# Patient Record
Sex: Male | Born: 1960 | Race: White | Hispanic: No | Marital: Married | Smoking: Never smoker
Health system: Southern US, Community
[De-identification: ages and names within clinical notes are randomized; demographics above are authoritative.]

## PROBLEM LIST (undated history)

## (undated) DIAGNOSIS — R079 Chest pain, unspecified: Secondary | ICD-10-CM

## (undated) DIAGNOSIS — I493 Ventricular premature depolarization: Secondary | ICD-10-CM

## (undated) DIAGNOSIS — E669 Obesity, unspecified: Secondary | ICD-10-CM

## (undated) DIAGNOSIS — R7303 Prediabetes: Secondary | ICD-10-CM

## (undated) DIAGNOSIS — K219 Gastro-esophageal reflux disease without esophagitis: Secondary | ICD-10-CM

## (undated) DIAGNOSIS — Z87442 Personal history of urinary calculi: Secondary | ICD-10-CM

## (undated) DIAGNOSIS — G4730 Sleep apnea, unspecified: Secondary | ICD-10-CM

## (undated) HISTORY — DX: Ventricular premature depolarization: I49.3

## (undated) HISTORY — DX: Sleep apnea, unspecified: G47.30

## (undated) HISTORY — DX: Obesity, unspecified: E66.9

## (undated) HISTORY — DX: Chest pain, unspecified: R07.9

## (undated) HISTORY — PX: OTHER SURGICAL HISTORY: SHX169

## (undated) HISTORY — PX: TONSILLECTOMY: SUR1361

## (undated) HISTORY — DX: Prediabetes: R73.03

## (undated) HISTORY — PX: WISDOM TOOTH EXTRACTION: SHX21

---

## 2008-04-30 DIAGNOSIS — C801 Malignant (primary) neoplasm, unspecified: Secondary | ICD-10-CM

## 2008-04-30 HISTORY — DX: Malignant (primary) neoplasm, unspecified: C80.1

## 2015-10-25 ENCOUNTER — Emergency Department (HOSPITAL_BASED_OUTPATIENT_CLINIC_OR_DEPARTMENT_OTHER)
Admission: EM | Admit: 2015-10-25 | Discharge: 2015-10-25 | Disposition: A | Discharge status: Home / Self Care | Payer: 59 | Attending: Emergency Medicine | Admitting: Emergency Medicine

## 2015-10-25 ENCOUNTER — Emergency Department (HOSPITAL_BASED_OUTPATIENT_CLINIC_OR_DEPARTMENT_OTHER): Payer: 59

## 2015-10-25 ENCOUNTER — Encounter (HOSPITAL_BASED_OUTPATIENT_CLINIC_OR_DEPARTMENT_OTHER): Payer: Self-pay | Admitting: *Deleted

## 2015-10-25 DIAGNOSIS — N200 Calculus of kidney: Secondary | ICD-10-CM | POA: Insufficient documentation

## 2015-10-25 DIAGNOSIS — Z79899 Other long term (current) drug therapy: Secondary | ICD-10-CM | POA: Diagnosis not present

## 2015-10-25 DIAGNOSIS — R109 Unspecified abdominal pain: Secondary | ICD-10-CM | POA: Diagnosis present

## 2015-10-25 HISTORY — DX: Gastro-esophageal reflux disease without esophagitis: K21.9

## 2015-10-25 LAB — URINALYSIS, ROUTINE W REFLEX MICROSCOPIC
BILIRUBIN URINE: NEGATIVE
GLUCOSE, UA: NEGATIVE mg/dL
KETONES UR: NEGATIVE mg/dL
LEUKOCYTES UA: NEGATIVE
NITRITE: NEGATIVE
PH: 6 (ref 5.0–8.0)
PROTEIN: NEGATIVE mg/dL
Specific Gravity, Urine: 1.023 (ref 1.005–1.030)

## 2015-10-25 LAB — URINE MICROSCOPIC-ADD ON

## 2015-10-25 MED ORDER — ONDANSETRON HCL 4 MG/2ML IJ SOLN
4.0000 mg | Freq: Once | INTRAMUSCULAR | Status: AC
Start: 2015-10-25 — End: 2015-10-25
  Administered 2015-10-25: 4 mg via INTRAVENOUS
  Filled 2015-10-25: qty 2

## 2015-10-25 MED ORDER — NAPROXEN 500 MG PO TABS: 500.0000 mg | ORAL_TABLET | Freq: Two times a day (BID) | ORAL | Status: DC

## 2015-10-25 MED ORDER — SODIUM CHLORIDE 0.9 % IV BOLUS (SEPSIS)
1000.0000 mL | Freq: Once | INTRAVENOUS | Status: AC
  Administered 2015-10-25: 1000 mL via INTRAVENOUS

## 2015-10-25 MED ORDER — KETOROLAC TROMETHAMINE 30 MG/ML IJ SOLN
30.0000 mg | Freq: Once | INTRAMUSCULAR | Status: AC
  Administered 2015-10-25: 30 mg via INTRAVENOUS
  Filled 2015-10-25: qty 1

## 2015-10-25 MED ORDER — ONDANSETRON 4 MG PO TBDP: 4.0000 mg | ORAL_TABLET | Freq: Three times a day (TID) | ORAL | Status: DC | PRN

## 2015-10-25 MED ORDER — HYDROCODONE-ACETAMINOPHEN 5-325 MG PO TABS: 1.0000 | ORAL_TABLET | Freq: Four times a day (QID) | ORAL | Status: DC | PRN

## 2015-10-25 NOTE — ED Provider Notes (Signed)
CSN: [id number]     Arrival date & time 10/25/15  1705 History   First MD Initiated Contact with Patient 10/25/15 1720     No chief complaint on file.   (Consider location/radiation/quality/duration/timing/severity/associated sxs/prior Treatment) The history is provided by the patient and medical records. No language interpreter was used.   Keith Franklin is a 55 y.o. male  with a PMH of GERD who presents to the Emergency Department complaining of acute onset of crampy waxing-waning left sided flank pain that began at 13:00 today. Admits to nausea, no vomiting. No fever, back pain, dysuria, abdominal pain. No hx of similar. No medications taken prior to arrival. Patient states no matter which position he tries, he cannot get comfortable.   Past Medical History  Diagnosis Date  . GERD (gastroesophageal reflux disease)    History reviewed. No pertinent past surgical history. No family history on file. Social History  Substance Use Topics  . Smoking status: Never Smoker   . Smokeless tobacco: None  . Alcohol Use: None    Review of Systems  Constitutional: Negative for fever and chills.  HENT: Negative for congestion.   Eyes: Negative for visual disturbance.  Respiratory: Negative for cough and shortness of breath.   Cardiovascular: Negative.   Gastrointestinal: Negative for nausea, vomiting and abdominal pain.  Genitourinary: Positive for flank pain. Negative for dysuria, hematuria, discharge, penile swelling, scrotal swelling, penile pain and testicular pain.  Musculoskeletal: Negative for back pain and neck pain.  Skin: Negative for rash.  Neurological: Negative for headaches.      Allergies  Review of patient's allergies indicates no known allergies.  Home Medications   Prior to Admission medications   Medication Sig Start Date End Date Taking? Authorizing Provider  famotidine (PEPCID) 10 MG tablet Take 10 mg by mouth 2 (two) times daily.   Yes Historical Provider, MD   HYDROcodone-acetaminophen (NORCO/VICODIN) 5-325 MG tablet Take 1 tablet by mouth every 6 (six) hours as needed for severe pain. 10/25/15   Ozella Almond Nikkie Liming, PA-C  naproxen (NAPROSYN) 500 MG tablet Take 1 tablet (500 mg total) by mouth 2 (two) times daily. 10/25/15   Ozella Almond Altamese Deguire, PA-C  ondansetron (ZOFRAN ODT) 4 MG disintegrating tablet Take 1 tablet (4 mg total) by mouth every 8 (eight) hours as needed for nausea or vomiting. 10/25/15   Ozella Almond Annalicia Renfrew, PA-C   BP 127/80 mmHg  Pulse 77  Temp(Src) 97.8 F (36.6 C) (Oral)  Resp 18  Ht 5\' 8"  (1.727 m)  Wt 106.595 kg  BMI 35.74 kg/m2  SpO2 97% Physical Exam  Constitutional: He is oriented to person, place, and time. He appears well-developed and well-nourished.  Alert, pacing around the room, NAD.   HENT:  Head: Normocephalic and atraumatic.  Cardiovascular: Normal rate, regular rhythm, normal heart sounds and intact distal pulses.  Exam reveals no gallop and no friction rub.   No murmur heard. Pulmonary/Chest: Effort normal and breath sounds normal. No respiratory distress. He has no wheezes. He has no rales. He exhibits no tenderness.  Abdominal: Soft. Bowel sounds are normal. He exhibits no distension and no mass. There is no tenderness. There is no rebound and no guarding.  No CVA tenderness. TTP of left flank.  Musculoskeletal: He exhibits no edema.  Neurological: He is alert and oriented to person, place, and time.  Skin: Skin is warm and dry.  Nursing note and vitals reviewed.   ED Course  Procedures (including critical care time) Labs Review Labs Reviewed  URINALYSIS, ROUTINE W REFLEX MICROSCOPIC (NOT AT [facility]) - Abnormal; Notable for the following:    Hgb urine dipstick LARGE (*)    All other components within normal limits  URINE MICROSCOPIC-ADD ON - Abnormal; Notable for the following:    Squamous Epithelial / LPF 0-5 (*)    Bacteria, UA FEW (*)    All other components within normal limits    Imaging  Review Ct Renal Stone Study  10/25/2015  CLINICAL DATA:  Left flank pain starting 13 p.m. today EXAM: CT ABDOMEN AND PELVIS WITHOUT CONTRAST TECHNIQUE: Multidetector CT imaging of the abdomen and pelvis was performed following the standard protocol without IV contrast. COMPARISON:  None. FINDINGS: Lower chest:  Lung bases are unremarkable. Hepatobiliary: Unenhanced liver shows no biliary ductal dilatation. Probable cyst right hepatic lobe measures 9 mm. Gallbladder is contracted without evidence of calcified gallstones. Pancreas: Unenhanced pancreas is unremarkable. Spleen: Unenhanced spleen is unremarkable. Adrenals/Urinary Tract: No adrenal gland mass. There is mild left hydronephrosis and left hydroureter. No nephrolithiasis. No proximal ureteral calcified calculi bilaterally. Axial image 61 there is 3 mm calcified obstructive calculus in left UVJ. The urinary bladder is under distended. Stomach/Bowel: No small bowel obstruction. Normal appendix is noted in axial image 55. No pericecal inflammation. Some colonic stool noted in right colon and cecum. No colitis or diverticulitis. Descending colon and sigmoid colon is empty collapsed. Vascular/Lymphatic: No retroperitoneal or mesenteric adenopathy. No aortic aneurysm. Reproductive: Prostate gland and seminal vesicles are unremarkable. Other: There is tiny umbilical hernia containing fat without evidence of acute complication. Musculoskeletal: No destructive bony lesions are noted. Sagittal images of the spine shows mild degenerative changes lower thoracic lumbar spine. IMPRESSION: 1. Mild left hydronephrosis and left hydroureter. 2. There is 3 mm calcified obstructive calculus  noted in left UVJ. 3. Normal appendix.  No pericecal inflammation. 4. No nephrolithiasis.  No right ureteral calculi. Electronically Signed   By: Lahoma Crocker M.D.   On: 10/25/2015 18:33   I have personally reviewed and evaluated these images and lab results as part of my medical  decision-making.   EKG Interpretation None      MDM   Final diagnoses:  Kidney stone   Keith Franklin presents to ED for acute onset of left flank pain. Benign abdominal exam. Afebrile and hemodynamically stable. Tenderness to palpation of left flank. Symptoms appear consistent with kidney stone. CT and urine ordered. IV fluids, Toradol, Zofran given. UA shows large blood, no concern for infection. CT study shows 3 mm stone in left UVJ.  Patient reevaluated and feels much improved after medication. Informed of CT results. Symptomatic home care instructions discussed. Rx for Naproxen, zofran, and short course pain meds given. PCP follow up recommended. Reasons to return to the ED were also discussed and all questions were answered.  [facility] Amrit Erck, PA-C 10/25/15 2004  Deno Etienne, DO 10/25/15 [phone]

## 2015-10-25 NOTE — Discharge Instructions (Signed)
You have been diagnosed with kidney stones.  Use your pain medication as prescribed and do not operate heavy machinery while on this medication. Note that your pain medication contains Acetaminophen (Tylenol), therefore it is not recommended to take additional Tylenol while on your pain medication. Continue to drink fluids to help you pass the stones. Use Zofran for nausea as directed. Increase hydration. Follow up with ypur primary care physician in regards to your hospital visit.  Return to the ED immediately if you develop fever that persists > 101, uncontrolled pain or vomiting, or other concerns.  Read the instructions below to learn more about kidney stones.    Kidney Stones Kidney stones (ureteral lithiasis) are solid masses that form inside your kidneys. The intense pain is caused by the stone moving through the kidney, ureter, bladder, and urethra (urinary tract). When the stone moves, the ureter starts to spasm around the stone. The stone is usually passed in the urine.   HOME CARE  Drink enough fluids to keep your urine clear or pale yellow. This helps to get the stone out.   Only take medicine as told by your doctor.   Follow up with your doctor as told.   GET HELP RIGHT AWAY IF:   Your pain does not get better with medicine.   You have a fever.   Your pain increases and gets worse over 18 hours.   You have new belly (abdominal) pain.   You feel faint or pass out.   MAKE SURE YOU:   Understand these instructions.   Will watch your condition.   Will get help right away if you are not doing well or get worse.

## 2015-10-25 NOTE — ED Notes (Signed)
C/o right flank pain that is intense since 1300 felt urgency to urinate. States he can urinate but is not urinating as much.

## 2019-07-30 DIAGNOSIS — U071 COVID-19: Secondary | ICD-10-CM

## 2019-07-30 HISTORY — DX: COVID-19: U07.1

## 2019-08-09 DIAGNOSIS — J1282 Pneumonia due to coronavirus disease 2019: Secondary | ICD-10-CM | POA: Insufficient documentation

## 2019-08-10 DIAGNOSIS — Z6838 Body mass index (BMI) 38.0-38.9, adult: Secondary | ICD-10-CM | POA: Diagnosis present

## 2020-09-30 ENCOUNTER — Emergency Department (HOSPITAL_COMMUNITY): Payer: 59

## 2020-09-30 ENCOUNTER — Encounter (HOSPITAL_COMMUNITY): Payer: Self-pay

## 2020-09-30 ENCOUNTER — Encounter (HOSPITAL_COMMUNITY)
Admission: EM | Disposition: A | Discharge status: Home / Self Care | Payer: Self-pay | Source: Home / Self Care | Attending: Thoracic Surgery (Cardiothoracic Vascular Surgery)

## 2020-09-30 ENCOUNTER — Other Ambulatory Visit: Payer: Self-pay

## 2020-09-30 ENCOUNTER — Inpatient Hospital Stay (HOSPITAL_COMMUNITY)
Admission: EM | Admit: 2020-09-30 | Discharge: 2020-10-07 | DRG: 231 | Disposition: A | Discharge status: Home / Self Care | Payer: 59 | Attending: Thoracic Surgery (Cardiothoracic Vascular Surgery) | Admitting: Thoracic Surgery (Cardiothoracic Vascular Surgery)

## 2020-09-30 DIAGNOSIS — G4440 Drug-induced headache, not elsewhere classified, not intractable: Secondary | ICD-10-CM | POA: Diagnosis not present

## 2020-09-30 DIAGNOSIS — Z79899 Other long term (current) drug therapy: Secondary | ICD-10-CM

## 2020-09-30 DIAGNOSIS — Z85828 Personal history of other malignant neoplasm of skin: Secondary | ICD-10-CM | POA: Diagnosis not present

## 2020-09-30 DIAGNOSIS — K219 Gastro-esophageal reflux disease without esophagitis: Secondary | ICD-10-CM | POA: Diagnosis present

## 2020-09-30 DIAGNOSIS — I462 Cardiac arrest due to underlying cardiac condition: Secondary | ICD-10-CM | POA: Diagnosis not present

## 2020-09-30 DIAGNOSIS — Z8249 Family history of ischemic heart disease and other diseases of the circulatory system: Secondary | ICD-10-CM

## 2020-09-30 DIAGNOSIS — I213 ST elevation (STEMI) myocardial infarction of unspecified site: Secondary | ICD-10-CM

## 2020-09-30 DIAGNOSIS — I2121 ST elevation (STEMI) myocardial infarction involving left circumflex coronary artery: Secondary | ICD-10-CM | POA: Diagnosis present

## 2020-09-30 DIAGNOSIS — I472 Ventricular tachycardia: Secondary | ICD-10-CM | POA: Diagnosis not present

## 2020-09-30 DIAGNOSIS — Z6841 Body Mass Index (BMI) 40.0 and over, adult: Secondary | ICD-10-CM

## 2020-09-30 DIAGNOSIS — R0602 Shortness of breath: Secondary | ICD-10-CM

## 2020-09-30 DIAGNOSIS — Z7982 Long term (current) use of aspirin: Secondary | ICD-10-CM | POA: Diagnosis not present

## 2020-09-30 DIAGNOSIS — Z8616 Personal history of COVID-19: Secondary | ICD-10-CM

## 2020-09-30 DIAGNOSIS — E785 Hyperlipidemia, unspecified: Secondary | ICD-10-CM | POA: Diagnosis present

## 2020-09-30 DIAGNOSIS — R739 Hyperglycemia, unspecified: Secondary | ICD-10-CM | POA: Diagnosis present

## 2020-09-30 DIAGNOSIS — I150 Renovascular hypertension: Secondary | ICD-10-CM

## 2020-09-30 DIAGNOSIS — I2510 Atherosclerotic heart disease of native coronary artery without angina pectoris: Secondary | ICD-10-CM

## 2020-09-30 DIAGNOSIS — Z809 Family history of malignant neoplasm, unspecified: Secondary | ICD-10-CM

## 2020-09-30 DIAGNOSIS — I252 Old myocardial infarction: Secondary | ICD-10-CM

## 2020-09-30 DIAGNOSIS — Z6838 Body mass index (BMI) 38.0-38.9, adult: Secondary | ICD-10-CM | POA: Diagnosis present

## 2020-09-30 DIAGNOSIS — D62 Acute posthemorrhagic anemia: Secondary | ICD-10-CM | POA: Diagnosis not present

## 2020-09-30 DIAGNOSIS — Z9861 Coronary angioplasty status: Secondary | ICD-10-CM

## 2020-09-30 DIAGNOSIS — E8770 Fluid overload, unspecified: Secondary | ICD-10-CM | POA: Diagnosis not present

## 2020-09-30 DIAGNOSIS — E669 Obesity, unspecified: Secondary | ICD-10-CM | POA: Diagnosis present

## 2020-09-30 DIAGNOSIS — T463X5A Adverse effect of coronary vasodilators, initial encounter: Secondary | ICD-10-CM | POA: Diagnosis not present

## 2020-09-30 DIAGNOSIS — Z09 Encounter for follow-up examination after completed treatment for conditions other than malignant neoplasm: Secondary | ICD-10-CM

## 2020-09-30 DIAGNOSIS — I219 Acute myocardial infarction, unspecified: Secondary | ICD-10-CM

## 2020-09-30 DIAGNOSIS — Z951 Presence of aortocoronary bypass graft: Secondary | ICD-10-CM

## 2020-09-30 DIAGNOSIS — I214 Non-ST elevation (NSTEMI) myocardial infarction: Secondary | ICD-10-CM | POA: Diagnosis not present

## 2020-09-30 DIAGNOSIS — D72828 Other elevated white blood cell count: Secondary | ICD-10-CM | POA: Diagnosis not present

## 2020-09-30 HISTORY — DX: Acute myocardial infarction, unspecified: I21.9

## 2020-09-30 HISTORY — PX: LEFT HEART CATH AND CORONARY ANGIOGRAPHY: CATH118249

## 2020-09-30 HISTORY — PX: CORONARY/GRAFT ACUTE MI REVASCULARIZATION: CATH118305

## 2020-09-30 LAB — LIPID PANEL
Cholesterol: 181 mg/dL (ref 0–200)
HDL: 42 mg/dL (ref >40)
LDL Cholesterol: 131 mg/dL — ABNORMAL HIGH (ref 0–99)
Total CHOL/HDL Ratio: 4.3 RATIO
Triglycerides: 41 mg/dL (ref <150)
VLDL: 8 mg/dL (ref 0–40)

## 2020-09-30 LAB — HEPATIC FUNCTION PANEL
ALT: 24 U/L (ref 0–44)
AST: 21 U/L (ref 15–41)
Albumin: 4.1 g/dL (ref 3.5–5.0)
Alkaline Phosphatase: 54 U/L (ref 38–126)
Bilirubin, Direct: 0.1 mg/dL (ref 0.0–0.2)
Indirect Bilirubin: 0.4 mg/dL (ref 0.3–0.9)
Total Bilirubin: 0.5 mg/dL (ref 0.3–1.2)
Total Protein: 7 g/dL (ref 6.5–8.1)

## 2020-09-30 LAB — PROTIME-INR
INR: 1 (ref 0.8–1.2)
Prothrombin Time: 13.5 seconds (ref 11.4–15.2)

## 2020-09-30 LAB — RESP PANEL BY RT-PCR (FLU A&B, COVID) ARPGX2
Influenza A by PCR: NEGATIVE
Influenza B by PCR: NEGATIVE
SARS Coronavirus 2 by RT PCR: NEGATIVE

## 2020-09-30 LAB — CBC
HCT: 46.2 % (ref 39.0–52.0)
Hemoglobin: 15.8 g/dL (ref 13.0–17.0)
MCH: 31.3 pg (ref 26.0–34.0)
MCHC: 34.2 g/dL (ref 30.0–36.0)
MCV: 91.7 fL (ref 80.0–100.0)
Platelets: 271 10*3/uL (ref 150–400)
RBC: 5.04 MIL/uL (ref 4.22–5.81)
RDW: 12.9 % (ref 11.5–15.5)
WBC: 10.1 10*3/uL (ref 4.0–10.5)
nRBC: 0 % (ref 0.0–0.2)

## 2020-09-30 LAB — BASIC METABOLIC PANEL
Anion gap: 10 (ref 5–15)
BUN: 18 mg/dL (ref 6–20)
CO2: 22 mmol/L (ref 22–32)
Calcium: 9.1 mg/dL (ref 8.9–10.3)
Chloride: 105 mmol/L (ref 98–111)
Creatinine, Ser: 1.1 mg/dL (ref 0.61–1.24)
GFR, Estimated: >60 mL/min (ref >60)
Glucose, Bld: 173 mg/dL — ABNORMAL HIGH (ref 70–99)
Potassium: 3.6 mmol/L (ref 3.5–5.1)
Sodium: 137 mmol/L (ref 135–145)

## 2020-09-30 LAB — TROPONIN I (HIGH SENSITIVITY)
Troponin I (High Sensitivity): 36 ng/L — ABNORMAL HIGH (ref <18)
Troponin I (High Sensitivity): 65 ng/L — ABNORMAL HIGH (ref <18)
Troponin I (High Sensitivity): 9368 ng/L (ref <18)

## 2020-09-30 LAB — HIV ANTIBODY (ROUTINE TESTING W REFLEX): HIV Screen 4th Generation wRfx: NONREACTIVE

## 2020-09-30 LAB — GLUCOSE, CAPILLARY
Glucose-Capillary: 125 mg/dL — ABNORMAL HIGH (ref 70–99)
Glucose-Capillary: 134 mg/dL — ABNORMAL HIGH (ref 70–99)

## 2020-09-30 LAB — MRSA PCR SCREENING: MRSA by PCR: NEGATIVE

## 2020-09-30 LAB — APTT: aPTT: 81 seconds — ABNORMAL HIGH (ref 24–36)

## 2020-09-30 LAB — POCT ACTIVATED CLOTTING TIME: Activated Clotting Time: 327 seconds

## 2020-09-30 SURGERY — LEFT HEART CATH AND CORONARY ANGIOGRAPHY
Anesthesia: LOCAL

## 2020-09-30 MED ORDER — HEPARIN (PORCINE) IN NACL 1000-0.9 UT/500ML-% IV SOLN
INTRAVENOUS | Status: DC | PRN
  Administered 2020-09-30 (×2): 500 mL

## 2020-09-30 MED ORDER — MAGNESIUM SULFATE 2 GM/50ML IV SOLN
2.0000 g | Freq: Once | INTRAVENOUS | Status: AC
  Administered 2020-09-30: 2 g via INTRAVENOUS
  Filled 2020-09-30: qty 50

## 2020-09-30 MED ORDER — MORPHINE SULFATE (PF) 4 MG/ML IV SOLN
4.0000 mg | Freq: Once | INTRAVENOUS | Status: AC
  Administered 2020-09-30: 4 mg via INTRAVENOUS
  Filled 2020-09-30: qty 1

## 2020-09-30 MED ORDER — NITROGLYCERIN IN D5W 200-5 MCG/ML-% IV SOLN
0.0000 ug/min | INTRAVENOUS | Status: DC
  Administered 2020-09-30: 5 ug/min via INTRAVENOUS
  Administered 2020-10-01: 45 ug/min via INTRAVENOUS
  Filled 2020-09-30 (×2): qty 250

## 2020-09-30 MED ORDER — TIROFIBAN (AGGRASTAT) BOLUS VIA INFUSION
INTRAVENOUS | Status: DC | PRN
  Administered 2020-09-30: 2835 ug via INTRAVENOUS

## 2020-09-30 MED ORDER — INSULIN ASPART 100 UNIT/ML IJ SOLN: 0.0000 [IU] | Freq: Three times a day (TID) | INTRAMUSCULAR | Status: DC

## 2020-09-30 MED ORDER — HEPARIN SODIUM (PORCINE) 5000 UNIT/ML IJ SOLN
4000.0000 [IU] | Freq: Once | INTRAMUSCULAR | Status: AC
  Administered 2020-09-30: 4000 [IU] via INTRAVENOUS
  Filled 2020-09-30: qty 1

## 2020-09-30 MED ORDER — ASPIRIN EC 81 MG PO TBEC
81.0000 mg | DELAYED_RELEASE_TABLET | Freq: Every day | ORAL | Status: DC
  Administered 2020-10-01 – 2020-10-02 (×2): 81 mg via ORAL
  Filled 2020-09-30 (×2): qty 1

## 2020-09-30 MED ORDER — POTASSIUM CHLORIDE CRYS ER 20 MEQ PO TBCR
40.0000 meq | EXTENDED_RELEASE_TABLET | Freq: Once | ORAL | Status: AC
  Administered 2020-09-30: 40 meq via ORAL
  Filled 2020-09-30: qty 2

## 2020-09-30 MED ORDER — LIDOCAINE HCL (PF) 1 % IJ SOLN
INTRAMUSCULAR | Status: AC
  Filled 2020-09-30: qty 30

## 2020-09-30 MED ORDER — IOHEXOL 350 MG/ML SOLN
INTRAVENOUS | Status: DC | PRN
  Administered 2020-09-30: 100 mL

## 2020-09-30 MED ORDER — NITROGLYCERIN 0.4 MG SL SUBL: 0.4000 mg | SUBLINGUAL_TABLET | SUBLINGUAL | Status: DC | PRN

## 2020-09-30 MED ORDER — SODIUM CHLORIDE 0.9% FLUSH: 3.0000 mL | INTRAVENOUS | Status: DC | PRN

## 2020-09-30 MED ORDER — SODIUM CHLORIDE 0.9 % IV SOLN: 250.0000 mL | INTRAVENOUS | Status: DC | PRN

## 2020-09-30 MED ORDER — TIROFIBAN HCL IN NACL 5-0.9 MG/100ML-% IV SOLN
0.1500 ug/kg/min | INTRAVENOUS | Status: AC
  Administered 2020-09-30 – 2020-10-02 (×9): 0.15 ug/kg/min via INTRAVENOUS
  Filled 2020-09-30 (×10): qty 100

## 2020-09-30 MED ORDER — ONDANSETRON HCL 4 MG/2ML IJ SOLN: 4.0000 mg | Freq: Four times a day (QID) | INTRAMUSCULAR | Status: DC | PRN

## 2020-09-30 MED ORDER — PANTOPRAZOLE SODIUM 40 MG PO TBEC
40.0000 mg | DELAYED_RELEASE_TABLET | Freq: Every day | ORAL | Status: DC
  Administered 2020-10-01 – 2020-10-02 (×2): 40 mg via ORAL
  Filled 2020-09-30 (×2): qty 1

## 2020-09-30 MED ORDER — TIROFIBAN HCL IN NACL 5-0.9 MG/100ML-% IV SOLN
INTRAVENOUS | Status: AC
  Filled 2020-09-30: qty 100

## 2020-09-30 MED ORDER — ASPIRIN 81 MG PO CHEW: 81.0000 mg | CHEWABLE_TABLET | Freq: Every day | ORAL | Status: DC

## 2020-09-30 MED ORDER — NITROGLYCERIN 0.4 MG SL SUBL
0.4000 mg | SUBLINGUAL_TABLET | SUBLINGUAL | Status: DC | PRN
  Administered 2020-09-30: 0.4 mg via SUBLINGUAL
  Filled 2020-09-30: qty 1

## 2020-09-30 MED ORDER — VERAPAMIL HCL 2.5 MG/ML IV SOLN
INTRAVENOUS | Status: AC
  Filled 2020-09-30: qty 2

## 2020-09-30 MED ORDER — HEPARIN SODIUM (PORCINE) 1000 UNIT/ML IJ SOLN
INTRAMUSCULAR | Status: AC
  Filled 2020-09-30: qty 1

## 2020-09-30 MED ORDER — ATORVASTATIN CALCIUM 80 MG PO TABS
80.0000 mg | ORAL_TABLET | Freq: Every day | ORAL | Status: DC
  Administered 2020-10-01 – 2020-10-02 (×2): 80 mg via ORAL
  Filled 2020-09-30 (×2): qty 1

## 2020-09-30 MED ORDER — SODIUM CHLORIDE 0.9 % WEIGHT BASED INFUSION: 1.0000 mL/kg/h | INTRAVENOUS | Status: AC

## 2020-09-30 MED ORDER — NITROGLYCERIN 1 MG/10 ML FOR IR/CATH LAB
INTRA_ARTERIAL | Status: DC | PRN
  Administered 2020-09-30: 200 ug via INTRACORONARY

## 2020-09-30 MED ORDER — METOPROLOL TARTRATE 12.5 MG HALF TABLET
25.0000 mg | ORAL_TABLET | Freq: Two times a day (BID) | ORAL | Status: DC
  Administered 2020-09-30 – 2020-10-02 (×4): 25 mg via ORAL
  Filled 2020-09-30 (×4): qty 2

## 2020-09-30 MED ORDER — LIDOCAINE HCL (PF) 1 % IJ SOLN
INTRAMUSCULAR | Status: DC | PRN
  Administered 2020-09-30: 2 mL

## 2020-09-30 MED ORDER — TIROFIBAN HCL IN NACL 5-0.9 MG/100ML-% IV SOLN
INTRAVENOUS | Status: AC | PRN
  Administered 2020-09-30: 0.15 ug/kg/min via INTRAVENOUS

## 2020-09-30 MED ORDER — VERAPAMIL HCL 2.5 MG/ML IV SOLN
INTRAVENOUS | Status: DC | PRN
  Administered 2020-09-30: 10 mL via INTRA_ARTERIAL

## 2020-09-30 MED ORDER — SODIUM CHLORIDE 0.9 % IV SOLN: INTRAVENOUS | Status: DC

## 2020-09-30 MED ORDER — HEPARIN (PORCINE) 25000 UT/250ML-% IV SOLN
1500.0000 [IU]/h | INTRAVENOUS | Status: DC
  Administered 2020-10-01: 1200 [IU]/h via INTRAVENOUS
  Administered 2020-10-02 – 2020-10-03 (×2): 1500 [IU]/h via INTRAVENOUS
  Filled 2020-09-30 (×5): qty 250

## 2020-09-30 MED ORDER — HEPARIN (PORCINE) IN NACL 1000-0.9 UT/500ML-% IV SOLN
INTRAVENOUS | Status: AC
  Filled 2020-09-30: qty 1000

## 2020-09-30 MED ORDER — HEPARIN SODIUM (PORCINE) 1000 UNIT/ML IJ SOLN
INTRAMUSCULAR | Status: DC | PRN
  Administered 2020-09-30: 10000 [IU] via INTRAVENOUS

## 2020-09-30 MED ORDER — ASPIRIN 81 MG PO CHEW: 324.0000 mg | CHEWABLE_TABLET | Freq: Once | ORAL | Status: DC

## 2020-09-30 MED ORDER — MORPHINE SULFATE (PF) 2 MG/ML IV SOLN
2.0000 mg | INTRAVENOUS | Status: DC | PRN
  Administered 2020-09-30 – 2020-10-01 (×4): 2 mg via INTRAVENOUS
  Filled 2020-09-30 (×5): qty 1

## 2020-09-30 MED ORDER — SODIUM CHLORIDE 0.9% FLUSH
3.0000 mL | Freq: Two times a day (BID) | INTRAVENOUS | Status: DC
  Administered 2020-10-01 – 2020-10-02 (×5): 3 mL via INTRAVENOUS

## 2020-09-30 MED ORDER — ACETAMINOPHEN 325 MG PO TABS
650.0000 mg | ORAL_TABLET | ORAL | Status: DC | PRN
  Administered 2020-10-01 – 2020-10-02 (×4): 650 mg via ORAL
  Filled 2020-09-30 (×5): qty 2

## 2020-09-30 SURGICAL SUPPLY — 14 items
BALLN SAPPHIRE 2.5X12 (BALLOONS) ×2
BALLOON SAPPHIRE 2.5X12 (BALLOONS) ×1 IMPLANT
CATH 5FR JL3.5 JR4 ANG PIG MP (CATHETERS) ×2 IMPLANT
CATH LAUNCHER 6FR EBU3.5 (CATHETERS) ×2 IMPLANT
DEVICE RAD COMP TR BAND LRG (VASCULAR PRODUCTS) ×2 IMPLANT
GLIDESHEATH SLEND SS 6F .021 (SHEATH) ×2 IMPLANT
GUIDEWIRE INQWIRE 1.5J.035X260 (WIRE) ×1 IMPLANT
INQWIRE 1.5J .035X260CM (WIRE) ×2
KIT ENCORE 26 ADVANTAGE (KITS) ×2 IMPLANT
KIT HEART LEFT (KITS) ×2 IMPLANT
PACK CARDIAC CATHETERIZATION (CUSTOM PROCEDURE TRAY) ×2 IMPLANT
TRANSDUCER W/STOPCOCK (MISCELLANEOUS) ×2 IMPLANT
TUBING CIL FLEX 10 FLL-RA (TUBING) ×2 IMPLANT
WIRE ASAHI PROWATER 180CM (WIRE) ×2 IMPLANT

## 2020-09-30 NOTE — ED Notes (Signed)
Pt states his chest pain is better, still there, but has eased off.

## 2020-09-30 NOTE — Progress Notes (Signed)
ANTICOAGULATION CONSULT NOTE - Initial Consult  Pharmacy Consult for Heparin & Tirofiban Indication: chest pain/ACS  No Known Allergies  Patient Measurements: Height: 5\' 8"  (172.7 cm) Weight: 113.4 kg (250 lb) IBW/kg (Calculated) : 68.4 Heparin Dosing Weight: 93.9 kg  Vital Signs: Temp: 98.1 F (36.7 C) (06/03 1347) Temp Source: Oral (06/03 1347) BP: 142/86 (06/03 1704) Pulse Rate: 82 (06/03 1704)  Labs: Recent Labs    09/30/20 1403  HGB 15.8  HCT 46.2  PLT 271  CREATININE 1.10  TROPONINIHS 36*    Estimated Creatinine Clearance: 87.3 mL/min (by C-G formula based on SCr of 1.1 mg/dL).   Medical History: Past Medical History:  Diagnosis Date  . Cancer ([place])   . GERD (gastroesophageal reflux disease)     Medications:  Medications Prior to Admission  Medication Sig Dispense Refill Last Dose  . aspirin EC 81 MG tablet Take 324 mg by mouth daily as needed for mild pain. Swallow whole.   09/30/2020 at Unknown time  . famotidine (PEPCID) 10 MG tablet Take 10 mg by mouth daily.   09/29/2020 at Unknown time  . naproxen (NAPROSYN) 500 MG tablet Take 1 tablet (500 mg total) by mouth 2 (two) times daily. (Patient taking differently: Take 500 mg by mouth daily.) 30 tablet 0 09/29/2020 at Unknown time  . Simethicone (GAS-X PO) Take 2 tablets by mouth daily as needed (bloating).   09/30/2020 at Unknown time  . HYDROcodone-acetaminophen (NORCO/VICODIN) 5-325 MG tablet Take 1 tablet by mouth every 6 (six) hours as needed for severe pain. (Patient not taking: No sig reported) 12 tablet 0 Not Taking at Unknown time  . ondansetron (ZOFRAN ODT) 4 MG disintegrating tablet Take 1 tablet (4 mg total) by mouth every 8 (eight) hours as needed for nausea or vomiting. (Patient not taking: No sig reported) 20 tablet 0 Not Taking at Unknown time   Infusions:  . sodium chloride    . [START ON 10/01/2020] sodium chloride    . sodium chloride    . [START ON 10/01/2020] heparin    . nitroGLYCERIN 30 mcg/min  (09/30/20 1759)  . tirofiban 0.15 mcg/kg/min (09/30/20 1812)    Assessment: 59 yom that presented to [facility] ED with acute onset chest pain. Found to have ST elevations and Code Stemi activated and transported to [facility] cath lab. 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  Tirofiban initiated in cath lab. Consult has been placed to continue for 48h for CABG evaluation.  Heparin to start 8 hours post-sheath removal.  Goal of Therapy:  Heparin level 0.3-0.5 units/ml while on tirofiban Monitor platelets by anticoagulation protocol: Yes   Plan:  Start heparin infusion at 1200 units/hr on 10/01/20 @ 0100 (8 hours post-sheath removal) Check 6 heparin level Continue tirofiban until CT surgery evaluates for CABG  Lorelei Pont, PharmD, BCPS Emergency Medicine Clinical Pharmacist 09/30/2020 6:25 PM\

## 2020-09-30 NOTE — ED Triage Notes (Addendum)
Patient c/o mid chest pressure, SOB, radiation of pain into the left arm and nausea x 20 minutes.  Patient states he took ASA 81 mg x 4 and 2 gas-X tabs prior to arrival to the ED

## 2020-09-30 NOTE — ED Provider Notes (Signed)
Emergency Medicine Provider Triage Evaluation Note  Keith Franklin , a 60 y.o. male  was evaluated in triage.  Pt complains of cp.  Review of Systems  Positive: Cp, sob, nausea, arm pain Negative: Fever, cough, abd pain  Physical Exam  There were no vitals taken for this visit. Gen:   Awake, no distress   Resp:  Normal effort  MSK:   Moves extremities without difficulty  Other:    Medical Decision Making  Medically screening exam initiated at 1:49 PM.  Appropriate orders placed.  Keith Franklin was informed that the remainder of the evaluation will be completed by another provider, this initial triage assessment does not replace that evaluation, and the importance of remaining in the ED until their evaluation is complete.  Pt developed chest pain, sob, nausea and arm pain approximately 1 hr ago while doing light housework.  Took ASA and came here.  Report feeling a bit better.  Denies any signifcant cardiac hx.    Domenic Moras, PA-C 09/30/20 1351    Milton Ferguson, MD 09/30/20 2253

## 2020-09-30 NOTE — H&P (Signed)
Cardiology Admission History and Physical:   Patient ID: Keith Franklin MRN: [id number]; DOB: [birth year]   Admission date: 09/30/2020  PCP:  Patient, No Pcp Per (Inactive)   [facility] Providers Cardiologist:  Peter Martinique, MD    Chief Complaint:  STEMI  Patient Profile:   Keith Franklin is a 60 y.o. male with GERD who is being seen 09/30/2020 for the evaluation of inferior STEMI.  History of Present Illness:   Keith Franklin has not prior cardiac history presented with [facility] with onset of chest pain about 1 hr PTA. CP was associated with SOB, nausea, and arm pain. He was doing light housework. He took ASA and came to [facility]. On arrival, EKG was nonischemic. He was significantly hypertensive in the 200s. Nitro did not relieve his pain, but did reduce his BP.  CP was still 10/10. HS troponin 36. EKG was repeated and showed mild ST elevation in inferior leads concerning for evolving ischemia. Case was discussed with our service and CODE STEMI was activated. He was emergently transferred from [facility] directly to [facility] cath lab.    He is a never smoker, not DM, but obese. Of note, hyperglycemic in the ER.  He is not currently on [place], no recent surgeries.   On arrival, he describes chest pain in his left chest that radiates down his left arm. CP associated with diaphoresis. He is active and walks. 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 He has a wife and 2 daughters. Heart disease in his father.    Past Medical History:  Diagnosis Date  . Cancer ([place])   . GERD (gastroesophageal reflux disease)     Past Surgical History:  Procedure Laterality Date  . skin cancer removal       Medications Prior to Admission: Prior to Admission medications   Medication Sig Start Date End Date Taking? Authorizing Provider  aspirin EC 81 MG tablet Take 324 mg by mouth daily as needed for mild pain. Swallow whole.   Yes [provider]  famotidine (PEPCID) 10 MG tablet Take 10 mg by mouth daily.   Yes  [provider]  naproxen (NAPROSYN) 500 MG tablet Take 1 tablet (500 mg total) by mouth 2 (two) times daily. Patient taking differently: Take 500 mg by mouth daily. 10/25/15  Yes Ward, Ozella Almond, PA-C  Simethicone (GAS-X PO) Take 2 tablets by mouth daily as needed (bloating).   Yes [provider]  HYDROcodone-acetaminophen (NORCO/VICODIN) 5-325 MG tablet Take 1 tablet by mouth every 6 (six) hours as needed for severe pain. Patient not taking: No sig reported 10/25/15   Ward, Ozella Almond, PA-C  ondansetron (ZOFRAN ODT) 4 MG disintegrating tablet Take 1 tablet (4 mg total) by mouth every 8 (eight) hours as needed for nausea or vomiting. Patient not taking: No sig reported 10/25/15   Ward, Ozella Almond, PA-C     Allergies:   No Known Allergies  Social History:   Social History   Socioeconomic History  . Marital status: Married    Spouse name: Not on file  . Number of children: Not on file  . Years of education: Not on file  . Highest education level: Not on file  Occupational History  . Not on file  Tobacco Use  . Smoking status: Never Smoker  . Smokeless tobacco: Never Used  Vaping Use  . Vaping Use: Never used  Substance and Sexual Activity  . Alcohol use: Yes  . Drug use: Never  . Sexual activity: Not on file  Other Topics Concern  . Not on file  Social History Narrative  . Not on file   Social Determinants of Health   Financial Resource Strain: Not on file  Food Insecurity: Not on file  Transportation Needs: Not on file  Physical Activity: Not on file  Stress: Not on file  Social Connections: Not on file  Intimate Partner Violence: Not on file    Family History:   The patient's family history includes Cancer in his mother; Heart attack in his father.    ROS:  Please see the history of present illness.  All other ROS reviewed and negative.     Physical Exam/Data:   Vitals:   09/30/20 1530 09/30/20 1541 09/30/20 1545 09/30/20 1620  BP:  (!) 171/92 (!) 159/97 (!) 150/74   Pulse: 66 69 71   Resp: 14 (!) 24 20   Temp:      TempSrc:      SpO2: 100% 100% 100% 98%  Weight:      Height:       No intake or output data in the 24 hours ending 09/30/20 1635 Last 3 Weights 09/30/2020 10/25/2015  Weight (lbs) 250 lb 235 lb  Weight (kg) 113.399 kg 106.595 kg     Body mass index is 38.01 kg/m.  General:  Well nourished, well developed, in no acute distress Neck: no JVD Cardiac:  normal S1, S2; RRR; no murmur  Lungs:  clear to auscultation bilaterally, no wheezing, rhonchi or rales  Abd: soft, nontender, no hepatomegaly  Ext: no edema Musculoskeletal:  No deformities, BUE and BLE strength normal and equal Skin: warm and dry  Neuro:  CNs 2-12 intact, no focal abnormalities noted Psych:  Normal affect    EKG:  The ECG that was done was personally reviewed and demonstrates sinus rhythm with HR 68, ST elevation inferior leads and TWI AVL  Relevant CV Studies:  Left heart cath pending final results  Laboratory Data:  High Sensitivity Troponin:   Recent Labs  Lab 09/30/20 1403  TROPONINIHS 36*      Chemistry Recent Labs  Lab 09/30/20 1403  NA 137  K 3.6  CL 105  CO2 22  GLUCOSE 173*  BUN 18  CREATININE 1.10  CALCIUM 9.1  GFRNONAA >60  ANIONGAP 10    No results for input(s): PROT, ALBUMIN, AST, ALT, ALKPHOS, BILITOT in the last 168 hours. Hematology Recent Labs  Lab 09/30/20 1403  WBC 10.1  RBC 5.04  HGB 15.8  HCT 46.2  MCV 91.7  MCH 31.3  MCHC 34.2  RDW 12.9  PLT 271   BNPNo results for input(s): BNP, PROBNP in the last 168 hours.  DDimer No results for input(s): DDIMER in the last 168 hours.   Radiology/Studies:  DG Chest Port 1 View  Result Date: 09/30/2020 CLINICAL DATA:  Chest pain and shortness of breath EXAM: PORTABLE CHEST 1 VIEW COMPARISON:  None. FINDINGS: Lungs are clear. Heart is slightly enlarged with pulmonary vascularity normal. No adenopathy. No pneumothorax. No bone lesions.  IMPRESSION: Cardiac prominence.  No edema or airspace opacity. Electronically Signed   By: Lowella Grip III M.D.   On: 09/30/2020 14:24    Assessment and Plan:   Inferior STEMI Pt presented with symptoms concerning for angina. Initial hs troponin mildly elevated and first EKG without ischemia. Pt continued to have chest pain and repeat EKG showed what appeared to be evolving inferior ischemia. Pt was taken emergently to [facility] cath lab for definitive angiography and  revascularization. On arrival, he continued to describe chest pain.    Risk factor management Obtain fasting lipids and A1c tomorrow morning.  Echo pending.    Hypertension No history of HTN, but significantly hypertensive on arrival. Will continue to follow.    Hyperglycemia - will check A1c - consider SSI   Admit to cardiology.   Risk Assessment/Risk Scores:   TIMI Risk Score for ST  Elevation MI:   The patient's TIMI risk score is 1, which indicates a 1.6% risk of all cause mortality at 30 days. {  Severity of Illness: The appropriate patient status for this patient is INPATIENT. Inpatient status is judged to be reasonable and necessary in order to provide the required intensity of service to ensure the patient's safety. The patient's presenting symptoms, physical exam findings, and initial radiographic and laboratory data in the context of their chronic comorbidities is felt to place them at high risk for further clinical deterioration. Furthermore, it is not anticipated that the patient will be medically stable for discharge from the hospital within 2 midnights of admission. The following factors support the patient status of inpatient.   " The patient's presenting symptoms include [place]. " The worrisome physical exam findings include [place]. " The initial radiographic and laboratory data are worrisome because of STEMI. " The chronic co-morbidities include obesity.   * I certify that at the point of admission it  is my clinical judgment that the patient will require inpatient hospital care spanning beyond 2 midnights from the point of admission due to high intensity of service, high risk for further deterioration and high frequency of surveillance required.*    For questions or updates, please contact [place] Please consult [email or web address] for contact info under     Signed, Ledora Bottcher, [place]  09/30/2020 4:35 PM

## 2020-09-30 NOTE — ED Provider Notes (Signed)
[facility] DEPT Provider Note   CSN: [id number] Arrival date & time: 09/30/20  1343     History Chief Complaint  Patient presents with  . Chest Pain  . Shortness of Breath    Keith Franklin is a 60 y.o. male.  Patient states that he started at around 115 today with chest pressure shortness of breath sweating and pain radiating down his left arm.  Patient states he has no history of heart problems and this is not happened before.  Patient states he took aspirin with self  The history is provided by the patient and medical records. No language interpreter was used.  Chest Pain Pain location:  L chest Pain quality: aching   Radiates to: Left arm. Pain severity:  Moderate Onset quality:  Sudden Timing:  Constant Progression:  Worsening Chronicity:  New Context: not breathing   Associated symptoms: shortness of breath   Associated symptoms: no abdominal pain, no back pain, no cough, no fatigue and no headache   Shortness of Breath Associated symptoms: chest pain   Associated symptoms: no abdominal pain, no cough, no headaches and no rash        Past Medical History:  Diagnosis Date  . Cancer ([place])   . GERD (gastroesophageal reflux disease)     There are no problems to display for this patient.   Past Surgical History:  Procedure Laterality Date  . skin cancer removal         Family History  Problem Relation Age of Onset  . Cancer Mother   . Heart attack Father     Social History   Tobacco Use  . Smoking status: Never Smoker  . Smokeless tobacco: Never Used  Vaping Use  . Vaping Use: Never used  Substance Use Topics  . Alcohol use: Yes  . Drug use: Never    Home Medications Prior to Admission medications   Medication Sig Start Date End Date Taking? Authorizing Provider  aspirin EC 81 MG tablet Take 324 mg by mouth daily as needed for mild pain. Swallow whole.   Yes [provider]  famotidine (PEPCID) 10 MG  tablet Take 10 mg by mouth daily.   Yes [provider]  naproxen (NAPROSYN) 500 MG tablet Take 1 tablet (500 mg total) by mouth 2 (two) times daily. Patient taking differently: Take 500 mg by mouth daily. 10/25/15  Yes Ward, Ozella Almond, PA-C  Simethicone (GAS-X PO) Take 2 tablets by mouth daily as needed (bloating).   Yes [provider]  HYDROcodone-acetaminophen (NORCO/VICODIN) 5-325 MG tablet Take 1 tablet by mouth every 6 (six) hours as needed for severe pain. Patient not taking: No sig reported 10/25/15   Ward, Ozella Almond, PA-C  ondansetron (ZOFRAN ODT) 4 MG disintegrating tablet Take 1 tablet (4 mg total) by mouth every 8 (eight) hours as needed for nausea or vomiting. Patient not taking: No sig reported 10/25/15   Ward, Ozella Almond, PA-C    Allergies    Patient has no known allergies.  Review of Systems   Review of Systems  Constitutional: Negative for appetite change and fatigue.  HENT: Negative for congestion, ear discharge and sinus pressure.   Eyes: Negative for discharge.  Respiratory: Positive for shortness of breath. Negative for cough.   Cardiovascular: Positive for chest pain.  Gastrointestinal: Negative for abdominal pain and diarrhea.  Genitourinary: Negative for frequency and hematuria.  Musculoskeletal: Negative for back pain.  Skin: Negative for rash.  Neurological: Negative for seizures  and headaches.  Psychiatric/Behavioral: Negative for hallucinations.    Physical Exam Updated Vital Signs BP (!) 169/92   Pulse 71   Temp 98.1 F (36.7 C) (Oral)   Resp 16   Ht 5\' 8"  (1.727 m)   Wt 113.4 kg   SpO2 100%   BMI 38.01 kg/m   Physical Exam Vitals and nursing note reviewed.  Constitutional:      Appearance: He is well-developed.  HENT:     Head: Normocephalic.     Nose: Nose normal.  Eyes:     General: No scleral icterus.    Conjunctiva/sclera: Conjunctivae normal.  Neck:     Thyroid: No thyromegaly.  Cardiovascular:      Rate and Rhythm: Normal rate and regular rhythm.     Heart sounds: No murmur heard. No friction rub. No gallop.   Pulmonary:     Breath sounds: No stridor. No wheezing or rales.  Chest:     Chest wall: No tenderness.  Abdominal:     General: There is no distension.     Tenderness: There is no abdominal tenderness. There is no rebound.  Musculoskeletal:        General: Normal range of motion.     Cervical back: Neck supple.  Lymphadenopathy:     Cervical: No cervical adenopathy.  Skin:    Findings: No erythema or rash.  Neurological:     Mental Status: He is alert and oriented to person, place, and time.     Motor: No abnormal muscle tone.     Coordination: Coordination normal.  Psychiatric:        Behavior: Behavior normal.     ED Results / Procedures / Treatments   Labs (all labs ordered are listed, but only abnormal results are displayed) Labs Reviewed  BASIC METABOLIC PANEL - Abnormal; Notable for the following components:      Result Value   Glucose, Bld 173 (*)    All other components within normal limits  TROPONIN I (HIGH SENSITIVITY) - Abnormal; Notable for the following components:   Troponin I (High Sensitivity) 36 (*)    All other components within normal limits  RESP PANEL BY RT-PCR (FLU A&B, COVID) ARPGX2  CBC  HEPATIC FUNCTION PANEL  HEMOGLOBIN A1C  PROTIME-INR  APTT  LIPID PANEL  TROPONIN I (HIGH SENSITIVITY)    EKG EKG Interpretation  Date/Time:  Friday September 30 2020 13:47:51 EDT Ventricular Rate:  97 PR Interval:  165 QRS Duration: 89 QT Interval:  357 QTC Calculation: 454 R Axis:   21 Text Interpretation: Sinus rhythm Low voltage, precordial leads Consider anterior infarct Nonspecific repol abnormality, lateral leads 12 Lead; [place]ar Confirmed by Milton Ferguson [phone]) on 09/30/2020 3:10:07 PM   Radiology DG Chest Port 1 View  Result Date: 09/30/2020 CLINICAL DATA:  Chest pain and shortness of breath EXAM: PORTABLE CHEST 1 VIEW  COMPARISON:  None. FINDINGS: Lungs are clear. Heart is slightly enlarged with pulmonary vascularity normal. No adenopathy. No pneumothorax. No bone lesions. IMPRESSION: Cardiac prominence.  No edema or airspace opacity. Electronically Signed   By: Lowella Grip III M.D.   On: 09/30/2020 14:24    Procedures Procedures   Medications Ordered in ED Medications  nitroGLYCERIN (NITROSTAT) SL tablet 0.4 mg (0.4 mg Sublingual Given 09/30/20 1352)  nitroGLYCERIN 50 mg in dextrose 5 % 250 mL (0.2 mg/mL) infusion (10 mcg/min Intravenous Rate/Dose Change 09/30/20 1535)  0.9 %  sodium chloride infusion (has no administration in time range)  aspirin chewable  tablet 324 mg (has no administration in time range)  heparin injection 4,000 Units (has no administration in time range)  morphine 4 MG/ML injection 4 mg (4 mg Intravenous Given 09/30/20 1529)    ED Course  I have reviewed the triage vital signs and the nursing notes.  Pertinent labs & imaging results that were available during my care of the patient were reviewed by me and considered in my medical decision making (see chart for details). CRITICAL CARE Performed by: Milton Ferguson Total critical care time:35 minutes Critical care time was exclusive of separately billable procedures and treating other patients. Critical care was necessary to treat or prevent imminent or life-threatening deterioration. Critical care was time spent personally by me on the following activities: development of treatment plan with patient and/or surrogate as well as nursing, discussions with consultants, evaluation of patient's response to treatment, examination of patient, obtaining history from patient or surrogate, ordering and performing treatments and interventions, ordering and review of laboratory studies, ordering and review of radiographic studies, pulse oximetry and re-evaluation of patient's condition.   Patient's first EKG was not diagnostic for STEMI.  But his  symptoms continued and when I saw him he stated his pain was still a 10.  He got nitro without relief of the pain.  A repeat EKG suggested inferior injury.  I spoke with Dr. Burt Knack cardiology and it was decided to make the patient a STEMI he also had an elevated troponin MDM Rules/Calculators/A&P                          Patient with chest pain and EKG changes suggesting STEMI.  He will be transferred immediately to the Cath Lab Final Clinical Impression(s) / ED Diagnoses Final diagnoses:  ST elevation myocardial infarction (STEMI), unspecified artery [facility])    Rx / DC Orders ED Discharge Orders    None       Milton Ferguson, MD 09/30/20 1542

## 2020-09-30 NOTE — ED Notes (Signed)
EMS bedside to transport pt to [facility]. This RN was told by Network engineer that [facility] was unavailable. Manuela Schwartz, Charge RN to ride with pt.

## 2020-10-01 ENCOUNTER — Inpatient Hospital Stay (HOSPITAL_COMMUNITY): Payer: 59

## 2020-10-01 DIAGNOSIS — I214 Non-ST elevation (NSTEMI) myocardial infarction: Secondary | ICD-10-CM

## 2020-10-01 DIAGNOSIS — I2121 ST elevation (STEMI) myocardial infarction involving left circumflex coronary artery: Secondary | ICD-10-CM | POA: Diagnosis not present

## 2020-10-01 DIAGNOSIS — I2510 Atherosclerotic heart disease of native coronary artery without angina pectoris: Secondary | ICD-10-CM

## 2020-10-01 LAB — LIPID PANEL
Cholesterol: 171 mg/dL (ref 0–200)
Cholesterol: 151 mg/dL (ref 0–200)
HDL: 39 mg/dL — ABNORMAL LOW (ref >40)
HDL: 37 mg/dL — ABNORMAL LOW (ref >40)
LDL Cholesterol: 116 mg/dL — ABNORMAL HIGH (ref 0–99)
LDL Cholesterol: 102 mg/dL — ABNORMAL HIGH (ref 0–99)
Total CHOL/HDL Ratio: 4.4 RATIO
Total CHOL/HDL Ratio: 4.1 RATIO
Triglycerides: 80 mg/dL (ref <150)
Triglycerides: 60 mg/dL (ref <150)
VLDL: 16 mg/dL (ref 0–40)
VLDL: 12 mg/dL (ref 0–40)

## 2020-10-01 LAB — GLUCOSE, CAPILLARY
Glucose-Capillary: 118 mg/dL — ABNORMAL HIGH (ref 70–99)
Glucose-Capillary: 119 mg/dL — ABNORMAL HIGH (ref 70–99)
Glucose-Capillary: 124 mg/dL — ABNORMAL HIGH (ref 70–99)
Glucose-Capillary: 124 mg/dL — ABNORMAL HIGH (ref 70–99)
Glucose-Capillary: 125 mg/dL — ABNORMAL HIGH (ref 70–99)
Glucose-Capillary: 169 mg/dL — ABNORMAL HIGH (ref 70–99)

## 2020-10-01 LAB — BASIC METABOLIC PANEL
Anion gap: 9 (ref 5–15)
BUN: 18 mg/dL (ref 6–20)
CO2: 21 mmol/L — ABNORMAL LOW (ref 22–32)
Calcium: 8.3 mg/dL — ABNORMAL LOW (ref 8.9–10.3)
Chloride: 104 mmol/L (ref 98–111)
Creatinine, Ser: 1.01 mg/dL (ref 0.61–1.24)
GFR, Estimated: >60 mL/min (ref >60)
Glucose, Bld: 124 mg/dL — ABNORMAL HIGH (ref 70–99)
Potassium: 4.2 mmol/L (ref 3.5–5.1)
Sodium: 134 mmol/L — ABNORMAL LOW (ref 135–145)

## 2020-10-01 LAB — CBC
HCT: 41 % (ref 39.0–52.0)
Hemoglobin: 13.7 g/dL (ref 13.0–17.0)
MCH: 31.1 pg (ref 26.0–34.0)
MCHC: 33.4 g/dL (ref 30.0–36.0)
MCV: 93 fL (ref 80.0–100.0)
Platelets: 258 10*3/uL (ref 150–400)
RBC: 4.41 MIL/uL (ref 4.22–5.81)
RDW: 13.1 % (ref 11.5–15.5)
WBC: 15.9 10*3/uL — ABNORMAL HIGH (ref 4.0–10.5)
nRBC: 0 % (ref 0.0–0.2)

## 2020-10-01 LAB — ECHOCARDIOGRAM COMPLETE
Area-P 1/2: 2.51 cm2
Height: 68 in
S' Lateral: 3.5 cm
Weight: 4000 oz

## 2020-10-01 LAB — TROPONIN I (HIGH SENSITIVITY): Troponin I (High Sensitivity): >24000 ng/L (ref <18)

## 2020-10-01 LAB — HEPARIN LEVEL (UNFRACTIONATED)
Heparin Unfractionated: 0.2 IU/mL — ABNORMAL LOW (ref 0.30–0.70)
Heparin Unfractionated: 0.13 IU/mL — ABNORMAL LOW (ref 0.30–0.70)

## 2020-10-01 MED ORDER — MELATONIN 3 MG PO TABS
3.0000 mg | ORAL_TABLET | Freq: Every day | ORAL | Status: DC
  Administered 2020-10-01 – 2020-10-02 (×2): 3 mg via ORAL
  Filled 2020-10-01 (×2): qty 1

## 2020-10-01 MED ORDER — ORAL CARE MOUTH RINSE
15.0000 mL | Freq: Two times a day (BID) | OROMUCOSAL | Status: DC
  Administered 2020-10-01 – 2020-10-02 (×2): 15 mL via OROMUCOSAL

## 2020-10-01 MED ORDER — CHLORHEXIDINE GLUCONATE CLOTH 2 % EX PADS
6.0000 | MEDICATED_PAD | Freq: Every day | CUTANEOUS | Status: DC
  Administered 2020-10-01 – 2020-10-03 (×2): 6 via TOPICAL

## 2020-10-01 NOTE — Progress Notes (Signed)
Progress Note  Patient Name: Keith Franklin Date of Encounter: 10/01/2020  [facility] Cardiologist: Peter Martinique, MD   Subjective   He does not have angina or dyspnea or any complaints at the right radial cath access site  Inpatient Medications    Scheduled Meds: . aspirin EC  81 mg Oral Daily  . atorvastatin  80 mg Oral Daily  . Chlorhexidine Gluconate Cloth  6 each Topical Daily  . insulin aspart  0-20 Units Subcutaneous TID WC  . melatonin  3 mg Oral QHS  . metoprolol tartrate  25 mg Oral BID  . pantoprazole  40 mg Oral Daily  . sodium chloride flush  3 mL Intravenous Q12H   Continuous Infusions: . sodium chloride    . sodium chloride    . heparin 1,300 Units/hr (10/01/20 0944)  . nitroGLYCERIN 45 mcg/min (10/01/20 0946)  . tirofiban 0.15 mcg/kg/min (10/01/20 0823)   PRN Meds: sodium chloride, acetaminophen, morphine injection, nitroGLYCERIN, ondansetron (ZOFRAN) IV, sodium chloride flush   Vital Signs    Vitals:   10/01/20 0630 10/01/20 0700 10/01/20 0800 10/01/20 1000  BP: 105/70 124/74 115/72 100/64  Pulse: 67 82 80 77  Resp: (!) 22 20 (!) 21 19  Temp:  99.7 F (37.6 C)    TempSrc:      SpO2: 98% 97% 95% 96%  Weight:      Height:        Intake/Output Summary (Last 24 hours) at 10/01/2020 1053 Last data filed at 10/01/2020 0800 Gross per 24 hour  Intake 621.11 ml  Output 950 ml  Net -328.89 ml   Last 3 Weights 09/30/2020 10/25/2015  Weight (lbs) 250 lb 235 lb  Weight (kg) 113.399 kg 106.595 kg      Telemetry    Sinus rhythm with frequent PVCs and occasional bursts of nonsustained VT.  Most of these are 3/7 beats, but has had occasional 18 beat runs- Personally Reviewed  ECG    Normal sinus rhythm with left axis deviation and no ischemic repolarization abnormalities- Personally Reviewed  Physical Exam  Obese GEN: No acute distress.   Neck: No JVD Cardiac: RRR, no murmurs, rubs, or gallops.  Respiratory: Clear to auscultation  bilaterally. GI: Soft, nontender, non-distended  MS: No edema; No deformity. Neuro:  Nonfocal  Psych: Normal affect   Labs    High Sensitivity Troponin:   Recent Labs  Lab 09/30/20 1403 09/30/20 1525 09/30/20 1906 09/30/20 2247  TROPONINIHS 36* 65* 9,368* >24,000*      Chemistry Recent Labs  Lab 09/30/20 1403 09/30/20 1525 10/01/20 0656  NA 137  --  134*  K 3.6  --  4.2  CL 105  --  104  CO2 22  --  21*  GLUCOSE 173*  --  124*  BUN 18  --  18  CREATININE 1.10  --  1.01  CALCIUM 9.1  --  8.3*  PROT  --  7.0  --   ALBUMIN  --  4.1  --   AST  --  21  --   ALT  --  24  --   ALKPHOS  --  54  --   BILITOT  --  0.5  --   GFRNONAA >60  --  >60  ANIONGAP 10  --  9     Hematology Recent Labs  Lab 09/30/20 1403 10/01/20 0656  WBC 10.1 15.9*  RBC 5.04 4.41  HGB 15.8 13.7  HCT 46.2 41.0  MCV 91.7 93.0  MCH 31.3 31.1  MCHC 34.2 33.4  RDW 12.9 13.1  PLT 271 258    BNPNo results for input(s): BNP, PROBNP in the last 168 hours.   DDimer No results for input(s): DDIMER in the last 168 hours.   Radiology    CARDIAC CATHETERIZATION  Result Date: 09/30/2020  Prox LAD to Mid LAD lesion is 90% stenosed.  Mid LAD lesion is 90% stenosed.  1st Diag lesion is 40% stenosed.  Mid Cx to Dist Cx lesion is 100% stenosed.  RV Branch lesion is 90% stenosed.  2nd RPL lesion is 70% stenosed.  The left ventricular systolic function is normal.  LV end diastolic pressure is normal.  The left ventricular ejection fraction is 50-55% by visual estimate.  Post intervention, there is a 40% residual stenosis.  Balloon angioplasty was performed using a BALLOON SAPPHIRE 2.5X12.  1. Severe 2 vessel obstructive CAD.    There is severe diffuse segmental disease in the proximal to mid LAD 90%    100% occlusion of the LCx distally with thrombus 2. EF 50-55% with mid inferior HK 3. Normal LV EDP 4. Successful PCI of the distal LCx with POBA. Plan: will resume IV heparin in 8 hours. Continue IV  Aggrastat. Initiate beta blocker and high dose statin. Continue IV Ntg. Will consult CT surgery for CABG this admission given severe LAD disease. The LAD and OM 2 and 3 appear to be acceptable targets.   DG Chest Port 1 View  Result Date: 09/30/2020 CLINICAL DATA:  Chest pain and shortness of breath EXAM: PORTABLE CHEST 1 VIEW COMPARISON:  None. FINDINGS: Lungs are clear. Heart is slightly enlarged with pulmonary vascularity normal. No adenopathy. No pneumothorax. No bone lesions. IMPRESSION: Cardiac prominence.  No edema or airspace opacity. Electronically Signed   By: Lowella Grip III M.D.   On: 09/30/2020 14:24    Cardiac Studies   Cardiac catheterization 09/30/2020    Prox LAD to Mid LAD lesion is 90% stenosed.  Mid LAD lesion is 90% stenosed.  1st Diag lesion is 40% stenosed.  Mid Cx to Dist Cx lesion is 100% stenosed.  RV Branch lesion is 90% stenosed.  2nd RPL lesion is 70% stenosed.  The left ventricular systolic function is normal.  LV end diastolic pressure is normal.  The left ventricular ejection fraction is 50-55% by visual estimate.  Post intervention, there is a 40% residual stenosis.  Balloon angioplasty was performed using a BALLOON SAPPHIRE 2.5X12.   1. Severe 2 vessel obstructive CAD.    There is severe diffuse segmental disease in the proximal to mid LAD 90%    100% occlusion of the LCx distally with thrombus 2. EF 50-55% with mid inferior HK 3. Normal LV EDP 4. Successful PCI of the distal LCx with POBA.  Plan: will resume IV heparin in 8 hours. Continue IV Aggrastat. Initiate beta blocker and high dose statin. Continue IV Ntg. Will consult CT surgery for CABG this admission given severe LAD disease. The LAD and OM 2 and 3 appear to be acceptable targets.    ECHO 10/01/2020  1. Left ventricular ejection fraction, by estimation, is 45 to 50%. The  left ventricle has mildly decreased function. The left ventricle has no  regional wall motion  abnormalities. There is mild concentric left  ventricular hypertrophy. Left ventricular  diastolic parameters are consistent with Grade I diastolic dysfunction  (impaired relaxation). There is moderate hypokinesis of the left  ventricular, mid-apical anterior wall. There is moderate hypokinesis of  the left ventricular, entire inferolateral  wall.  2. Right ventricular systolic function is normal. The right ventricular  size is normal.  3. Left atrial size was mildly dilated.  4. The mitral valve is normal in structure. No evidence of mitral valve  regurgitation. No evidence of mitral stenosis.  5. The aortic valve is tricuspid. Aortic valve regurgitation is not  visualized. No aortic stenosis is present.  6. There is mild dilatation of the ascending aorta, measuring 42 mm.  7. The inferior vena cava is normal in size with greater than 50%  respiratory variability, suggesting right atrial pressure of 3 mmHg.   Patient Profile     60 y.o. male previously healthy other than obesity and GERD, presents with ECG suggestive of acute inferior STEMI and found to have severe obstructive two-vessel disease, with plain balloon angioplasty of the culprit distal left circumflex lesion, plan for subsequent bypass surgery.  Assessment & Plan    Currently asymptomatic on Aggrastat, heparin, intravenous nitroglycerin. The burden of nonsustained VT appears to be gradually decreasing through the morning.  On beta-blocker. High-dose statin. Echocardiogram shows only mildly depressed left ventricular systolic function.  He does not have any clinical heart failure and the echo does not suggest elevated filling pressures. Plan for bypass surgery early next week (versus emergent bypass if he deteriorates over the weekend).  For questions or updates, please contact [place] Please consult [email or web address] for contact info under        Signed, Sanda Klein, MD  10/01/2020, 10:53 AM

## 2020-10-01 NOTE — H&P (View-Only) (Signed)
[address]       [place] [address]             [phone]        Keith Franklin Medical Record #[id number] Date of Birth: [birth year]  Referring: No ref. provider found Primary Care: Patient, No Pcp Per (Inactive) Primary Cardiologist:Peter Martinique, MD  Chief Complaint:    Chief Complaint  Patient presents with  . Chest Pain  . Shortness of Breath    History of Present Illness:     60 yo male presents to the ED at [facility] with chest pain.  Code STEMI was called, and he was taken to the cath lab were he was found to have 2V CAD, and a CTO circ.  This was angioplastied, and he was then transferred to the ICU.  CTS was consults to assiste with management.   Past Medical and Surgical History: Previous Chest Surgery: no Previous Chest Radiation: no Diabetes Mellitus: no.  HbA1C pending Creatinine: 1.01  Past Medical History:  Diagnosis Date  . Cancer ([place]) 2010   skin cancer, Basal Cell Carcinoma   . COVID-19 07/2019  . GERD (gastroesophageal reflux disease)     Past Surgical History:  Procedure Laterality Date  . skin cancer removal      Social History: Support: lives with wife.  Daughter lives in [place] Use  Smoking Status Never Smoker  Smokeless Tobacco Never Used    Social History   Substance and Sexual Activity  Alcohol Use Yes     No Known Allergies    Current Facility-Administered Medications  Medication Dose Route Frequency Provider Last Rate Last Admin  . 0.9 %  sodium chloride infusion   Intravenous Continuous Martinique, Peter M, MD      . 0.9 %  sodium chloride infusion  250 mL Intravenous PRN Martinique, Peter M, MD      . acetaminophen (TYLENOL) tablet 650 mg  650 mg Oral Q4H PRN Martinique, Peter M, MD      . aspirin EC tablet 81 mg  81 mg Oral Daily Martinique, Peter M, MD   81 mg at 10/01/20 0941  . atorvastatin (LIPITOR) tablet 80 mg  80 mg Oral Daily Martinique, Peter M, MD   80 mg at  10/01/20 0941  . Chlorhexidine Gluconate Cloth 2 % PADS 6 each  6 each Topical Daily Martinique, Peter M, MD   6 each at 10/01/20 0944  . heparin ADULT infusion 100 units/mL (25000 units/240mL)  1,300 Units/hr Intravenous Continuous Richardine Service, RPH 13 mL/hr at 10/01/20 0944 1,300 Units/hr at 10/01/20 0944  . insulin aspart (novoLOG) injection 0-20 Units  0-20 Units Subcutaneous TID WC Martinique, Peter M, MD      . melatonin tablet 3 mg  3 mg Oral QHS Arps, Donald Siva, MD      . metoprolol tartrate (LOPRESSOR) tablet 25 mg  25 mg Oral BID Martinique, Peter M, MD   25 mg at 10/01/20 0941  . morphine 2 MG/ML injection 2 mg  2 mg Intravenous Q2H PRN Martinique, Peter M, MD   2 mg at 10/01/20 0546  . nitroGLYCERIN (NITROSTAT) SL tablet 0.4 mg  0.4 mg Sublingual Q5 Min x 3 PRN Martinique, Peter M, MD      . nitroGLYCERIN 50 mg in dextrose 5 % 250 mL (0.2 mg/mL) infusion  0-200 mcg/min Intravenous Continuous Martinique, Peter M, MD 13.5 mL/hr at 10/01/20 0946 45  mcg/min at 10/01/20 0946  . ondansetron (ZOFRAN) injection 4 mg  4 mg Intravenous Q6H PRN Martinique, Peter M, MD      . pantoprazole (PROTONIX) EC tablet 40 mg  40 mg Oral Daily Martinique, Peter M, MD   40 mg at 10/01/20 0941  . sodium chloride flush (NS) 0.9 % injection 3 mL  3 mL Intravenous Q12H Martinique, Peter M, MD   3 mL at 10/01/20 0944  . sodium chloride flush (NS) 0.9 % injection 3 mL  3 mL Intravenous PRN Martinique, Peter M, MD      . tirofiban (AGGRASTAT) infusion 50 mcg/mL 100 mL  0.15 mcg/kg/min Intravenous Continuous Heloise Purpura, RPH 20.4 mL/hr at 10/01/20 0823 0.15 mcg/kg/min at 10/01/20 5366    Medications Prior to Admission  Medication Sig Dispense Refill Last Dose  . aspirin EC 81 MG tablet Take 324 mg by mouth daily as needed for mild pain. Swallow whole.   09/30/2020 at Unknown time  . famotidine (PEPCID) 10 MG tablet Take 10 mg by mouth daily.   09/29/2020 at Unknown time  . naproxen (NAPROSYN) 500 MG tablet Take 1 tablet (500 mg total) by mouth 2 (two)  times daily. (Patient taking differently: Take 500 mg by mouth daily.) 30 tablet 0 09/29/2020 at Unknown time  . Simethicone (GAS-X PO) Take 2 tablets by mouth daily as needed (bloating).   09/30/2020 at Unknown time  . HYDROcodone-acetaminophen (NORCO/VICODIN) 5-325 MG tablet Take 1 tablet by mouth every 6 (six) hours as needed for severe pain. (Patient not taking: No sig reported) 12 tablet 0 Not Taking at Unknown time  . ondansetron (ZOFRAN ODT) 4 MG disintegrating tablet Take 1 tablet (4 mg total) by mouth every 8 (eight) hours as needed for nausea or vomiting. (Patient not taking: No sig reported) 20 tablet 0 Not Taking at Unknown time    Family History  Problem Relation Age of Onset  . Cancer Mother   . Heart attack Father      Review of Systems:   Review of Systems  Constitutional: Negative.   Respiratory: Positive for shortness of breath.   Cardiovascular: Positive for chest pain and palpitations.      Physical Exam: BP 100/64   Pulse 77   Temp 99.7 F (37.6 C)   Resp 19   Ht 5\' 8"  (1.727 m)   Wt 113.4 kg   SpO2 96%   BMI 38.01 kg/m  Physical Exam Constitutional:      General: He is not in acute distress.    Appearance: He is well-developed. He is obese. He is not ill-appearing.  Cardiovascular:     Rate and Rhythm: Normal rate and regular rhythm.  Pulmonary:     Effort: Pulmonary effort is normal.  Musculoskeletal:        General: Normal range of motion.     Cervical back: Normal range of motion.  Neurological:     General: No focal deficit present.     Mental Status: He is alert and oriented to person, place, and time.       Diagnostic Studies & Laboratory data:    Left Heart Catherization: Good distal targets in the LAD and Circ districution.  PL branch likely too small for bypass Echo: Preserved RV and LV function.  No significant valvular disease   I have independently reviewed the above radiologic studies and discussed with the patient   Recent  Lab Findings: Lab Results  Component Value Date   WBC 15.9 (  H) 10/01/2020   HGB 13.7 10/01/2020   HCT 41.0 10/01/2020   PLT 258 10/01/2020   GLUCOSE 124 (H) 10/01/2020   CHOL 151 10/01/2020   TRIG 60 10/01/2020   HDL 37 (L) 10/01/2020   LDLCALC 102 (H) 10/01/2020   ALT 24 09/30/2020   AST 21 09/30/2020   NA 134 (L) 10/01/2020   K 4.2 10/01/2020   CL 104 10/01/2020   CREATININE 1.01 10/01/2020   BUN 18 10/01/2020   CO2 21 (L) 10/01/2020   INR 1.0 09/30/2020      Assessment / Plan:   60 yo male presents with NSTEMI, s/p balloon angioplasty to the circ system.  Currently pain free.  He did have some VT overnight, likely reperfusion.  Will place R2 Pads Will plan for surgical revascularization on Monday, sooner if her develops symptoms     I  spent 40 minutes counseling the patient face to face.   Lajuana Matte 10/01/2020 10:32 AM

## 2020-10-01 NOTE — Progress Notes (Signed)
  Echocardiogram 2D Echocardiogram has been performed.  Oneal Deputy Ingvald Theisen 10/01/2020, 9:43 AM

## 2020-10-01 NOTE — Consult Note (Signed)
[address]       [place] [address]             [phone]        Audi Goodness [place] Medical Record #[id number] Date of Birth: 1960-10-02  Referring: No ref. provider found Primary Care: Patient, No Pcp Per (Inactive) Primary Cardiologist:Peter Martinique, MD  Chief Complaint:    Chief Complaint  Patient presents with  . Chest Pain  . Shortness of Breath    History of Present Illness:     60 yo male presents to the ED at [facility] with chest pain.  Code STEMI was called, and he was taken to the cath lab were he was found to have 2V CAD, and a CTO circ.  This was angioplastied, and he was then transferred to the ICU.  CTS was consults to assiste with management.   Past Medical and Surgical History: Previous Chest Surgery: no Previous Chest Radiation: no Diabetes Mellitus: no.  HbA1C pending Creatinine: 1.01  Past Medical History:  Diagnosis Date  . Cancer ([place]) 2010   skin cancer, Basal Cell Carcinoma   . COVID-19 07/2019  . GERD (gastroesophageal reflux disease)     Past Surgical History:  Procedure Laterality Date  . skin cancer removal      Social History: Support: lives with wife.  Daughter lives in [place] Use  Smoking Status Never Smoker  Smokeless Tobacco Never Used    Social History   Substance and Sexual Activity  Alcohol Use Yes     No Known Allergies    Current Facility-Administered Medications  Medication Dose Route Frequency Provider Last Rate Last Admin  . 0.9 %  sodium chloride infusion   Intravenous Continuous Martinique, Peter M, MD      . 0.9 %  sodium chloride infusion  250 mL Intravenous PRN Martinique, Peter M, MD      . acetaminophen (TYLENOL) tablet 650 mg  650 mg Oral Q4H PRN Martinique, Peter M, MD      . aspirin EC tablet 81 mg  81 mg Oral Daily Martinique, Peter M, MD   81 mg at 10/01/20 0941  . atorvastatin (LIPITOR) tablet 80 mg  80 mg Oral Daily Martinique, Peter M, MD   80 mg at  10/01/20 0941  . Chlorhexidine Gluconate Cloth 2 % PADS 6 each  6 each Topical Daily Martinique, Peter M, MD   6 each at 10/01/20 0944  . heparin ADULT infusion 100 units/mL (25000 units/281mL)  1,300 Units/hr Intravenous Continuous Richardine Service, RPH 13 mL/hr at 10/01/20 0944 1,300 Units/hr at 10/01/20 0944  . insulin aspart (novoLOG) injection 0-20 Units  0-20 Units Subcutaneous TID WC Martinique, Peter M, MD      . melatonin tablet 3 mg  3 mg Oral QHS Arps, Donald Siva, MD      . metoprolol tartrate (LOPRESSOR) tablet 25 mg  25 mg Oral BID Martinique, Peter M, MD   25 mg at 10/01/20 0941  . morphine 2 MG/ML injection 2 mg  2 mg Intravenous Q2H PRN Martinique, Peter M, MD   2 mg at 10/01/20 0546  . nitroGLYCERIN (NITROSTAT) SL tablet 0.4 mg  0.4 mg Sublingual Q5 Min x 3 PRN Martinique, Peter M, MD      . nitroGLYCERIN 50 mg in dextrose 5 % 250 mL (0.2 mg/mL) infusion  0-200 mcg/min Intravenous Continuous Martinique, Peter M, MD 13.5 mL/hr at 10/01/20 0946 45  mcg/min at 10/01/20 0946  . ondansetron (ZOFRAN) injection 4 mg  4 mg Intravenous Q6H PRN Martinique, Peter M, MD      . pantoprazole (PROTONIX) EC tablet 40 mg  40 mg Oral Daily Martinique, Peter M, MD   40 mg at 10/01/20 0941  . sodium chloride flush (NS) 0.9 % injection 3 mL  3 mL Intravenous Q12H Martinique, Peter M, MD   3 mL at 10/01/20 0944  . sodium chloride flush (NS) 0.9 % injection 3 mL  3 mL Intravenous PRN Martinique, Peter M, MD      . tirofiban (AGGRASTAT) infusion 50 mcg/mL 100 mL  0.15 mcg/kg/min Intravenous Continuous Heloise Purpura, RPH 20.4 mL/hr at 10/01/20 0823 0.15 mcg/kg/min at 10/01/20 8299    Medications Prior to Admission  Medication Sig Dispense Refill Last Dose  . aspirin EC 81 MG tablet Take 324 mg by mouth daily as needed for mild pain. Swallow whole.   09/30/2020 at Unknown time  . famotidine (PEPCID) 10 MG tablet Take 10 mg by mouth daily.   09/29/2020 at Unknown time  . naproxen (NAPROSYN) 500 MG tablet Take 1 tablet (500 mg total) by mouth 2 (two)  times daily. (Patient taking differently: Take 500 mg by mouth daily.) 30 tablet 0 09/29/2020 at Unknown time  . Simethicone (GAS-X PO) Take 2 tablets by mouth daily as needed (bloating).   09/30/2020 at Unknown time  . HYDROcodone-acetaminophen (NORCO/VICODIN) 5-325 MG tablet Take 1 tablet by mouth every 6 (six) hours as needed for severe pain. (Patient not taking: No sig reported) 12 tablet 0 Not Taking at Unknown time  . ondansetron (ZOFRAN ODT) 4 MG disintegrating tablet Take 1 tablet (4 mg total) by mouth every 8 (eight) hours as needed for nausea or vomiting. (Patient not taking: No sig reported) 20 tablet 0 Not Taking at Unknown time    Family History  Problem Relation Age of Onset  . Cancer Mother   . Heart attack Father      Review of Systems:   Review of Systems  Constitutional: Negative.   Respiratory: Positive for shortness of breath.   Cardiovascular: Positive for chest pain and palpitations.      Physical Exam: BP 100/64   Pulse 77   Temp 99.7 F (37.6 C)   Resp 19   Ht 5\' 8"  (1.727 m)   Wt 113.4 kg   SpO2 96%   BMI 38.01 kg/m  Physical Exam Constitutional:      General: He is not in acute distress.    Appearance: He is well-developed. He is obese. He is not ill-appearing.  Cardiovascular:     Rate and Rhythm: Normal rate and regular rhythm.  Pulmonary:     Effort: Pulmonary effort is normal.  Musculoskeletal:        General: Normal range of motion.     Cervical back: Normal range of motion.  Neurological:     General: No focal deficit present.     Mental Status: He is alert and oriented to person, place, and time.       Diagnostic Studies & Laboratory data:    Left Heart Catherization: Good distal targets in the LAD and Circ districution.  PL branch likely too small for bypass Echo: Preserved RV and LV function.  No significant valvular disease   I have independently reviewed the above radiologic studies and discussed with the patient   Recent  Lab Findings: Lab Results  Component Value Date   WBC 15.9 (  H) 10/01/2020   HGB 13.7 10/01/2020   HCT 41.0 10/01/2020   PLT 258 10/01/2020   GLUCOSE 124 (H) 10/01/2020   CHOL 151 10/01/2020   TRIG 60 10/01/2020   HDL 37 (L) 10/01/2020   LDLCALC 102 (H) 10/01/2020   ALT 24 09/30/2020   AST 21 09/30/2020   NA 134 (L) 10/01/2020   K 4.2 10/01/2020   CL 104 10/01/2020   CREATININE 1.01 10/01/2020   BUN 18 10/01/2020   CO2 21 (L) 10/01/2020   INR 1.0 09/30/2020      Assessment / Plan:   60 yo male presents with NSTEMI, s/p balloon angioplasty to the circ system.  Currently pain free.  He did have some VT overnight, likely reperfusion.  Will place R2 Pads Will plan for surgical revascularization on Monday, sooner if her develops symptoms     I  spent 40 minutes counseling the patient face to face.   Lajuana Matte 10/01/2020 10:32 AM

## 2020-10-01 NOTE — Progress Notes (Signed)
[place] for Heparin & Tirofiban Indication: chest pain/ACS  No Known Allergies  Patient Measurements: Height: 5\' 8"  (172.7 cm) Weight: 113.4 kg (250 lb) IBW/kg (Calculated) : 68.4 Heparin Dosing Weight: 93.9 kg  Vital Signs: Temp: 99.7 F (37.6 C) (06/04 0700) Temp Source: Oral (06/04 0341) BP: 115/72 (06/04 0800) Pulse Rate: 80 (06/04 0800)  Labs: Recent Labs    09/30/20 1403 09/30/20 1525 09/30/20 1906 09/30/20 2247 10/01/20 0656  HGB 15.8  --   --   --  13.7  HCT 46.2  --   --   --  41.0  PLT 271  --   --   --  258  APTT  --   --  81*  --   --   LABPROT  --   --  13.5  --   --   INR  --   --  1.0  --   --   HEPARINUNFRC  --   --   --   --  0.20*  CREATININE 1.10  --   --   --  1.01  TROPONINIHS 36* 65* 9,368* >24,000*  --     Estimated Creatinine Clearance: 95 mL/min (by C-G formula based on SCr of 1.01 mg/dL).   Medical History: Past Medical History:  Diagnosis Date  . Cancer ([place]) 2010   skin cancer, Basal Cell Carcinoma   . COVID-19 07/2019  . GERD (gastroesophageal reflux disease)     Medications:  Medications Prior to Admission  Medication Sig Dispense Refill Last Dose  . aspirin EC 81 MG tablet Take 324 mg by mouth daily as needed for mild pain. Swallow whole.   09/30/2020 at Unknown time  . famotidine (PEPCID) 10 MG tablet Take 10 mg by mouth daily.   09/29/2020 at Unknown time  . naproxen (NAPROSYN) 500 MG tablet Take 1 tablet (500 mg total) by mouth 2 (two) times daily. (Patient taking differently: Take 500 mg by mouth daily.) 30 tablet 0 09/29/2020 at Unknown time  . Simethicone (GAS-X PO) Take 2 tablets by mouth daily as needed (bloating).   09/30/2020 at Unknown time  . HYDROcodone-acetaminophen (NORCO/VICODIN) 5-325 MG tablet Take 1 tablet by mouth every 6 (six) hours as needed for severe pain. (Patient not taking: No sig reported) 12 tablet 0 Not Taking at Unknown time  . ondansetron (ZOFRAN ODT) 4 MG  disintegrating tablet Take 1 tablet (4 mg total) by mouth every 8 (eight) hours as needed for nausea or vomiting. (Patient not taking: No sig reported) 20 tablet 0 Not Taking at Unknown time   Infusions:  . sodium chloride    . sodium chloride    . heparin 1,200 Units/hr (10/01/20 0800)  . nitroGLYCERIN 45 mcg/min (10/01/20 0800)  . tirofiban 0.15 mcg/kg/min (10/01/20 3875)    Assessment: 59 yom that presented to [facility] ED with acute onset chest pain. Found to have ST elevations and Code Stemi activated and transported to [facility] cath lab. 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 Awaiting TCTS eval for potential CABG. Pharmacy asked to start IV heparin and tirofiban for 48 hrs.  Initial heparin level (0.2) subtherapeutic on drip rate 1200 units/hr. Remains on tirofiban. CBC wnl, pltc 258. No overt bleeding or infusion issues per discussion with nursing and patient.  Goal of Therapy:  Heparin level 0.3-0.5 units/ml while on tirofiban Monitor platelets by anticoagulation protocol: Yes  Plan:  Increase heparin infusion to 1300 units/hr Check 6hr HL Continue tirofiban until CT surgery evaluates for CABG Monitor daily HL, CBC, s/sx bleeding  Richardine Service, PharmD, BCPS PGY2 Cardiology Pharmacy Resident Phone: [phone] 10/01/2020  9:39 AM  Please check [email or web address] for unit-specific pharmacy phone numbers.

## 2020-10-01 NOTE — Progress Notes (Signed)
[place] for Heparin & Tirofiban Indication: chest pain/ACS  No Known Allergies  Patient Measurements: Height: 5\' 8"  (172.7 cm) Weight: 113.4 kg (250 lb) IBW/kg (Calculated) : 68.4 Heparin Dosing Weight: 93.9 kg  Vital Signs: Temp: 99.5 F (37.5 C) (06/04 1500) BP: 108/70 (06/04 1600) Pulse Rate: 70 (06/04 1600)  Labs: Recent Labs    09/30/20 1403 09/30/20 1525 09/30/20 1906 09/30/20 2247 10/01/20 0656 10/01/20 1523  HGB 15.8  --   --   --  13.7  --   HCT 46.2  --   --   --  41.0  --   PLT 271  --   --   --  258  --   APTT  --   --  81*  --   --   --   LABPROT  --   --  13.5  --   --   --   INR  --   --  1.0  --   --   --   HEPARINUNFRC  --   --   --   --  0.20* 0.13*  CREATININE 1.10  --   --   --  1.01  --   TROPONINIHS 36* 65* 9,368* >24,000*  --   --     Estimated Creatinine Clearance: 95 mL/min (by C-G formula based on SCr of 1.01 mg/dL).   Medical History: Past Medical History:  Diagnosis Date  . Cancer ([place]) 2010   skin cancer, Basal Cell Carcinoma   . COVID-19 07/2019  . GERD (gastroesophageal reflux disease)     Medications:  Medications Prior to Admission  Medication Sig Dispense Refill Last Dose  . aspirin EC 81 MG tablet Take 324 mg by mouth daily as needed for mild pain. Swallow whole.   09/30/2020 at Unknown time  . famotidine (PEPCID) 10 MG tablet Take 10 mg by mouth daily.   09/29/2020 at Unknown time  . naproxen (NAPROSYN) 500 MG tablet Take 1 tablet (500 mg total) by mouth 2 (two) times daily. (Patient taking differently: Take 500 mg by mouth daily.) 30 tablet 0 09/29/2020 at Unknown time  . Simethicone (GAS-X PO) Take 2 tablets by mouth daily as needed (bloating).   09/30/2020 at Unknown time  . HYDROcodone-acetaminophen (NORCO/VICODIN) 5-325 MG tablet Take 1 tablet by mouth every 6 (six) hours as needed for severe pain. (Patient not taking: No sig reported) 12 tablet 0 Not Taking at Unknown time  .  ondansetron (ZOFRAN ODT) 4 MG disintegrating tablet Take 1 tablet (4 mg total) by mouth every 8 (eight) hours as needed for nausea or vomiting. (Patient not taking: No sig reported) 20 tablet 0 Not Taking at Unknown time   Infusions:  . sodium chloride    . sodium chloride    . heparin 1,300 Units/hr (10/01/20 1600)  . nitroGLYCERIN 45 mcg/min (10/01/20 1600)  . tirofiban 0.15 mcg/kg/min (10/01/20 1600)    Assessment: 59 yom that presented to [facility] ED with acute onset chest pain. Found to have ST elevations and Code Stemi activated and transported to [facility] cath lab. 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 Awaiting TCTS eval for potential CABG. Pharmacy asked to start IV heparin and tirofiban -heparin level 0.13 on heparin 1300 units/hr    Goal of Therapy:  Heparin level 0.3-0.5 units/ml while on tirofiban Monitor platelets by anticoagulation protocol: Yes   Plan:  Increase heparin infusion to 1500 units/hr Check 6hr HL  Continue tirofiban until CT surgery evaluates for CABG Monitor daily HL, CBC, s/sx bleeding  Hildred Laser, PharmD Clinical Pharmacist **Pharmacist phone directory can now be found on [email or web address] ([email or web address]).  Listed under [place].

## 2020-10-02 ENCOUNTER — Inpatient Hospital Stay (HOSPITAL_COMMUNITY): Payer: 59

## 2020-10-02 LAB — POCT I-STAT 7, (LYTES, BLD GAS, ICA,H+H)
Acid-Base Excess: 1 mmol/L (ref 0.0–2.0)
Bicarbonate: 24.7 mmol/L (ref 20.0–28.0)
Calcium, Ion: 1.25 mmol/L (ref 1.15–1.40)
HCT: 39 % (ref 39.0–52.0)
Hemoglobin: 13.3 g/dL (ref 13.0–17.0)
O2 Saturation: 95 %
Patient temperature: 99.9
Potassium: 3.9 mmol/L (ref 3.5–5.1)
Sodium: 138 mmol/L (ref 135–145)
TCO2: 26 mmol/L (ref 22–32)
pCO2 arterial: 38 mmHg (ref 32.0–48.0)
pH, Arterial: 7.424 (ref 7.350–7.450)
pO2, Arterial: 79 mmHg — ABNORMAL LOW (ref 83.0–108.0)

## 2020-10-02 LAB — GLUCOSE, CAPILLARY
Glucose-Capillary: 101 mg/dL — ABNORMAL HIGH (ref 70–99)
Glucose-Capillary: 103 mg/dL — ABNORMAL HIGH (ref 70–99)
Glucose-Capillary: 100 mg/dL — ABNORMAL HIGH (ref 70–99)
Glucose-Capillary: 103 mg/dL — ABNORMAL HIGH (ref 70–99)

## 2020-10-02 LAB — CBC
HCT: 41.6 % (ref 39.0–52.0)
Hemoglobin: 14.1 g/dL (ref 13.0–17.0)
MCH: 31.7 pg (ref 26.0–34.0)
MCHC: 33.9 g/dL (ref 30.0–36.0)
MCV: 93.5 fL (ref 80.0–100.0)
Platelets: 262 10*3/uL (ref 150–400)
RBC: 4.45 MIL/uL (ref 4.22–5.81)
RDW: 13.1 % (ref 11.5–15.5)
WBC: 13.7 10*3/uL — ABNORMAL HIGH (ref 4.0–10.5)
nRBC: 0 % (ref 0.0–0.2)

## 2020-10-02 LAB — URINALYSIS, ROUTINE W REFLEX MICROSCOPIC
Bacteria, UA: NONE SEEN
Bilirubin Urine: NEGATIVE
Glucose, UA: NEGATIVE mg/dL
Ketones, ur: NEGATIVE mg/dL
Leukocytes,Ua: NEGATIVE
Nitrite: NEGATIVE
Protein, ur: NEGATIVE mg/dL
Specific Gravity, Urine: 1.01 (ref 1.005–1.030)
pH: 6 (ref 5.0–8.0)

## 2020-10-02 LAB — HEPARIN LEVEL (UNFRACTIONATED)
Heparin Unfractionated: 0.33 IU/mL (ref 0.30–0.70)
Heparin Unfractionated: 0.31 IU/mL (ref 0.30–0.70)

## 2020-10-02 LAB — ABO/RH: ABO/RH(D): A POS

## 2020-10-02 LAB — SURGICAL PCR SCREEN
MRSA, PCR: NEGATIVE
Staphylococcus aureus: NEGATIVE

## 2020-10-02 LAB — PREPARE RBC (CROSSMATCH)

## 2020-10-02 MED ORDER — MILRINONE LACTATE IN DEXTROSE 20-5 MG/100ML-% IV SOLN
0.3000 ug/kg/min | INTRAVENOUS | Status: AC
  Administered 2020-10-03: .3 ug/kg/min via INTRAVENOUS
  Filled 2020-10-02: qty 100

## 2020-10-02 MED ORDER — CEFAZOLIN SODIUM-DEXTROSE 2-4 GM/100ML-% IV SOLN
2.0000 g | INTRAVENOUS | Status: DC
  Administered 2020-10-03: 2 g via INTRAVENOUS
  Filled 2020-10-02: qty 100

## 2020-10-02 MED ORDER — AMLODIPINE BESYLATE 5 MG PO TABS
5.0000 mg | ORAL_TABLET | Freq: Every day | ORAL | Status: DC
  Administered 2020-10-02: 5 mg via ORAL
  Filled 2020-10-02: qty 1

## 2020-10-02 MED ORDER — SODIUM CHLORIDE 0.9% FLUSH
3.0000 mL | Freq: Two times a day (BID) | INTRAVENOUS | Status: DC
  Administered 2020-10-02 (×2): 3 mL via INTRAVENOUS

## 2020-10-02 MED ORDER — TEMAZEPAM 15 MG PO CAPS
15.0000 mg | ORAL_CAPSULE | Freq: Once | ORAL | Status: AC | PRN
  Administered 2020-10-02: 15 mg via ORAL
  Filled 2020-10-02: qty 1

## 2020-10-02 MED ORDER — DEXMEDETOMIDINE HCL IN NACL 400 MCG/100ML IV SOLN
0.1000 ug/kg/h | INTRAVENOUS | Status: AC
  Administered 2020-10-03: .5 ug/kg/h via INTRAVENOUS
  Filled 2020-10-02: qty 100

## 2020-10-02 MED ORDER — CHLORHEXIDINE GLUCONATE 0.12 % MT SOLN
15.0000 mL | Freq: Once | OROMUCOSAL | Status: AC
Start: 2020-10-03 — End: 2020-10-03
  Administered 2020-10-03: 15 mL via OROMUCOSAL

## 2020-10-02 MED ORDER — EPINEPHRINE HCL 5 MG/250ML IV SOLN IN NS
0.0000 ug/min | INTRAVENOUS | Status: AC
  Administered 2020-10-03: 3 ug/min via INTRAVENOUS
  Filled 2020-10-02: qty 250

## 2020-10-02 MED ORDER — POTASSIUM CHLORIDE 2 MEQ/ML IV SOLN
80.0000 meq | INTRAVENOUS | Status: DC
  Filled 2020-10-02: qty 40

## 2020-10-02 MED ORDER — METOPROLOL TARTRATE 25 MG PO TABS
25.0000 mg | ORAL_TABLET | Freq: Two times a day (BID) | ORAL | Status: DC
  Administered 2020-10-02: 25 mg via ORAL
  Filled 2020-10-02: qty 1

## 2020-10-02 MED ORDER — CHLORHEXIDINE GLUCONATE CLOTH 2 % EX PADS
6.0000 | MEDICATED_PAD | Freq: Once | CUTANEOUS | Status: AC
  Administered 2020-10-02: 6 via TOPICAL

## 2020-10-02 MED ORDER — MAGNESIUM SULFATE 50 % IJ SOLN
40.0000 meq | INTRAMUSCULAR | Status: DC
  Filled 2020-10-02: qty 9.85

## 2020-10-02 MED ORDER — VANCOMYCIN HCL 1500 MG/300ML IV SOLN
1500.0000 mg | INTRAVENOUS | Status: AC
  Administered 2020-10-03: 1500 mg via INTRAVENOUS
  Filled 2020-10-02: qty 300

## 2020-10-02 MED ORDER — TRANEXAMIC ACID (OHS) BOLUS VIA INFUSION
15.0000 mg/kg | INTRAVENOUS | Status: AC
  Administered 2020-10-03: 1317 mg via INTRAVENOUS
  Filled 2020-10-02: qty 1317

## 2020-10-02 MED ORDER — BISACODYL 5 MG PO TBEC
5.0000 mg | DELAYED_RELEASE_TABLET | Freq: Once | ORAL | Status: AC
  Administered 2020-10-02: 5 mg via ORAL
  Filled 2020-10-02: qty 1

## 2020-10-02 MED ORDER — METOPROLOL TARTRATE 12.5 MG HALF TABLET
12.5000 mg | ORAL_TABLET | Freq: Once | ORAL | Status: AC
Start: 2020-10-03 — End: 2020-10-03
  Administered 2020-10-03: 12.5 mg via ORAL
  Filled 2020-10-02: qty 1

## 2020-10-02 MED ORDER — METOPROLOL SUCCINATE ER 25 MG PO TB24: 25.0000 mg | ORAL_TABLET | Freq: Every day | ORAL | Status: DC

## 2020-10-02 MED ORDER — TRANEXAMIC ACID 1000 MG/10ML IV SOLN
1.5000 mg/kg/h | INTRAVENOUS | Status: AC
  Administered 2020-10-03: 1.5 mg/kg/h via INTRAVENOUS
  Filled 2020-10-02: qty 25

## 2020-10-02 MED ORDER — CEFAZOLIN SODIUM-DEXTROSE 2-4 GM/100ML-% IV SOLN
2.0000 g | INTRAVENOUS | Status: AC
  Administered 2020-10-03: 2 g via INTRAVENOUS
  Filled 2020-10-02: qty 100

## 2020-10-02 MED ORDER — NITROGLYCERIN IN D5W 200-5 MCG/ML-% IV SOLN
2.0000 ug/min | INTRAVENOUS | Status: DC
  Filled 2020-10-02: qty 250

## 2020-10-02 MED ORDER — NICOTINE 14 MG/24HR TD PT24: 14.0000 mg | MEDICATED_PATCH | Freq: Every day | TRANSDERMAL | Status: DC

## 2020-10-02 MED ORDER — MUPIROCIN 2 % EX OINT
1.0000 "application " | TOPICAL_OINTMENT | Freq: Two times a day (BID) | CUTANEOUS | Status: DC
  Administered 2020-10-02 (×3): 1 via NASAL
  Filled 2020-10-02: qty 22

## 2020-10-02 MED ORDER — AMLODIPINE BESYLATE 5 MG PO TABS: 5.0000 mg | ORAL_TABLET | Freq: Every day | ORAL | Status: DC

## 2020-10-02 MED ORDER — CARVEDILOL 6.25 MG PO TABS: 6.2500 mg | ORAL_TABLET | Freq: Two times a day (BID) | ORAL | Status: DC

## 2020-10-02 MED ORDER — TRANEXAMIC ACID (OHS) PUMP PRIME SOLUTION
2.0000 mg/kg | INTRAVENOUS | Status: DC
  Filled 2020-10-02 (×2): qty 2.34

## 2020-10-02 MED ORDER — NOREPINEPHRINE 4 MG/250ML-% IV SOLN
0.0000 ug/min | INTRAVENOUS | Status: DC
  Filled 2020-10-02: qty 250

## 2020-10-02 MED ORDER — INSULIN REGULAR(HUMAN) IN NACL 100-0.9 UT/100ML-% IV SOLN
INTRAVENOUS | Status: AC
  Administered 2020-10-03: 5.5 [IU]/h via INTRAVENOUS
  Filled 2020-10-02: qty 100

## 2020-10-02 MED ORDER — PHENYLEPHRINE HCL-NACL 20-0.9 MG/250ML-% IV SOLN
30.0000 ug/min | INTRAVENOUS | Status: AC
  Administered 2020-10-03: 25 ug/min via INTRAVENOUS
  Filled 2020-10-02: qty 250

## 2020-10-02 MED ORDER — SODIUM CHLORIDE 0.9 % IV SOLN
INTRAVENOUS | Status: DC
  Filled 2020-10-02: qty 30

## 2020-10-02 MED ORDER — PLASMA-LYTE A IV SOLN
INTRAVENOUS | Status: DC
  Filled 2020-10-02: qty 5

## 2020-10-02 NOTE — Progress Notes (Signed)
Progress Note  Patient Name: Keith Franklin Date of Encounter: 10/02/2020  [facility] Cardiologist: Keith Martinique, MD   Subjective   Denies angina dyspnea.  Headache from nitroglycerin.  Inpatient Medications    Scheduled Meds: . aspirin EC  81 mg Oral Daily  . atorvastatin  80 mg Oral Daily  . Chlorhexidine Gluconate Cloth  6 each Topical Daily  . insulin aspart  0-20 Units Subcutaneous TID WC  . mouth rinse  15 mL Mouth Rinse BID  . melatonin  3 mg Oral QHS  . metoprolol tartrate  25 mg Oral BID  . mupirocin ointment  1 application Nasal BID  . nicotine  14 mg Transdermal Daily  . pantoprazole  40 mg Oral Daily  . sodium chloride flush  3 mL Intravenous Q12H  . sodium chloride flush  3 mL Intravenous Q12H   Continuous Infusions: . sodium chloride    . sodium chloride    . heparin 1,500 Units/hr (10/02/20 0700)  . nitroGLYCERIN 25 mcg/min (10/02/20 0700)  . tirofiban 0.15 mcg/kg/min (10/02/20 0859)   PRN Meds: sodium chloride, acetaminophen, morphine injection, nitroGLYCERIN, ondansetron (ZOFRAN) IV, sodium chloride flush   Vital Signs    Vitals:   10/02/20 0600 10/02/20 0630 10/02/20 0700 10/02/20 0730  BP:  137/86 126/62   Pulse:  75 79 87  Resp:  (!) 25 20 (!) 27  Temp:   97.6 F (36.4 C)   TempSrc:      SpO2:  97% 97% 96%  Weight: 117 kg     Height:        Intake/Output Summary (Last 24 hours) at 10/02/2020 0952 Last data filed at 10/02/2020 0700 Gross per 24 hour  Intake 1254.18 ml  Output 3000 ml  Net -1745.82 ml   Last 3 Weights 10/02/2020 09/30/2020 10/25/2015  Weight (lbs) 257 lb 15 oz 250 lb 235 lb  Weight (kg) 117 kg 113.399 kg 106.595 kg      Telemetry    Sinus rhythm- Personally Reviewed  ECG    No new tracing- Personally Reviewed  Physical Exam  Obese.  Appears comfortable. GEN: No acute distress.   Neck: No JVD Cardiac: RRR, no murmurs, rubs, or gallops.  Respiratory: Clear to auscultation bilaterally. GI: Soft, nontender,  non-distended  MS: No edema; No deformity. Neuro:  Nonfocal  Psych: Normal affect   Labs    High Sensitivity Troponin:   Recent Labs  Lab 09/30/20 1403 09/30/20 1525 09/30/20 1906 09/30/20 2247  TROPONINIHS 36* 65* 9,368* >24,000*      Chemistry Recent Labs  Lab 09/30/20 1403 09/30/20 1525 10/01/20 0656  NA 137  --  134*  K 3.6  --  4.2  CL 105  --  104  CO2 22  --  21*  GLUCOSE 173*  --  124*  BUN 18  --  18  CREATININE 1.10  --  1.01  CALCIUM 9.1  --  8.3*  PROT  --  7.0  --   ALBUMIN  --  4.1  --   AST  --  21  --   ALT  --  24  --   ALKPHOS  --  54  --   BILITOT  --  0.5  --   GFRNONAA >60  --  >60  ANIONGAP 10  --  9     Hematology Recent Labs  Lab 09/30/20 1403 10/01/20 0656  WBC 10.1 15.9*  RBC 5.04 4.41  HGB 15.8 13.7  HCT 46.2  41.0  MCV 91.7 93.0  MCH 31.3 31.1  MCHC 34.2 33.4  RDW 12.9 13.1  PLT 271 258    BNPNo results for input(s): BNP, PROBNP in the last 168 hours.   DDimer No results for input(s): DDIMER in the last 168 hours.   Radiology    CARDIAC CATHETERIZATION  Result Date: 09/30/2020  Prox LAD to Mid LAD lesion is 90% stenosed.  Mid LAD lesion is 90% stenosed.  1st Diag lesion is 40% stenosed.  Mid Cx to Dist Cx lesion is 100% stenosed.  RV Branch lesion is 90% stenosed.  2nd RPL lesion is 70% stenosed.  The left ventricular systolic function is normal.  LV end diastolic pressure is normal.  The left ventricular ejection fraction is 50-55% by visual estimate.  Post intervention, there is a 40% residual stenosis.  Balloon angioplasty was performed using a BALLOON SAPPHIRE 2.5X12.  1. Severe 2 vessel obstructive CAD.    There is severe diffuse segmental disease in the proximal to mid LAD 90%    100% occlusion of the LCx distally with thrombus 2. EF 50-55% with mid inferior HK 3. Normal LV EDP 4. Successful PCI of the distal LCx with POBA. Plan: will resume IV heparin in 8 hours. Continue IV Aggrastat. Initiate beta blocker  and high dose statin. Continue IV Ntg. Will consult CT surgery for CABG this admission given severe LAD disease. The LAD and OM 2 and 3 appear to be acceptable targets.   DG Chest Port 1 View  Result Date: 09/30/2020 CLINICAL DATA:  Chest pain and shortness of breath EXAM: PORTABLE CHEST 1 VIEW COMPARISON:  None. FINDINGS: Lungs are clear. Heart is slightly enlarged with pulmonary vascularity normal. No adenopathy. No pneumothorax. No bone lesions. IMPRESSION: Cardiac prominence.  No edema or airspace opacity. Electronically Signed   By: Lowella Grip III M.D.   On: 09/30/2020 14:24   ECHOCARDIOGRAM COMPLETE  Result Date: 10/01/2020    ECHOCARDIOGRAM REPORT   Patient Name:   Keith Franklin [facility] Date of Exam: 10/01/2020 Medical Rec #:  [id number]       Height:       68.0 in Accession #:    [id number]      Weight:       250.0 lb Date of Birth:  [birth year]      BSA:          2.247 m Patient Age:    60 years        BP:           107/65 mmHg Patient Gender: M               HR:           79 bpm. Exam Location:  Inpatient Procedure: 2D Echo, Color Doppler and Cardiac Doppler Indications:    Acute MI i21.9  History:        Patient has no prior history of Echocardiogram examinations.                 CAD; Risk Factors:COVID+ 07/2019.  Sonographer:    Raquel Sarna Senior RDCS Referring Phys: [id number] Keith Franklin IMPRESSIONS  1. Left ventricular ejection fraction, by estimation, is 45 to 50%. The left ventricle has mildly decreased function. The left ventricle has no regional wall motion abnormalities. There is mild concentric left ventricular hypertrophy. Left ventricular diastolic parameters are consistent with Grade I diastolic dysfunction (impaired relaxation). There is moderate hypokinesis of the left ventricular, mid-apical anterior wall. There is  moderate hypokinesis of the left ventricular, entire inferolateral wall.  2. Right ventricular systolic function is normal. The right ventricular size is normal.  3. Left atrial  size was mildly dilated.  4. The mitral valve is normal in structure. No evidence of mitral valve regurgitation. No evidence of mitral stenosis.  5. The aortic valve is tricuspid. Aortic valve regurgitation is not visualized. No aortic stenosis is present.  6. There is mild dilatation of the ascending aorta, measuring 42 mm.  7. The inferior vena cava is normal in size with greater than 50% respiratory variability, suggesting right atrial pressure of 3 mmHg. FINDINGS  Left Ventricle: Left ventricular ejection fraction, by estimation, is 45 to 50%. The left ventricle has mildly decreased function. The left ventricle has no regional wall motion abnormalities. Moderate hypokinesis of the left ventricular, mid-apical anterior wall. Moderate hypokinesis of the left ventricular, entire inferolateral wall. The left ventricular internal cavity size was normal in size. There is mild concentric left ventricular hypertrophy. Left ventricular diastolic parameters are consistent with Grade I diastolic dysfunction (impaired relaxation). Normal left ventricular filling pressure. Right Ventricle: The right ventricular size is normal. No increase in right ventricular wall thickness. Right ventricular systolic function is normal. Left Atrium: Left atrial size was mildly dilated. Right Atrium: Right atrial size was normal in size. Pericardium: There is no evidence of pericardial effusion. Mitral Valve: The mitral valve is normal in structure. No evidence of mitral valve regurgitation. No evidence of mitral valve stenosis. Tricuspid Valve: The tricuspid valve is normal in structure. Tricuspid valve regurgitation is not demonstrated. No evidence of tricuspid stenosis. Aortic Valve: The aortic valve is tricuspid. Aortic valve regurgitation is not visualized. No aortic stenosis is present. Pulmonic Valve: The pulmonic valve was normal in structure. Pulmonic valve regurgitation is not visualized. No evidence of pulmonic stenosis. Aorta:  The aortic root is normal in size and structure. There is mild dilatation of the ascending aorta, measuring 42 mm. Venous: The inferior vena cava is normal in size with greater than 50% respiratory variability, suggesting right atrial pressure of 3 mmHg. IAS/Shunts: No atrial level shunt detected by color flow Doppler.  LEFT VENTRICLE PLAX 2D LVIDd:         4.90 cm  Diastology LVIDs:         3.50 cm  LV e' medial:    7.40 cm/s LV PW:         1.30 cm  LV E/e' medial:  9.1 LV IVS:        1.50 cm  LV e' lateral:   8.38 cm/s LVOT diam:     2.00 cm  LV E/e' lateral: 8.0 LV SV:         64 LV SV Index:   28 LVOT Area:     3.14 cm  RIGHT VENTRICLE RV S prime:     8.05 cm/s TAPSE (M-mode): 1.9 cm LEFT ATRIUM             Index       RIGHT ATRIUM           Index LA diam:        3.90 cm 1.74 cm/m  RA Area:     18.00 cm LA Vol (A2C):   47.4 ml 21.09 ml/m RA Volume:   47.90 ml  21.32 ml/m LA Vol (A4C):   60.7 ml 27.01 ml/m LA Biplane Vol: 56.1 ml 24.97 ml/m  AORTIC VALVE LVOT Vmax:   101.00 cm/s LVOT Vmean:  79.200  cm/s LVOT VTI:    0.203 m  AORTA Ao Root diam: 3.90 cm Ao Asc diam:  4.00 cm MITRAL VALVE MV Area (PHT): 2.51 cm    SHUNTS MV Decel Time: 302 msec    Systemic VTI:  0.20 m MV E velocity: 67.30 cm/s  Systemic Diam: 2.00 cm MV A velocity: 76.70 cm/s MV E/A ratio:  0.88 Mackinsey Pelland MD Electronically signed by Sanda Klein MD Signature Date/Time: 10/01/2020/10:57:51 AM    Final     Cardiac Studies    Cardiac catheterization 09/30/2020    Prox LAD to Mid LAD lesion is 90% stenosed.  Mid LAD lesion is 90% stenosed.  1st Diag lesion is 40% stenosed.  Mid Cx to Dist Cx lesion is 100% stenosed.  RV Branch lesion is 90% stenosed.  2nd RPL lesion is 70% stenosed.  The left ventricular systolic function is normal.  LV end diastolic pressure is normal.  The left ventricular ejection fraction is 50-55% by visual estimate.  Post intervention, there is a 40% residual stenosis.  Balloon  angioplasty was performed using a BALLOON SAPPHIRE 2.5X12.  1. Severe 2 vessel obstructive CAD. There is severe diffuse segmental disease in the proximal to mid LAD 90% 100% occlusion of the LCx distally with thrombus 2. EF 50-55% with mid inferior HK 3. Normal LV EDP 4. Successful PCI of the distal LCx with POBA.  Plan: will resume IV heparin in 8 hours. Continue IV Aggrastat. Initiate beta blocker and high dose statin. Continue IV Ntg. Will consult CT surgery for CABG this admission given severe LAD disease. The LAD and OM 2 and 3 appear to be acceptable targets.    ECHO 10/01/2020  1. Left ventricular ejection fraction, by estimation, is 45 to 50%. The  left ventricle has mildly decreased function. The left ventricle has no  regional wall motion abnormalities. There is mild concentric left  ventricular hypertrophy. Left ventricular  diastolic parameters are consistent with Grade I diastolic dysfunction  (impaired relaxation). There is moderate hypokinesis of the left  ventricular, mid-apical anterior wall. There is moderate hypokinesis of  the left ventricular, entire inferolateral  wall.  2. Right ventricular systolic function is normal. The right ventricular  size is normal.  3. Left atrial size was mildly dilated.  4. The mitral valve is normal in structure. No evidence of mitral valve  regurgitation. No evidence of mitral stenosis.  5. The aortic valve is tricuspid. Aortic valve regurgitation is not  visualized. No aortic stenosis is present.  6. There is mild dilatation of the ascending aorta, measuring 42 mm.  7. The inferior vena cava is normal in size with greater than 50%  respiratory variability, suggesting right atrial pressure of 3 mmHg.    Patient Profile     60 y.o. male previously healthy other than obesity and GERD, presents with ECG suggestive of acute inferior STEMI and found to have severe obstructive two-vessel disease, with plain balloon  angioplasty of the culprit distal left circumflex lesion, plan for subsequent bypass surgery.  Assessment & Plan    Remains asymptomatic on intravenous Aggrastat and heparin.  We will DC the nitroglycerin.  No evidence of CHF. Marked reduction of the burden of ventricular arrhythmia.  Continue beta-blocker and high-dose statin. Plan for coronary artery bypass surgery early this week.  For questions or updates, please contact [place] Please consult [email or web address] for contact info under        Signed, Sanda Klein, MD  10/02/2020, 9:52 AM

## 2020-10-02 NOTE — Progress Notes (Signed)
[place] for Heparin Indication: chest pain/ACS  No Known Allergies  Patient Measurements: Height: 5\' 8"  (172.7 cm) Weight: 117 kg (257 lb 15 oz) IBW/kg (Calculated) : 68.4 Heparin Dosing Weight: 93.9 kg  Vital Signs: Temp: 98.3 F (36.8 C) (06/05 1100) Temp Source: Oral (06/05 0400) BP: 138/75 (06/05 1300) Pulse Rate: 68 (06/05 1300)  Labs: Recent Labs    09/30/20 1403 09/30/20 1403 09/30/20 1525 09/30/20 1906 09/30/20 2247 10/01/20 0656 10/01/20 1523 10/02/20 0128 10/02/20 1228  HGB 15.8  --   --   --   --  13.7  --   --  14.1  HCT 46.2  --   --   --   --  41.0  --   --  41.6  PLT 271  --   --   --   --  258  --   --  262  APTT  --   --   --  81*  --   --   --   --   --   LABPROT  --   --   --  13.5  --   --   --   --   --   INR  --   --   --  1.0  --   --   --   --   --   HEPARINUNFRC  --    < >  --   --   --  0.20* 0.13* 0.33 0.31  CREATININE 1.10  --   --   --   --  1.01  --   --   --   TROPONINIHS 36*  --  65* 9,368* >24,000*  --   --   --   --    < > = values in this interval not displayed.    Estimated Creatinine Clearance: 96.6 mL/min (by C-G formula based on SCr of 1.01 mg/dL).   Medical History: Past Medical History:  Diagnosis Date  . Cancer ([place]) 2010   skin cancer, Basal Cell Carcinoma   . COVID-19 07/2019  . GERD (gastroesophageal reflux disease)     Medications:  Medications Prior to Admission  Medication Sig Dispense Refill Last Dose  . aspirin EC 81 MG tablet Take 324 mg by mouth daily as needed for mild pain. Swallow whole.   09/30/2020 at Unknown time  . famotidine (PEPCID) 10 MG tablet Take 10 mg by mouth daily.   09/29/2020 at Unknown time  . naproxen (NAPROSYN) 500 MG tablet Take 1 tablet (500 mg total) by mouth 2 (two) times daily. (Patient taking differently: Take 500 mg by mouth daily.) 30 tablet 0 09/29/2020 at Unknown time  . Simethicone (GAS-X PO) Take 2 tablets by mouth daily as needed  (bloating).   09/30/2020 at Unknown time  . HYDROcodone-acetaminophen (NORCO/VICODIN) 5-325 MG tablet Take 1 tablet by mouth every 6 (six) hours as needed for severe pain. (Patient not taking: No sig reported) 12 tablet 0 Not Taking at Unknown time  . ondansetron (ZOFRAN ODT) 4 MG disintegrating tablet Take 1 tablet (4 mg total) by mouth every 8 (eight) hours as needed for nausea or vomiting. (Patient not taking: No sig reported) 20 tablet 0 Not Taking at Unknown time   Infusions:  . sodium chloride    . sodium chloride    . [START ON 10/03/2020]  ceFAZolin (ANCEF) IV    . [START ON 10/03/2020]  ceFAZolin (ANCEF) IV    . [  START ON 10/03/2020] dexmedetomidine    . [START ON 10/03/2020] heparin 30,000 units/NS 1000 mL solution for CELLSAVER    . heparin 1,500 Units/hr (10/02/20 1300)  . [START ON 10/03/2020] milrinone    . nitroGLYCERIN Stopped (10/02/20 0948)  . [START ON 10/03/2020] nitroGLYCERIN    . [START ON 10/03/2020] norepinephrine    . tirofiban 0.15 mcg/kg/min (10/02/20 1402)  . [START ON 10/03/2020] tranexamic acid (CYKLOKAPRON) infusion ([facility])    . [START ON 10/03/2020] vancomycin      Assessment: 59 yom that presented to [facility] ED with acute onset chest pain. Found to have ST elevations and Code Stemi activated and transported to [facility] cath lab. 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 Pharmacy asked to start IV heparin and tirofiban.  For CABG tomorrow morning. Aggrastat to stop tonight at 1800. Confirmatory heparin level therapeutic 0.31 but on lower end of therapeutic, on drip rate 1500 units/hr. Will continue rate since patient going for CABG tomorrow. CBC stable, no overt bleeding or infusion issues noted.  Goal of Therapy:  Heparin level 0.3-0.5 units/ml while on tirofiban Monitor platelets by anticoagulation protocol: Yes   Plan:  Cont heparin 1500 units/hr Aggrastat stop time entered for 10/02/20 at 1800 F/u  post-op tomorrow  Richardine Service, PharmD, BCPS PGY2 Cardiology Pharmacy Resident Phone: [phone] 10/02/2020  2:06 PM  Please check [email or web address] for unit-specific pharmacy phone numbers.

## 2020-10-02 NOTE — Progress Notes (Signed)
ANTICOAGULATION CONSULT NOTE  Pharmacy Consult for Heparin Indication: chest pain/ACS  No Known Allergies  Patient Measurements: Height: 5\' 8"  (172.7 cm) Weight: 113.4 kg (250 lb) IBW/kg (Calculated) : 68.4 Heparin Dosing Weight: 93.9 kg  Vital Signs: Temp: 98.9 F (37.2 C) (06/04 2357) Temp Source: Oral (06/04 2357) BP: 107/78 (06/05 0300) Pulse Rate: 65 (06/05 0300)  Labs: Recent Labs    09/30/20 1403 09/30/20 1525 09/30/20 1906 09/30/20 2247 10/01/20 0656 10/01/20 1523 10/02/20 0128  HGB 15.8  --   --   --  13.7  --   --   HCT 46.2  --   --   --  41.0  --   --   PLT 271  --   --   --  258  --   --   APTT  --   --  81*  --   --   --   --   LABPROT  --   --  13.5  --   --   --   --   INR  --   --  1.0  --   --   --   --   HEPARINUNFRC  --   --   --   --  0.20* 0.13* 0.33  CREATININE 1.10  --   --   --  1.01  --   --   TROPONINIHS 36* 65* 9,368* >24,000*  --   --   --     Estimated Creatinine Clearance: 95 mL/min (by C-G formula based on SCr of 1.01 mg/dL).   Medical History: Past Medical History:  Diagnosis Date  . Cancer ([place]) 2010   skin cancer, Basal Cell Carcinoma   . COVID-19 07/2019  . GERD (gastroesophageal reflux disease)     Medications:  Medications Prior to Admission  Medication Sig Dispense Refill Last Dose  . aspirin EC 81 MG tablet Take 324 mg by mouth daily as needed for mild pain. Swallow whole.   09/30/2020 at Unknown time  . famotidine (PEPCID) 10 MG tablet Take 10 mg by mouth daily.   09/29/2020 at Unknown time  . naproxen (NAPROSYN) 500 MG tablet Take 1 tablet (500 mg total) by mouth 2 (two) times daily. (Patient taking differently: Take 500 mg by mouth daily.) 30 tablet 0 09/29/2020 at Unknown time  . Simethicone (GAS-X PO) Take 2 tablets by mouth daily as needed (bloating).   09/30/2020 at Unknown time  . HYDROcodone-acetaminophen (NORCO/VICODIN) 5-325 MG tablet Take 1 tablet by mouth every 6 (six) hours as needed for severe pain. (Patient  not taking: No sig reported) 12 tablet 0 Not Taking at Unknown time  . ondansetron (ZOFRAN ODT) 4 MG disintegrating tablet Take 1 tablet (4 mg total) by mouth every 8 (eight) hours as needed for nausea or vomiting. (Patient not taking: No sig reported) 20 tablet 0 Not Taking at Unknown time   Infusions:  . sodium chloride    . sodium chloride    . heparin 1,500 Units/hr (10/02/20 0300)  . nitroGLYCERIN 15 mcg/min (10/02/20 0300)  . tirofiban 0.15 mcg/kg/min (10/02/20 0313)    Assessment: 59 yom that presented to [facility] ED with acute onset chest pain. Found to have ST elevations and Code Stemi activated and transported to [facility] cath lab. 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 Awaiting TCTS eval for potential CABG. Pharmacy asked to start IV heparin and tirofiban -  heparin level 0.13 on heparin 1300 units/hr   6/5 AM update:  Heparin level therapeutic after rate increase  Goal of Therapy:  Heparin level 0.3-0.5 units/ml while on tirofiban Monitor platelets by anticoagulation protocol: Yes   Plan:  Cont heparin 1500 units/hr Confirmatory heparin level at [place], PharmD, [place] Pharmacist Phone: [phone]

## 2020-10-02 NOTE — Progress Notes (Signed)
     [address]       ,[place] [id number]             [id number]       No events Pain well controlled  Vitals:   10/02/20 0700 10/02/20 0730  BP: 126/62   Pulse: 79 87  Resp: 20 (!) 27  Temp: 97.6 F (36.4 C)   SpO2: 97% 96%   Alert NAD Sinus EWOB  OR tomorrow for CABG, left radial artery harvest Will hold aggrestat at 6pm

## 2020-10-03 ENCOUNTER — Inpatient Hospital Stay (HOSPITAL_COMMUNITY)
Admission: EM | Disposition: A | Discharge status: Home / Self Care | Payer: Self-pay | Source: Home / Self Care | Attending: Thoracic Surgery (Cardiothoracic Vascular Surgery)

## 2020-10-03 ENCOUNTER — Inpatient Hospital Stay (HOSPITAL_COMMUNITY): Payer: 59

## 2020-10-03 ENCOUNTER — Inpatient Hospital Stay (HOSPITAL_COMMUNITY): Payer: 59 | Admitting: Anesthesiology

## 2020-10-03 ENCOUNTER — Encounter (HOSPITAL_COMMUNITY): Payer: Self-pay | Admitting: Cardiology

## 2020-10-03 DIAGNOSIS — Z951 Presence of aortocoronary bypass graft: Secondary | ICD-10-CM

## 2020-10-03 DIAGNOSIS — I213 ST elevation (STEMI) myocardial infarction of unspecified site: Secondary | ICD-10-CM

## 2020-10-03 DIAGNOSIS — I2510 Atherosclerotic heart disease of native coronary artery without angina pectoris: Secondary | ICD-10-CM

## 2020-10-03 HISTORY — PX: CORONARY ARTERY BYPASS GRAFT: SHX141

## 2020-10-03 LAB — POCT I-STAT EG7
Acid-base deficit: 2 mmol/L (ref 0.0–2.0)
Bicarbonate: 24.9 mmol/L (ref 20.0–28.0)
Calcium, Ion: 1.08 mmol/L — ABNORMAL LOW (ref 1.15–1.40)
HCT: 31 % — ABNORMAL LOW (ref 39.0–52.0)
Hemoglobin: 10.5 g/dL — ABNORMAL LOW (ref 13.0–17.0)
O2 Saturation: 83 %
Potassium: 5.3 mmol/L — ABNORMAL HIGH (ref 3.5–5.1)
Sodium: 138 mmol/L (ref 135–145)
TCO2: 26 mmol/L (ref 22–32)
pCO2, Ven: 51.7 mmHg (ref 44.0–60.0)
pH, Ven: 7.291 (ref 7.250–7.430)
pO2, Ven: 54 mmHg — ABNORMAL HIGH (ref 32.0–45.0)

## 2020-10-03 LAB — POCT I-STAT, CHEM 8
BUN: 18 mg/dL (ref 6–20)
BUN: 18 mg/dL (ref 6–20)
BUN: 18 mg/dL (ref 6–20)
BUN: 18 mg/dL (ref 6–20)
Calcium, Ion: 1.2 mmol/L (ref 1.15–1.40)
Calcium, Ion: 1.15 mmol/L (ref 1.15–1.40)
Calcium, Ion: 1.06 mmol/L — ABNORMAL LOW (ref 1.15–1.40)
Calcium, Ion: 1.03 mmol/L — ABNORMAL LOW (ref 1.15–1.40)
Chloride: 103 mmol/L (ref 98–111)
Chloride: 103 mmol/L (ref 98–111)
Chloride: 104 mmol/L (ref 98–111)
Chloride: 105 mmol/L (ref 98–111)
Creatinine, Ser: 1.1 mg/dL (ref 0.61–1.24)
Creatinine, Ser: 1.1 mg/dL (ref 0.61–1.24)
Creatinine, Ser: 1.1 mg/dL (ref 0.61–1.24)
Creatinine, Ser: 1 mg/dL (ref 0.61–1.24)
Glucose, Bld: 245 mg/dL — ABNORMAL HIGH (ref 70–99)
Glucose, Bld: 198 mg/dL — ABNORMAL HIGH (ref 70–99)
Glucose, Bld: 172 mg/dL — ABNORMAL HIGH (ref 70–99)
Glucose, Bld: 166 mg/dL — ABNORMAL HIGH (ref 70–99)
HCT: 41 % (ref 39.0–52.0)
HCT: 38 % — ABNORMAL LOW (ref 39.0–52.0)
HCT: 31 % — ABNORMAL LOW (ref 39.0–52.0)
HCT: 33 % — ABNORMAL LOW (ref 39.0–52.0)
Hemoglobin: 13.9 g/dL (ref 13.0–17.0)
Hemoglobin: 12.9 g/dL — ABNORMAL LOW (ref 13.0–17.0)
Hemoglobin: 10.5 g/dL — ABNORMAL LOW (ref 13.0–17.0)
Hemoglobin: 11.2 g/dL — ABNORMAL LOW (ref 13.0–17.0)
Potassium: 3.8 mmol/L (ref 3.5–5.1)
Potassium: 4.1 mmol/L (ref 3.5–5.1)
Potassium: 5.1 mmol/L (ref 3.5–5.1)
Potassium: 5.1 mmol/L (ref 3.5–5.1)
Sodium: 138 mmol/L (ref 135–145)
Sodium: 137 mmol/L (ref 135–145)
Sodium: 137 mmol/L (ref 135–145)
Sodium: 136 mmol/L (ref 135–145)
TCO2: 22 mmol/L (ref 22–32)
TCO2: 23 mmol/L (ref 22–32)
TCO2: 25 mmol/L (ref 22–32)
TCO2: 23 mmol/L (ref 22–32)

## 2020-10-03 LAB — POCT I-STAT 7, (LYTES, BLD GAS, ICA,H+H)
Acid-base deficit: 8 mmol/L — ABNORMAL HIGH (ref 0.0–2.0)
Acid-base deficit: 1 mmol/L (ref 0.0–2.0)
Acid-base deficit: 2 mmol/L (ref 0.0–2.0)
Acid-base deficit: 6 mmol/L — ABNORMAL HIGH (ref 0.0–2.0)
Acid-base deficit: 6 mmol/L — ABNORMAL HIGH (ref 0.0–2.0)
Acid-base deficit: 9 mmol/L — ABNORMAL HIGH (ref 0.0–2.0)
Acid-base deficit: 6 mmol/L — ABNORMAL HIGH (ref 0.0–2.0)
Acid-base deficit: 9 mmol/L — ABNORMAL HIGH (ref 0.0–2.0)
Acid-base deficit: 10 mmol/L — ABNORMAL HIGH (ref 0.0–2.0)
Bicarbonate: 20.2 mmol/L (ref 20.0–28.0)
Bicarbonate: 25.2 mmol/L (ref 20.0–28.0)
Bicarbonate: 23.5 mmol/L (ref 20.0–28.0)
Bicarbonate: 21.4 mmol/L (ref 20.0–28.0)
Bicarbonate: 20.3 mmol/L (ref 20.0–28.0)
Bicarbonate: 22 mmol/L (ref 20.0–28.0)
Bicarbonate: 19.7 mmol/L — ABNORMAL LOW (ref 20.0–28.0)
Bicarbonate: 16.1 mmol/L — ABNORMAL LOW (ref 20.0–28.0)
Bicarbonate: 15.8 mmol/L — ABNORMAL LOW (ref 20.0–28.0)
Calcium, Ion: 1.2 mmol/L (ref 1.15–1.40)
Calcium, Ion: 1.01 mmol/L — ABNORMAL LOW (ref 1.15–1.40)
Calcium, Ion: 1.06 mmol/L — ABNORMAL LOW (ref 1.15–1.40)
Calcium, Ion: 1.06 mmol/L — ABNORMAL LOW (ref 1.15–1.40)
Calcium, Ion: 1.01 mmol/L — ABNORMAL LOW (ref 1.15–1.40)
Calcium, Ion: 1.12 mmol/L — ABNORMAL LOW (ref 1.15–1.40)
Calcium, Ion: 1.01 mmol/L — ABNORMAL LOW (ref 1.15–1.40)
Calcium, Ion: 1 mmol/L — ABNORMAL LOW (ref 1.15–1.40)
Calcium, Ion: 1.01 mmol/L — ABNORMAL LOW (ref 1.15–1.40)
HCT: 44 % (ref 39.0–52.0)
HCT: 32 % — ABNORMAL LOW (ref 39.0–52.0)
HCT: 29 % — ABNORMAL LOW (ref 39.0–52.0)
HCT: 35 % — ABNORMAL LOW (ref 39.0–52.0)
HCT: 36 % — ABNORMAL LOW (ref 39.0–52.0)
HCT: 37 % — ABNORMAL LOW (ref 39.0–52.0)
HCT: 37 % — ABNORMAL LOW (ref 39.0–52.0)
HCT: 36 % — ABNORMAL LOW (ref 39.0–52.0)
HCT: 37 % — ABNORMAL LOW (ref 39.0–52.0)
Hemoglobin: 15 g/dL (ref 13.0–17.0)
Hemoglobin: 10.9 g/dL — ABNORMAL LOW (ref 13.0–17.0)
Hemoglobin: 9.9 g/dL — ABNORMAL LOW (ref 13.0–17.0)
Hemoglobin: 11.9 g/dL — ABNORMAL LOW (ref 13.0–17.0)
Hemoglobin: 12.2 g/dL — ABNORMAL LOW (ref 13.0–17.0)
Hemoglobin: 12.6 g/dL — ABNORMAL LOW (ref 13.0–17.0)
Hemoglobin: 12.6 g/dL — ABNORMAL LOW (ref 13.0–17.0)
Hemoglobin: 12.2 g/dL — ABNORMAL LOW (ref 13.0–17.0)
Hemoglobin: 12.6 g/dL — ABNORMAL LOW (ref 13.0–17.0)
O2 Saturation: 100 %
O2 Saturation: 100 %
O2 Saturation: 100 %
O2 Saturation: 100 %
O2 Saturation: 92 %
O2 Saturation: 92 %
O2 Saturation: 89 %
O2 Saturation: 94 %
O2 Saturation: 91 %
Patient temperature: 36
Patient temperature: 35.9
Patient temperature: 36.9
Potassium: 3.7 mmol/L (ref 3.5–5.1)
Potassium: 4.2 mmol/L (ref 3.5–5.1)
Potassium: 5.8 mmol/L — ABNORMAL HIGH (ref 3.5–5.1)
Potassium: 5.1 mmol/L (ref 3.5–5.1)
Potassium: 3.9 mmol/L (ref 3.5–5.1)
Potassium: 4.1 mmol/L (ref 3.5–5.1)
Potassium: 3.8 mmol/L (ref 3.5–5.1)
Potassium: 3.4 mmol/L — ABNORMAL LOW (ref 3.5–5.1)
Potassium: 3.5 mmol/L (ref 3.5–5.1)
Sodium: 137 mmol/L (ref 135–145)
Sodium: 138 mmol/L (ref 135–145)
Sodium: 134 mmol/L — ABNORMAL LOW (ref 135–145)
Sodium: 137 mmol/L (ref 135–145)
Sodium: 138 mmol/L (ref 135–145)
Sodium: 142 mmol/L (ref 135–145)
Sodium: 142 mmol/L (ref 135–145)
Sodium: 142 mmol/L (ref 135–145)
Sodium: 142 mmol/L (ref 135–145)
TCO2: 22 mmol/L (ref 22–32)
TCO2: 27 mmol/L (ref 22–32)
TCO2: 25 mmol/L (ref 22–32)
TCO2: 23 mmol/L (ref 22–32)
TCO2: 22 mmol/L (ref 22–32)
TCO2: 24 mmol/L (ref 22–32)
TCO2: 21 mmol/L — ABNORMAL LOW (ref 22–32)
TCO2: 17 mmol/L — ABNORMAL LOW (ref 22–32)
TCO2: 17 mmol/L — ABNORMAL LOW (ref 22–32)
pCO2 arterial: 52.2 mmHg — ABNORMAL HIGH (ref 32.0–48.0)
pCO2 arterial: 47.4 mmHg (ref 32.0–48.0)
pCO2 arterial: 40.6 mmHg (ref 32.0–48.0)
pCO2 arterial: 48 mmHg (ref 32.0–48.0)
pCO2 arterial: 52.6 mmHg — ABNORMAL HIGH (ref 32.0–48.0)
pCO2 arterial: 54.8 mmHg — ABNORMAL HIGH (ref 32.0–48.0)
pCO2 arterial: 38.3 mmHg (ref 32.0–48.0)
pCO2 arterial: 31.9 mmHg — ABNORMAL LOW (ref 32.0–48.0)
pCO2 arterial: 32.6 mmHg (ref 32.0–48.0)
pH, Arterial: 7.196 — CL (ref 7.350–7.450)
pH, Arterial: 7.334 — ABNORMAL LOW (ref 7.350–7.450)
pH, Arterial: 7.371 (ref 7.350–7.450)
pH, Arterial: 7.256 — ABNORMAL LOW (ref 7.350–7.450)
pH, Arterial: 7.171 — CL (ref 7.350–7.450)
pH, Arterial: 7.23 — ABNORMAL LOW (ref 7.350–7.450)
pH, Arterial: 7.313 — ABNORMAL LOW (ref 7.350–7.450)
pH, Arterial: 7.313 — ABNORMAL LOW (ref 7.350–7.450)
pH, Arterial: 7.292 — ABNORMAL LOW (ref 7.350–7.450)
pO2, Arterial: 262 mmHg — ABNORMAL HIGH (ref 83.0–108.0)
pO2, Arterial: 313 mmHg — ABNORMAL HIGH (ref 83.0–108.0)
pO2, Arterial: 368 mmHg — ABNORMAL HIGH (ref 83.0–108.0)
pO2, Arterial: 238 mmHg — ABNORMAL HIGH (ref 83.0–108.0)
pO2, Arterial: 75 mmHg — ABNORMAL LOW (ref 83.0–108.0)
pO2, Arterial: 78 mmHg — ABNORMAL LOW (ref 83.0–108.0)
pO2, Arterial: 57 mmHg — ABNORMAL LOW (ref 83.0–108.0)
pO2, Arterial: 77 mmHg — ABNORMAL LOW (ref 83.0–108.0)
pO2, Arterial: 68 mmHg — ABNORMAL LOW (ref 83.0–108.0)

## 2020-10-03 LAB — CBC
HCT: 41.4 % (ref 39.0–52.0)
HCT: 38.5 % — ABNORMAL LOW (ref 39.0–52.0)
HCT: 37.5 % — ABNORMAL LOW (ref 39.0–52.0)
Hemoglobin: 13.9 g/dL (ref 13.0–17.0)
Hemoglobin: 12.6 g/dL — ABNORMAL LOW (ref 13.0–17.0)
Hemoglobin: 12.2 g/dL — ABNORMAL LOW (ref 13.0–17.0)
MCH: 31.2 pg (ref 26.0–34.0)
MCH: 31.6 pg (ref 26.0–34.0)
MCH: 30.7 pg (ref 26.0–34.0)
MCHC: 33.6 g/dL (ref 30.0–36.0)
MCHC: 32.7 g/dL (ref 30.0–36.0)
MCHC: 32.5 g/dL (ref 30.0–36.0)
MCV: 92.8 fL (ref 80.0–100.0)
MCV: 96.5 fL (ref 80.0–100.0)
MCV: 94.5 fL (ref 80.0–100.0)
Platelets: 252 10*3/uL (ref 150–400)
Platelets: 209 10*3/uL (ref 150–400)
Platelets: 257 10*3/uL (ref 150–400)
RBC: 4.46 MIL/uL (ref 4.22–5.81)
RBC: 3.99 MIL/uL — ABNORMAL LOW (ref 4.22–5.81)
RBC: 3.97 MIL/uL — ABNORMAL LOW (ref 4.22–5.81)
RDW: 13 % (ref 11.5–15.5)
RDW: 12.9 % (ref 11.5–15.5)
RDW: 13 % (ref 11.5–15.5)
WBC: 14.3 10*3/uL — ABNORMAL HIGH (ref 4.0–10.5)
WBC: 31.8 10*3/uL — ABNORMAL HIGH (ref 4.0–10.5)
WBC: 22.2 10*3/uL — ABNORMAL HIGH (ref 4.0–10.5)
nRBC: 0 % (ref 0.0–0.2)
nRBC: 0 % (ref 0.0–0.2)
nRBC: 0 % (ref 0.0–0.2)

## 2020-10-03 LAB — BASIC METABOLIC PANEL
Anion gap: 16 — ABNORMAL HIGH (ref 5–15)
BUN: 18 mg/dL (ref 6–20)
CO2: 19 mmol/L — ABNORMAL LOW (ref 22–32)
Calcium: 7.4 mg/dL — ABNORMAL LOW (ref 8.9–10.3)
Chloride: 106 mmol/L (ref 98–111)
Creatinine, Ser: 1.51 mg/dL — ABNORMAL HIGH (ref 0.61–1.24)
GFR, Estimated: 53 mL/min — ABNORMAL LOW (ref >60)
Glucose, Bld: 240 mg/dL — ABNORMAL HIGH (ref 70–99)
Potassium: 3.4 mmol/L — ABNORMAL LOW (ref 3.5–5.1)
Sodium: 141 mmol/L (ref 135–145)

## 2020-10-03 LAB — GLUCOSE, CAPILLARY
Glucose-Capillary: 107 mg/dL — ABNORMAL HIGH (ref 70–99)
Glucose-Capillary: 150 mg/dL — ABNORMAL HIGH (ref 70–99)
Glucose-Capillary: 160 mg/dL — ABNORMAL HIGH (ref 70–99)
Glucose-Capillary: 177 mg/dL — ABNORMAL HIGH (ref 70–99)
Glucose-Capillary: 185 mg/dL — ABNORMAL HIGH (ref 70–99)
Glucose-Capillary: 187 mg/dL — ABNORMAL HIGH (ref 70–99)
Glucose-Capillary: 197 mg/dL — ABNORMAL HIGH (ref 70–99)
Glucose-Capillary: 200 mg/dL — ABNORMAL HIGH (ref 70–99)
Glucose-Capillary: 211 mg/dL — ABNORMAL HIGH (ref 70–99)
Glucose-Capillary: 215 mg/dL — ABNORMAL HIGH (ref 70–99)
Glucose-Capillary: 219 mg/dL — ABNORMAL HIGH (ref 70–99)
Glucose-Capillary: 98 mg/dL (ref 70–99)

## 2020-10-03 LAB — ECHO INTRAOPERATIVE TEE
Height: 68 in
S' Lateral: 4.3 cm
Weight: 4077.63 oz

## 2020-10-03 LAB — COMPREHENSIVE METABOLIC PANEL
ALT: 36 U/L (ref 0–44)
AST: 56 U/L — ABNORMAL HIGH (ref 15–41)
Albumin: 3.2 g/dL — ABNORMAL LOW (ref 3.5–5.0)
Alkaline Phosphatase: 43 U/L (ref 38–126)
Anion gap: 7 (ref 5–15)
BUN: 15 mg/dL (ref 6–20)
CO2: 26 mmol/L (ref 22–32)
Calcium: 8.8 mg/dL — ABNORMAL LOW (ref 8.9–10.3)
Chloride: 106 mmol/L (ref 98–111)
Creatinine, Ser: 1.09 mg/dL (ref 0.61–1.24)
GFR, Estimated: >60 mL/min (ref >60)
Glucose, Bld: 113 mg/dL — ABNORMAL HIGH (ref 70–99)
Potassium: 4.1 mmol/L (ref 3.5–5.1)
Sodium: 139 mmol/L (ref 135–145)
Total Bilirubin: 1.1 mg/dL (ref 0.3–1.2)
Total Protein: 6.1 g/dL — ABNORMAL LOW (ref 6.5–8.1)

## 2020-10-03 LAB — PLATELET COUNT: Platelets: 209 10*3/uL (ref 150–400)

## 2020-10-03 LAB — HEMOGLOBIN A1C
Hgb A1c MFr Bld: 5.9 % — ABNORMAL HIGH (ref 4.8–5.6)
Hgb A1c MFr Bld: 6 % — ABNORMAL HIGH (ref 4.8–5.6)
Hgb A1c MFr Bld: 6 % — ABNORMAL HIGH (ref 4.8–5.6)
Mean Plasma Glucose: 123 mg/dL
Mean Plasma Glucose: 126 mg/dL
Mean Plasma Glucose: 126 mg/dL

## 2020-10-03 LAB — APTT: aPTT: 27 seconds (ref 24–36)

## 2020-10-03 LAB — PROTIME-INR
INR: 1.4 — ABNORMAL HIGH (ref 0.8–1.2)
Prothrombin Time: 17.2 seconds — ABNORMAL HIGH (ref 11.4–15.2)

## 2020-10-03 LAB — MAGNESIUM: Magnesium: 3.2 mg/dL — ABNORMAL HIGH (ref 1.7–2.4)

## 2020-10-03 LAB — HEMOGLOBIN AND HEMATOCRIT, BLOOD
HCT: 30.1 % — ABNORMAL LOW (ref 39.0–52.0)
Hemoglobin: 10.1 g/dL — ABNORMAL LOW (ref 13.0–17.0)

## 2020-10-03 SURGERY — CORONARY ARTERY BYPASS GRAFTING (CABG)
Anesthesia: General | Site: Chest

## 2020-10-03 MED ORDER — ASPIRIN EC 325 MG PO TBEC
325.0000 mg | DELAYED_RELEASE_TABLET | Freq: Every day | ORAL | Status: DC
  Administered 2020-10-04 – 2020-10-06 (×3): 325 mg via ORAL
  Filled 2020-10-03 (×3): qty 1

## 2020-10-03 MED ORDER — ONDANSETRON HCL 4 MG/2ML IJ SOLN: 4.0000 mg | Freq: Four times a day (QID) | INTRAMUSCULAR | Status: DC | PRN

## 2020-10-03 MED ORDER — EPINEPHRINE 1 MG/10ML IJ SOSY
PREFILLED_SYRINGE | INTRAMUSCULAR | Status: DC | PRN
  Administered 2020-10-03: 1 mg via INTRAVENOUS

## 2020-10-03 MED ORDER — CHLORHEXIDINE GLUCONATE CLOTH 2 % EX PADS
6.0000 | MEDICATED_PAD | Freq: Every day | CUTANEOUS | Status: DC
  Administered 2020-10-03 – 2020-10-06 (×3): 6 via TOPICAL

## 2020-10-03 MED ORDER — TRAMADOL HCL 50 MG PO TABS
50.0000 mg | ORAL_TABLET | ORAL | Status: DC | PRN
  Administered 2020-10-04 (×2): 100 mg via ORAL
  Filled 2020-10-03 (×2): qty 2

## 2020-10-03 MED ORDER — SODIUM BICARBONATE 8.4 % IV SOLN
100.0000 meq | Freq: Once | INTRAVENOUS | Status: AC
  Administered 2020-10-03: 100 meq via INTRAVENOUS

## 2020-10-03 MED ORDER — HEPARIN SODIUM (PORCINE) 1000 UNIT/ML IJ SOLN
INTRAMUSCULAR | Status: DC | PRN
  Administered 2020-10-03: 2000 [IU] via INTRAVENOUS
  Administered 2020-10-03: 5000 [IU] via INTRAVENOUS
  Administered 2020-10-03: 38000 [IU] via INTRAVENOUS

## 2020-10-03 MED ORDER — PANTOPRAZOLE SODIUM 40 MG PO TBEC
40.0000 mg | DELAYED_RELEASE_TABLET | Freq: Every day | ORAL | Status: DC
  Administered 2020-10-05 – 2020-10-07 (×3): 40 mg via ORAL
  Filled 2020-10-03 (×3): qty 1

## 2020-10-03 MED ORDER — ATORVASTATIN CALCIUM 80 MG PO TABS
80.0000 mg | ORAL_TABLET | Freq: Every day | ORAL | Status: DC
  Administered 2020-10-04 – 2020-10-07 (×4): 80 mg via ORAL
  Filled 2020-10-03 (×4): qty 1

## 2020-10-03 MED ORDER — DEXAMETHASONE SODIUM PHOSPHATE 10 MG/ML IJ SOLN
INTRAMUSCULAR | Status: DC | PRN
  Administered 2020-10-03: 10 mg via INTRAVENOUS

## 2020-10-03 MED ORDER — SODIUM CHLORIDE 0.9% FLUSH: 3.0000 mL | INTRAVENOUS | Status: DC | PRN

## 2020-10-03 MED ORDER — MAGNESIUM SULFATE 4 GM/100ML IV SOLN
4.0000 g | Freq: Once | INTRAVENOUS | Status: AC
  Administered 2020-10-03: 4 g via INTRAVENOUS
  Filled 2020-10-03: qty 100

## 2020-10-03 MED ORDER — CHLORHEXIDINE GLUCONATE 0.12 % MT SOLN
15.0000 mL | OROMUCOSAL | Status: AC
  Administered 2020-10-03: 15 mL via OROMUCOSAL

## 2020-10-03 MED ORDER — ACETAMINOPHEN 160 MG/5ML PO SOLN: 1000.0000 mg | Freq: Four times a day (QID) | ORAL | Status: DC

## 2020-10-03 MED ORDER — HEMOSTATIC AGENTS (NO CHARGE) OPTIME
TOPICAL | Status: DC | PRN
  Administered 2020-10-03: 1 via TOPICAL

## 2020-10-03 MED ORDER — POTASSIUM CHLORIDE 10 MEQ/50ML IV SOLN
10.0000 meq | INTRAVENOUS | Status: AC
  Administered 2020-10-03 (×3): 10 meq via INTRAVENOUS
  Filled 2020-10-03 (×3): qty 50

## 2020-10-03 MED ORDER — HEPARIN SODIUM (PORCINE) 1000 UNIT/ML IJ SOLN
INTRAMUSCULAR | Status: AC
  Filled 2020-10-03: qty 1

## 2020-10-03 MED ORDER — ACETAMINOPHEN 650 MG RE SUPP: 650.0000 mg | Freq: Once | RECTAL | Status: AC

## 2020-10-03 MED ORDER — PROPOFOL 10 MG/ML IV BOLUS
INTRAVENOUS | Status: AC
  Filled 2020-10-03: qty 20

## 2020-10-03 MED ORDER — PHENYLEPHRINE HCL (PRESSORS) 10 MG/ML IV SOLN
INTRAVENOUS | Status: AC
  Filled 2020-10-03: qty 2

## 2020-10-03 MED ORDER — SODIUM CHLORIDE 0.45 % IV SOLN
INTRAVENOUS | Status: DC | PRN
  Administered 2020-10-03: 20 mL/h via INTRAVENOUS

## 2020-10-03 MED ORDER — METHYLPREDNISOLONE SODIUM SUCC 125 MG IJ SOLR
60.0000 mg | Freq: Once | INTRAMUSCULAR | Status: AC
  Administered 2020-10-03: 60 mg via INTRAVENOUS
  Filled 2020-10-03: qty 2

## 2020-10-03 MED ORDER — SODIUM BICARBONATE 8.4 % IV SOLN
INTRAVENOUS | Status: AC
  Filled 2020-10-03: qty 50

## 2020-10-03 MED ORDER — BISACODYL 10 MG RE SUPP
10.0000 mg | Freq: Every day | RECTAL | Status: DC
  Filled 2020-10-03: qty 1

## 2020-10-03 MED ORDER — HEPARIN SODIUM (PORCINE) 1000 UNIT/ML IJ SOLN
INTRAMUSCULAR | Status: AC
  Filled 2020-10-03: qty 2

## 2020-10-03 MED ORDER — CEFAZOLIN SODIUM-DEXTROSE 2-4 GM/100ML-% IV SOLN
2.0000 g | Freq: Three times a day (TID) | INTRAVENOUS | Status: AC
  Administered 2020-10-03 – 2020-10-05 (×6): 2 g via INTRAVENOUS
  Filled 2020-10-03 (×7): qty 100

## 2020-10-03 MED ORDER — SODIUM CHLORIDE 0.9 % IV SOLN: 250.0000 mL | INTRAVENOUS | Status: DC

## 2020-10-03 MED ORDER — DEXMEDETOMIDINE HCL IN NACL 400 MCG/100ML IV SOLN
0.0000 ug/kg/h | INTRAVENOUS | Status: DC
  Administered 2020-10-03: 0.5 ug/kg/h via INTRAVENOUS
  Filled 2020-10-03: qty 100

## 2020-10-03 MED ORDER — SUCCINYLCHOLINE CHLORIDE 200 MG/10ML IV SOSY
PREFILLED_SYRINGE | INTRAVENOUS | Status: DC | PRN
  Administered 2020-10-03: 120 mg via INTRAVENOUS

## 2020-10-03 MED ORDER — BISACODYL 5 MG PO TBEC
10.0000 mg | DELAYED_RELEASE_TABLET | Freq: Every day | ORAL | Status: DC
  Administered 2020-10-04 – 2020-10-05 (×2): 10 mg via ORAL
  Filled 2020-10-03 (×3): qty 2

## 2020-10-03 MED ORDER — ASPIRIN 81 MG PO CHEW: 324.0000 mg | CHEWABLE_TABLET | Freq: Every day | ORAL | Status: DC

## 2020-10-03 MED ORDER — MORPHINE SULFATE (PF) 2 MG/ML IV SOLN
1.0000 mg | INTRAVENOUS | Status: DC | PRN
  Administered 2020-10-03 (×2): 2 mg via INTRAVENOUS
  Administered 2020-10-03: 4 mg via INTRAVENOUS
  Administered 2020-10-03: 2 mg via INTRAVENOUS
  Administered 2020-10-04: 4 mg via INTRAVENOUS
  Administered 2020-10-04: 2 mg via INTRAVENOUS
  Filled 2020-10-03: qty 1
  Filled 2020-10-03: qty 2
  Filled 2020-10-03 (×2): qty 1
  Filled 2020-10-03: qty 2
  Filled 2020-10-03: qty 1

## 2020-10-03 MED ORDER — ACETAMINOPHEN 500 MG PO TABS
1000.0000 mg | ORAL_TABLET | Freq: Four times a day (QID) | ORAL | Status: DC
  Administered 2020-10-03 – 2020-10-07 (×13): 1000 mg via ORAL
  Filled 2020-10-03 (×13): qty 2

## 2020-10-03 MED ORDER — PHENYLEPHRINE 40 MCG/ML (10ML) SYRINGE FOR IV PUSH (FOR BLOOD PRESSURE SUPPORT)
PREFILLED_SYRINGE | INTRAVENOUS | Status: DC | PRN
  Administered 2020-10-03 (×5): 80 ug via INTRAVENOUS

## 2020-10-03 MED ORDER — EPINEPHRINE HCL 5 MG/250ML IV SOLN IN NS: 0.0000 ug/min | INTRAVENOUS | Status: DC

## 2020-10-03 MED ORDER — PHENYLEPHRINE HCL-NACL 20-0.9 MG/250ML-% IV SOLN
0.0000 ug/min | INTRAVENOUS | Status: DC
  Administered 2020-10-03: 30 ug/min via INTRAVENOUS
  Filled 2020-10-03: qty 250

## 2020-10-03 MED ORDER — SODIUM CHLORIDE 0.9 % IV SOLN: INTRAVENOUS | Status: DC

## 2020-10-03 MED ORDER — VANCOMYCIN HCL IN DEXTROSE 1-5 GM/200ML-% IV SOLN
1000.0000 mg | Freq: Once | INTRAVENOUS | Status: AC
  Administered 2020-10-03: 1000 mg via INTRAVENOUS
  Filled 2020-10-03: qty 200

## 2020-10-03 MED ORDER — ROCURONIUM BROMIDE 10 MG/ML (PF) SYRINGE
PREFILLED_SYRINGE | INTRAVENOUS | Status: AC
  Filled 2020-10-03: qty 20

## 2020-10-03 MED ORDER — LACTATED RINGERS IV SOLN: INTRAVENOUS | Status: DC

## 2020-10-03 MED ORDER — ROCURONIUM BROMIDE 10 MG/ML (PF) SYRINGE
PREFILLED_SYRINGE | INTRAVENOUS | Status: DC | PRN
  Administered 2020-10-03 (×2): 50 mg via INTRAVENOUS
  Administered 2020-10-03: 100 mg via INTRAVENOUS

## 2020-10-03 MED ORDER — NITROGLYCERIN IN D5W 200-5 MCG/ML-% IV SOLN: 0.0000 ug/min | INTRAVENOUS | Status: DC

## 2020-10-03 MED ORDER — METOPROLOL TARTRATE 12.5 MG HALF TABLET
12.5000 mg | ORAL_TABLET | Freq: Two times a day (BID) | ORAL | Status: DC
  Administered 2020-10-03 – 2020-10-07 (×8): 12.5 mg via ORAL
  Filled 2020-10-03 (×8): qty 1

## 2020-10-03 MED ORDER — MIDAZOLAM HCL 2 MG/2ML IJ SOLN
2.0000 mg | INTRAMUSCULAR | Status: DC | PRN
  Filled 2020-10-03: qty 2

## 2020-10-03 MED ORDER — SODIUM BICARBONATE 8.4 % IV SOLN
50.0000 meq | INTRAVENOUS | Status: AC
  Administered 2020-10-03: 50 meq via INTRAVENOUS

## 2020-10-03 MED ORDER — PHENYLEPHRINE 40 MCG/ML (10ML) SYRINGE FOR IV PUSH (FOR BLOOD PRESSURE SUPPORT)
PREFILLED_SYRINGE | INTRAVENOUS | Status: AC
  Filled 2020-10-03: qty 10

## 2020-10-03 MED ORDER — MILRINONE LACTATE IN DEXTROSE 20-5 MG/100ML-% IV SOLN
0.1250 ug/kg/min | INTRAVENOUS | Status: DC
  Administered 2020-10-03: 0.125 ug/kg/min via INTRAVENOUS
  Filled 2020-10-03: qty 100

## 2020-10-03 MED ORDER — ALBUMIN HUMAN 5 % IV SOLN: 250.0000 mL | INTRAVENOUS | Status: AC | PRN

## 2020-10-03 MED ORDER — SODIUM CHLORIDE (PF) 0.9 % IJ SOLN
OROMUCOSAL | Status: DC | PRN
  Administered 2020-10-03 (×3): 4 mL via TOPICAL

## 2020-10-03 MED ORDER — LACTATED RINGERS IV SOLN: INTRAVENOUS | Status: DC | PRN

## 2020-10-03 MED ORDER — PLASMA-LYTE A IV SOLN
INTRAVENOUS | Status: DC | PRN
  Administered 2020-10-03: 1000 mL

## 2020-10-03 MED ORDER — LACTATED RINGERS IV SOLN: 500.0000 mL | Freq: Once | INTRAVENOUS | Status: DC | PRN

## 2020-10-03 MED ORDER — ALBUTEROL SULFATE (2.5 MG/3ML) 0.083% IN NEBU
2.5000 mg | INHALATION_SOLUTION | Freq: Four times a day (QID) | RESPIRATORY_TRACT | Status: DC | PRN
  Administered 2020-10-03 (×2): 2.5 mg via RESPIRATORY_TRACT
  Filled 2020-10-03 (×2): qty 3

## 2020-10-03 MED ORDER — FENTANYL CITRATE (PF) 250 MCG/5ML IJ SOLN
INTRAMUSCULAR | Status: AC
  Filled 2020-10-03: qty 25

## 2020-10-03 MED ORDER — DEXTROSE 50 % IV SOLN: 0.0000 mL | INTRAVENOUS | Status: DC | PRN

## 2020-10-03 MED ORDER — DOCUSATE SODIUM 100 MG PO CAPS
200.0000 mg | ORAL_CAPSULE | Freq: Every day | ORAL | Status: DC
  Administered 2020-10-04 – 2020-10-07 (×4): 200 mg via ORAL
  Filled 2020-10-03 (×4): qty 2

## 2020-10-03 MED ORDER — METOPROLOL TARTRATE 25 MG/10 ML ORAL SUSPENSION
12.5000 mg | Freq: Two times a day (BID) | ORAL | Status: DC
  Filled 2020-10-03 (×4): qty 5

## 2020-10-03 MED ORDER — FENTANYL CITRATE (PF) 250 MCG/5ML IJ SOLN
INTRAMUSCULAR | Status: DC | PRN
  Administered 2020-10-03: 200 ug via INTRAVENOUS
  Administered 2020-10-03: 50 ug via INTRAVENOUS

## 2020-10-03 MED ORDER — DIPHENHYDRAMINE HCL 50 MG/ML IJ SOLN
INTRAMUSCULAR | Status: AC
  Filled 2020-10-03: qty 1

## 2020-10-03 MED ORDER — INSULIN REGULAR(HUMAN) IN NACL 100-0.9 UT/100ML-% IV SOLN
INTRAVENOUS | Status: DC
  Administered 2020-10-03: 2.6 [IU]/h via INTRAVENOUS
  Filled 2020-10-03: qty 100

## 2020-10-03 MED ORDER — FAMOTIDINE IN NACL 20-0.9 MG/50ML-% IV SOLN
20.0000 mg | Freq: Two times a day (BID) | INTRAVENOUS | Status: AC
  Administered 2020-10-03 (×2): 20 mg via INTRAVENOUS
  Filled 2020-10-03 (×2): qty 50

## 2020-10-03 MED ORDER — ATROPINE SULFATE 0.4 MG/ML IV SOSY
PREFILLED_SYRINGE | INTRAVENOUS | Status: DC | PRN
  Administered 2020-10-03: .8 mg via INTRAVENOUS

## 2020-10-03 MED ORDER — 0.9 % SODIUM CHLORIDE (POUR BTL) OPTIME
TOPICAL | Status: DC | PRN
  Administered 2020-10-03: 5000 mL

## 2020-10-03 MED ORDER — MIDAZOLAM HCL (PF) 5 MG/ML IJ SOLN
INTRAMUSCULAR | Status: DC | PRN
  Administered 2020-10-03: 3 mg via INTRAVENOUS
  Administered 2020-10-03 (×3): 1 mg via INTRAVENOUS

## 2020-10-03 MED ORDER — PROTAMINE SULFATE 10 MG/ML IV SOLN
INTRAVENOUS | Status: DC | PRN
  Administered 2020-10-03: 400 mg via INTRAVENOUS

## 2020-10-03 MED ORDER — OXYCODONE HCL 5 MG PO TABS
5.0000 mg | ORAL_TABLET | ORAL | Status: DC | PRN
  Administered 2020-10-04: 5 mg via ORAL
  Administered 2020-10-04 (×2): 10 mg via ORAL
  Administered 2020-10-05: 5 mg via ORAL
  Administered 2020-10-05: 10 mg via ORAL
  Administered 2020-10-06 (×2): 5 mg via ORAL
  Administered 2020-10-06: 10 mg via ORAL
  Filled 2020-10-03 (×3): qty 2
  Filled 2020-10-03: qty 1
  Filled 2020-10-03: qty 2
  Filled 2020-10-03 (×2): qty 1
  Filled 2020-10-03: qty 2
  Filled 2020-10-03: qty 1

## 2020-10-03 MED ORDER — POTASSIUM CHLORIDE 10 MEQ/50ML IV SOLN: 10.0000 meq | INTRAVENOUS | Status: AC

## 2020-10-03 MED ORDER — SODIUM CHLORIDE 0.9% FLUSH
3.0000 mL | Freq: Two times a day (BID) | INTRAVENOUS | Status: DC
  Administered 2020-10-04 – 2020-10-05 (×2): 3 mL via INTRAVENOUS

## 2020-10-03 MED ORDER — METOPROLOL TARTRATE 5 MG/5ML IV SOLN: 2.5000 mg | INTRAVENOUS | Status: DC | PRN

## 2020-10-03 MED ORDER — ALBUMIN HUMAN 5 % IV SOLN: INTRAVENOUS | Status: DC | PRN

## 2020-10-03 MED ORDER — ACETAMINOPHEN 160 MG/5ML PO SOLN: 650.0000 mg | Freq: Once | ORAL | Status: AC

## 2020-10-03 MED ORDER — DEXAMETHASONE SODIUM PHOSPHATE 10 MG/ML IJ SOLN
INTRAMUSCULAR | Status: AC
  Filled 2020-10-03: qty 1

## 2020-10-03 MED ORDER — MIDAZOLAM HCL (PF) 10 MG/2ML IJ SOLN
INTRAMUSCULAR | Status: AC
  Filled 2020-10-03: qty 2

## 2020-10-03 MED FILL — Medication: Qty: 1 | Status: AC

## 2020-10-03 SURGICAL SUPPLY — 87 items
ADAPTER CARDIO PERF ANTE/RETRO (ADAPTER) ×1 IMPLANT
BAG DECANTER FOR FLEXI CONT (MISCELLANEOUS) ×2 IMPLANT
BLADE CLIPPER SURG (BLADE) ×2 IMPLANT
BLADE STERNUM SYSTEM 6 (BLADE) ×2 IMPLANT
BLADE SURG 11 STRL SS (BLADE) ×1 IMPLANT
BNDG ELASTIC 4X5.8 VLCR STR LF (GAUZE/BANDAGES/DRESSINGS) ×2 IMPLANT
BNDG ELASTIC 6X5.8 VLCR STR LF (GAUZE/BANDAGES/DRESSINGS) ×2 IMPLANT
BNDG GAUZE ELAST 4 BULKY (GAUZE/BANDAGES/DRESSINGS) ×2 IMPLANT
CANISTER SUCT 3000ML PPV (MISCELLANEOUS) ×2 IMPLANT
CANNULA EZ GLIDE AORTIC 21FR (CANNULA) ×2 IMPLANT
CANNULA GUNDRY RCSP 15FR (MISCELLANEOUS) ×1 IMPLANT
CATH COUDE FOLEY 5CC 14FR (CATHETERS) ×1 IMPLANT
CATH CPB KIT HENDRICKSON (MISCELLANEOUS) ×2 IMPLANT
CATH ROBINSON RED A/P 18FR (CATHETERS) ×2 IMPLANT
CATH THORACIC 36FR (CATHETERS) ×2 IMPLANT
CATH THORACIC 36FR RT ANG (CATHETERS) ×2 IMPLANT
CLIP VESOCCLUDE MED 24/CT (CLIP) IMPLANT
CLIP VESOCCLUDE SM WIDE 24/CT (CLIP) ×2 IMPLANT
CONTAINER PROTECT SURGISLUSH (MISCELLANEOUS) ×3 IMPLANT
DERMABOND ADVANCED (GAUZE/BANDAGES/DRESSINGS) ×1
DERMABOND ADVANCED .7 DNX12 (GAUZE/BANDAGES/DRESSINGS) IMPLANT
DRAPE CARDIOVASCULAR INCISE (DRAPES) ×1
DRAPE SRG 135X102X78XABS (DRAPES) ×1 IMPLANT
DRAPE WARM FLUID 44X44 (DRAPES) ×2 IMPLANT
DRSG COVADERM 4X14 (GAUZE/BANDAGES/DRESSINGS) ×2 IMPLANT
ELECT REM PT RETURN 9FT ADLT (ELECTROSURGICAL) ×4
ELECTRODE REM PT RTRN 9FT ADLT (ELECTROSURGICAL) ×2 IMPLANT
FELT TEFLON 1X6 (MISCELLANEOUS) ×3 IMPLANT
GAUZE SPONGE 4X4 12PLY STRL (GAUZE/BANDAGES/DRESSINGS) ×4 IMPLANT
GAUZE SPONGE 4X4 12PLY STRL LF (GAUZE/BANDAGES/DRESSINGS) ×2 IMPLANT
GLOVE SURG POLYISO LF SZ7.5 (GLOVE) ×2 IMPLANT
GLOVE SURG SIGNA 7.5 PF LTX (GLOVE) ×6 IMPLANT
GOWN STRL REUS W/ TWL LRG LVL3 (GOWN DISPOSABLE) ×4 IMPLANT
GOWN STRL REUS W/ TWL XL LVL3 (GOWN DISPOSABLE) ×2 IMPLANT
GOWN STRL REUS W/TWL LRG LVL3 (GOWN DISPOSABLE) ×6
GOWN STRL REUS W/TWL XL LVL3 (GOWN DISPOSABLE) ×5
HEMOSTAT POWDER SURGIFOAM 1G (HEMOSTASIS) ×7 IMPLANT
HEMOSTAT SURGICEL 2X14 (HEMOSTASIS) ×2 IMPLANT
INSERT FOGARTY XLG (MISCELLANEOUS) ×1 IMPLANT
KIT BASIN OR (CUSTOM PROCEDURE TRAY) ×2 IMPLANT
KIT SUCTION CATH 14FR (SUCTIONS) ×4 IMPLANT
KIT TURNOVER KIT B (KITS) ×2 IMPLANT
KIT VASOVIEW HEMOPRO 2 VH 4000 (KITS) ×2 IMPLANT
MARKER GRAFT CORONARY BYPASS (MISCELLANEOUS) ×6 IMPLANT
NS IRRIG 1000ML POUR BTL (IV SOLUTION) ×10 IMPLANT
PACK E OPEN HEART (SUTURE) ×2 IMPLANT
PACK OPEN HEART (CUSTOM PROCEDURE TRAY) ×2 IMPLANT
PAD ARMBOARD 7.5X6 YLW CONV (MISCELLANEOUS) ×4 IMPLANT
PAD ELECT DEFIB RADIOL ZOLL (MISCELLANEOUS) ×2 IMPLANT
PENCIL BUTTON HOLSTER BLD 10FT (ELECTRODE) ×2 IMPLANT
POSITIONER HEAD DONUT 9IN (MISCELLANEOUS) ×2 IMPLANT
PUNCH AORTIC ROTATE 4.0MM (MISCELLANEOUS) IMPLANT
PUNCH AORTIC ROTATE 4.5MM 8IN (MISCELLANEOUS) ×1 IMPLANT
PUNCH AORTIC ROTATE 5MM 8IN (MISCELLANEOUS) IMPLANT
SET MPS 3-ND DEL (MISCELLANEOUS) ×1 IMPLANT
SPONGE LAP 4X18 RFD (DISPOSABLE) ×1 IMPLANT
SUPPORT HEART JANKE-BARRON (MISCELLANEOUS) ×2 IMPLANT
SUT BONE WAX W31G (SUTURE) ×2 IMPLANT
SUT MNCRL AB 4-0 PS2 18 (SUTURE) ×1 IMPLANT
SUT PROLENE 3 0 SH DA (SUTURE) ×2 IMPLANT
SUT PROLENE 4 0 RB 1 (SUTURE) ×3
SUT PROLENE 4 0 SH DA (SUTURE) IMPLANT
SUT PROLENE 4-0 RB1 .5 CRCL 36 (SUTURE) IMPLANT
SUT PROLENE 6 0 C 1 30 (SUTURE) ×5 IMPLANT
SUT PROLENE 7 0 BV1 MDA (SUTURE) ×2 IMPLANT
SUT PROLENE 8 0 BV175 6 (SUTURE) IMPLANT
SUT STEEL 6MS V (SUTURE) ×2 IMPLANT
SUT STEEL STERNAL CCS#1 18IN (SUTURE) IMPLANT
SUT STEEL SZ 6 DBL 3X14 BALL (SUTURE) ×2 IMPLANT
SUT VIC AB 1 CTX 36 (SUTURE) ×2
SUT VIC AB 1 CTX36XBRD ANBCTR (SUTURE) ×2 IMPLANT
SUT VIC AB 2-0 CT1 27 (SUTURE) ×1
SUT VIC AB 2-0 CT1 TAPERPNT 27 (SUTURE) IMPLANT
SUT VIC AB 2-0 CTX 27 (SUTURE) IMPLANT
SUT VIC AB 3-0 SH 27 (SUTURE)
SUT VIC AB 3-0 SH 27X BRD (SUTURE) IMPLANT
SUT VIC AB 3-0 X1 27 (SUTURE) IMPLANT
SUT VICRYL 4-0 PS2 18IN ABS (SUTURE) IMPLANT
SYSTEM SAHARA CHEST DRAIN ATS (WOUND CARE) ×2 IMPLANT
TAPE CLOTH SURG 4X10 WHT LF (GAUZE/BANDAGES/DRESSINGS) ×2 IMPLANT
TAPE PAPER 2X10 WHT MICROPORE (GAUZE/BANDAGES/DRESSINGS) ×1 IMPLANT
TOWEL GREEN STERILE (TOWEL DISPOSABLE) ×2 IMPLANT
TOWEL GREEN STERILE FF (TOWEL DISPOSABLE) ×2 IMPLANT
TRAY FOLEY SLVR 16FR TEMP STAT (SET/KITS/TRAYS/PACK) ×2 IMPLANT
TUBING LAP HI FLOW INSUFFLATIO (TUBING) ×2 IMPLANT
UNDERPAD 30X36 HEAVY ABSORB (UNDERPADS AND DIAPERS) ×2 IMPLANT
WATER STERILE IRR 1000ML POUR (IV SOLUTION) ×4 IMPLANT

## 2020-10-03 NOTE — Procedures (Signed)
Extubation Procedure Note  Patient Details:   Name: Keith Franklin DOB: 1960-12-25 MRN: [id number]   Airway Documentation:    Vent end date: 10/03/20 Vent end time: 1545   Evaluation  O2 sats: stable throughout Complications: No apparent complications Patient did tolerate procedure well. Bilateral Breath Sounds: Clear,Diminished   Yes, pt could speak post extubation  Earney Navy 10/03/2020, 3:57 PM

## 2020-10-03 NOTE — Anesthesia Procedure Notes (Signed)
Central Venous Catheter Insertion Performed by: Murvin Natal, MD, anesthesiologist Start/End6/09/2020 7:00 AM, 10/03/2020 7:15 AM Patient location: Pre-op. Preanesthetic checklist: patient identified, IV checked, site marked, risks and benefits discussed, surgical consent, monitors and equipment checked, pre-op evaluation, timeout performed and anesthesia consent Position: Trendelenburg Lidocaine 1% used for infiltration and patient sedated Hand hygiene performed , maximum sterile barriers used  and Seldinger technique used Catheter size: 9 Fr Total catheter length 12. PA cath was placed.MAC introducer Swan type:thermodilution PA Cath depth:45 Procedure performed using ultrasound guided technique. Ultrasound Notes:anatomy identified, needle tip was noted to be adjacent to the nerve/plexus identified and no ultrasound evidence of intravascular and/or intraneural injection Attempts: 1 Following insertion, Biopatch, dressing applied and line sutured. Post procedure assessment: blood return through all ports, free fluid flow and no air  Patient tolerated the procedure well with no immediate complications.

## 2020-10-03 NOTE — Op Note (Signed)
[name], [name] MEDICAL RECORD NO: [id number] ACCOUNT NO: [id number] DATE OF BIRTH: [birth year] FACILITY: [facility] LOCATION: [facility] PHYSICIAN: Revonda Standard. Roxan Hockey, MD  Operative Report   DATE OF PROCEDURE: 10/03/2020  PREOPERATIVE DIAGNOSIS:  Severe two-vessel coronary artery disease, status post ST elevation myocardial infarction.  POSTOPERATIVE DIAGNOSIS:  Severe two-vessel coronary artery disease, status post ST elevation myocardial infarction.  PROCEDURES PERFORMED:   Median sternotomy, extracorporeal circulation, Coronary artery bypass grafting x 3 Left internal mammary artery to LAD, Saphenous vein graft to first diagonal, Saphenous vein graft to the obtuse marginal 3 Endoscopic vein harvest right leg.  SURGEON:  Modesto Charon, MD  ASSISTANT:  Enid Cutter, PA  ANESTHESIA:  General.  FINDINGS:  Transesophageal echocardiography revealed overall no significant valvular pathology.  Overall, preserved left ventricular function by echocardiogram.  Good quality conduits.  Good quality targets.  CLINICAL NOTE: [name] is a 60 year old gentleman who presented with chest pain and ruled in for an ST elevation MI.  He was taken emergently to the cardiac catheterization laboratory where his circumflex was found to be totally occluded.  Dr. Peter  Martinique was able to reestablish flow through the circumflex, but he had residual severe LAD and diagonal disease and moderate residual circumflex disease.  He was advised to undergo coronary artery bypass grafting.  The indications, risks, benefits, and  alternatives were discussed in detail with the patient.  He understood and accepted the risks and agreed to proceed.  DESCRIPTION OF PROCEDURE: [name] was brought to the preoperative holding area on 10/03/2020.  While lines were being placed and he was given sedation, he had a bradycardic event with a pulseless electrical activity cardiac arrest.  CPR was  initiated.   Epinephrine and atropine were given.  CPR was performed for less than 2 minutes.  The patient had return of spontaneous circulation.  He was taken emergently to the operating room.  He was intubated during the course of his resuscitation.  On  arrival to the OR, he was placed on the OR bed.  The EKG showed sinus tachycardia and his blood pressure was stable.  A Foley catheter placement was attempted by nursing staff, but there was resistance.  A 14-French coude catheter passed easily with  return of clear urine.  Intravenous antibiotics were administered.  Anesthesia performed transesophageal echocardiography. Please see Dr. Wallace Going separately dictated note for full details.  The chest, abdomen and legs were prepped and draped in the  usual sterile fashion.  A timeout was performed.  A median sternotomy was performed.  The left internal mammary artery was harvested using standard technique.  Simultaneously, incision was made in the medial aspect of the right leg and the greater saphenous vein was harvested  endoscopically.  The patient did remain hemodynamically stable with no significant EKG changes on the monitor during the vessel harvest.  2000 units of heparin was administered during the vessel harvest.  The remainder of the full heparin dose was given prior to  opening the pericardium.  After harvesting the conduits, the pericardium was opened.  There was massive cardiomegaly.  The aorta was approximately 4 cm in diameter, but without any significant atherosclerotic disease that was evident.  The aorta was cannulated via concentric 2-0  Ethibond pledgeted pursestring sutures.  Adequate anticoagulation was confirmed with ACT measurement prior to cannulating the aorta.  The dual stage venous cannula was placed via pursestring suture in the right atrial appendage.  Cardiopulmonary bypass  was initiated.  Flows were maintained per protocol.  The coronary arteries were inspected and anastomotic sites  were chosen.  The conduits were inspected and cut to length.  A foam pad was placed in the pericardium to insulate the heart.  A  temperature probe was placed in the myocardial septum and a cardioplegia cannula was placed in the ascending aorta.  The aorta was cross clamped.  The left ventricle was emptied via the aortic root vent.  Cardiac arrest then was achieved with cold antegrade blood cardioplegia and topical iced saline.  There was a rapid diastolic arrest, but slow septal cooling.  A  retrograde catheter was placed after giving a liter of cardioplegia antegrade and an additional 750 mL of cardioplegia was administered via the retrograde cannula.  There was septal cooling to 11 degrees Celsius.  A reversed saphenous vein graft was placed end-to-side to the OM3 branch.  This was the larger of the 2 bifurcating vessels from the large dominant lateral OM.  There was disease in the OM proximal to the bifurcation.  A probe passed easily distally.  An  end-to-side anastomosis was performed with a running 7-0 Prolene suture.  Probe passed easily proximally and distally.  Cardioplegia was administered and there was good flow and good hemostasis.  Next, a reversed saphenous vein graft was placed end-to-side to the first diagonal branch of the LAD.  This was a 1.5 mm good quality target.  The vein was anastomosed end-to-side with a running 7-0 Prolene suture.  Again, a probe passed easily proximally  and distally and there was good flow and good hemostasis with cardioplegia administration.  Additional cardioplegia was administered retrograde. The left internal mammary artery was brought through a window in the pericardium.  The distal end was bevelled.  It was anastomosed end-to-side to the distal LAD.  The LAD was a 2 mm target vessel.  It  did have diffuse disease, but a probe passed proximally and distally from the anastomosis.  The mammary artery was a 1.5 mm good quality vessel, it was anastomosed  end-to-side with a running 8-0 Prolene suture.  At the completion of the anastomosis, the  bulldog clamp was briefly removed and septal rewarming was noted.  The bulldog clamp was replaced.  The mammary pedicle was tacked to the epicardial surface of the heart with 6-0 Prolene sutures.  Additional cardioplegia was administered.  The vein grafts were cut to length.  The proximal vein graft anastomoses were performed to 4.5 mm punch aortotomies with running 6-0 Prolene sutures.  At the completion of the final proximal anastomosis, the  patient was placed in Trendelenburg position.  Lidocaine was administered.  The aortic root was de-aired and the aortic crossclamp was removed.  The total crossclamp time was 63 minutes.  The patient spontaneously resumed a bradycardic rhythm.  While rewarming was completed, all proximal and distal anastomoses were inspected for hemostasis.  Epicardial pacing wires were placed on the right ventricle and right atrium and atrial pacing was  initiated at 80 beats per minute.  When the patient had rewarmed to a core temperature of 37 degrees Celsius, he was weaned from cardiopulmonary bypass on the first attempt.  He was initially weaned without pressor support, but there was global  hypokinesis noted and his cardiac index initially was less than 1.5 liters per minute per meter squared.  He was started on milrinone and epinephrine infusions at low dose of 0.25 for the milrinone and 3 initially and then 5 mcg for the epinephrine.  His  cardiac index and  echocardiogram images improved with those measures.  A test dose of protamine was administered and was well tolerated.  The atrial and aortic cannula were removed.  The remainder of the protamine was administered without incident.  Chest was irrigated with warm saline.  Hemostasis was achieved.   Pericardium was reapproximated over the aortic root with interrupted 3-0 silk sutures.  Left pleural and mediastinal chest tubes were  placed through separate subcostal incisions.  The sternum was closed with a combination of single and heavy gauge  stainless steel wires.  The pectoralis fascia, subcutaneous tissue and skin were closed in standard fashion.  All sponge, needle and instrument counts were correct at the end of the procedure.  The patient was taken from the operating room to the  surgical intensive care unit, intubated and in fair condition.   SHW D: 10/03/2020 5:46:24 pm T: 10/03/2020 10:43:00 pm  JOB: [id number]/ [id number]

## 2020-10-03 NOTE — Anesthesia Preprocedure Evaluation (Addendum)
Anesthesia Evaluation  Patient identified by MRN, date of birth, ID band Patient awake    Reviewed: Allergy & Precautions, NPO status , Patient's Chart, lab work & pertinent test results  Airway Mallampati: III  TM Distance: >3 FB Neck ROM: Full    Dental no notable dental hx.    Pulmonary neg pulmonary ROS,    Pulmonary exam normal breath sounds clear to auscultation       Cardiovascular + CAD and + Past MI  Normal cardiovascular exam Rhythm:Regular Rate:Normal  ECG: Normal sinus rhythm, rate 75 Left axis deviation  ECHO: 1. Left ventricular ejection fraction, by estimation, is 45 to 50%. The left ventricle has mildly decreased function. The left ventricle has no regional wall motion abnormalities. There is mild concentric left ventricular hypertrophy. Left ventricular diastolic parameters are consistent with Grade I diastolic dysfunction (impaired relaxation). There is moderate hypokinesis of the left ventricular, mid-apical anterior wall. There is moderate hypokinesis of the left ventricular, entire inferolateral wall. 2. Right ventricular systolic function is normal. The right ventricular size is normal. 3. Left atrial size was mildly dilated. 4. The mitral valve is normal in structure. No evidence of mitral valve regurgitation. No evidence of mitral stenosis. 5. The aortic valve is tricuspid. Aortic valve regurgitation is not visualized. No aortic stenosis is present. 6. There is mild dilatation of the ascending aorta, measuring 42 mm. 7. The inferior vena cava is normal in size with greater than 50% respiratory variability, suggesting right atrial pressure of 3 mmHg.   Neuro/Psych negative neurological ROS  negative psych ROS   GI/Hepatic Neg liver ROS, GERD  Medicated and Controlled,  Endo/Other  negative endocrine ROS  Renal/GU negative Renal ROS     Musculoskeletal negative musculoskeletal ROS (+)    Abdominal (+) + obese,   Peds  Hematology negative hematology ROS (+)   Anesthesia Other Findings Coronary artery disease  Reproductive/Obstetrics                            Anesthesia Physical Anesthesia Plan  ASA: IV  Anesthesia Plan: General   Post-op Pain Management:    Induction: Intravenous  PONV Risk Score and Plan: 2 and Ondansetron, Dexamethasone, Midazolam and Treatment may vary due to age or medical condition  Airway Management Planned: Oral ETT  Additional Equipment: Arterial line, CVP, PA Cath, TEE and Ultrasound Guidance Line Placement  Intra-op Plan:   Post-operative Plan: Post-operative intubation/ventilation  Informed Consent: I have reviewed the patients History and Physical, chart, labs and discussed the procedure including the risks, benefits and alternatives for the proposed anesthesia with the patient or authorized representative who has indicated his/her understanding and acceptance.     Dental advisory given  Plan Discussed with: CRNA  Anesthesia Plan Comments:        Anesthesia Quick Evaluation

## 2020-10-03 NOTE — Interval H&P Note (Signed)
History and Physical Interval Note:  10/03/2020 7:18 AM  Keith Franklin  has presented today for surgery, with the diagnosis of coronary artery disease.  The various methods of treatment have been discussed with the patient and family. After consideration of risks, benefits and other options for treatment, the patient has consented to  Procedure(s) with comments: CORONARY ARTERY BYPASS GRAFTING (CABG) (N/A) - will harvest left radial artery for conduit Swan as a surgical intervention.  The patient's history has been reviewed, patient examined, no change in status, stable for surgery.  I have reviewed the patient's chart and labs.  Questions were answered to the patient's satisfaction.     Melrose Nakayama

## 2020-10-03 NOTE — Progress Notes (Signed)
Recruitment maneuvers performed.  o2 saturation improved to 99%

## 2020-10-03 NOTE — Brief Op Note (Addendum)
09/30/2020 - 10/03/2020  5:32 PM  PATIENT:  Keith Franklin  60 y.o. male  PRE-OPERATIVE DIAGNOSIS:  CORONARY ARTERY DISEASE  POST-OPERATIVE DIAGNOSIS:  CORONARY ARTERY DISEASE  PROCEDURE:   CORONARY ARTERY BYPASS GRAFTING (CABG) X THREE ON PUMP USING LEFT INTERNAL MAMMARY ARTERY AND RIGHT GREATER SAPHENOUS ENDOSCOPIC VEIN HARVEST CONDUITS   LIMA-LAD  SVG-D1  SVG-OM3  Vein harvest time: 30 min  Vein prop time 20 min  SURGEON:   Melrose Nakayama, MD - Primary  PHYSICIAN ASSISTANT: Enid Cutter, PA  ASSISTANTS: M. Talbert Nan, RNFA  ANESTHESIA:   general  EBL:  600 ml  BLOOD ADMINISTERED:none  DRAINS: Left pleural and mediastinal tubes   LOCAL MEDICATIONS USED:  NONE  SPECIMEN:  No Specimen  DISPOSITION OF SPECIMEN:  N/A  COUNTS:  YES  DICTATION: .Dragon Dictation  PLAN OF CARE: Admit to inpatient   PATIENT DISPOSITION:  ICU - intubated and hemodynamically stable.   Delay start of Pharmacological VTE agent (>24hrs) due to surgical blood loss or risk of bleeding: yes

## 2020-10-03 NOTE — Progress Notes (Signed)
      [address]       [place] [address]             [phone]      S/p CABG x 3  Extubated, alert and no distress but tachypneic  BP 102/65   Pulse 69   Temp (!) 97.52 F (36.4 C)   Resp (!) 21   Ht 5\' 8"  (1.727 m)   Wt 115.6 kg   SpO2 97%   BMI 38.75 kg/m  37/17 CI= 3.8 Milrinone 0.25, epi @ 6  Intake/Output Summary (Last 24 hours) at 10/03/2020 1734 Last data filed at 10/03/2020 1600 Gross per 24 hour  Intake 3921.98 ml  Output 3343 ml  Net 578.98 ml  minimal CT output  Tachypnea- suspect mostly pain related- will give pain meds Repeat CXR to r/o underlying cause Solumedrol IV in case there is some airway swelling as he did have an emergent intubation this AM  Remo Lipps C. Roxan Hockey, MD [facility] [phone]

## 2020-10-03 NOTE — Anesthesia Procedure Notes (Signed)
Procedure Name: Intubation Date/Time: 10/03/2020 7:55 AM Performed by: Trinna Post., CRNA Pre-anesthesia Checklist: Patient identified, Emergency Drugs available, Suction available, Patient being monitored and Timeout performed Patient Re-evaluated:Patient Re-evaluated prior to induction Oxygen Delivery Method: Ambu bag Preoxygenation: Pre-oxygenation with 100% oxygen Induction Type: IV induction Ventilation: Nasal airway inserted- appropriate to patient size Laryngoscope Size: Glidescope and 4 Grade View: Grade I Tube type: Oral Tube size: 8.0 mm Number of attempts: 1 Airway Equipment and Method: Rigid stylet and Video-laryngoscopy Placement Confirmation: ETT inserted through vocal cords under direct vision,  positive ETCO2 and breath sounds checked- equal and bilateral Secured at: 23 cm Tube secured with: Tape Dental Injury: Teeth and Oropharynx as per pre-operative assessment  Comments: Pt emergently intubated during code blue

## 2020-10-03 NOTE — Anesthesia Procedure Notes (Signed)
Arterial Line Insertion Start/End6/09/2020 7:15 AM, 10/03/2020 7:30 AM Performed by: Murvin Natal, MD, anesthesiologist  Patient location: Pre-op. Preanesthetic checklist: patient identified, IV checked, site marked, risks and benefits discussed, surgical consent, monitors and equipment checked, pre-op evaluation, timeout performed and anesthesia consent Lidocaine 1% used for infiltration and patient sedated Left, radial was placed Catheter size: 20 Fr Hand hygiene performed , maximum sterile barriers used  and Seldinger technique used  Attempts: 1 (Previous unsuccessful attempts by CRNA) Procedure performed using ultrasound guided technique. Ultrasound Notes:anatomy identified, needle tip was noted to be adjacent to the nerve/plexus identified and no ultrasound evidence of intravascular and/or intraneural injection Following insertion, dressing applied and Biopatch. Post procedure assessment: normal and unchanged  Post procedure complications: second provider assisted. Patient tolerated the procedure well with no immediate complications.

## 2020-10-03 NOTE — Plan of Care (Signed)

## 2020-10-03 NOTE — Transfer of Care (Signed)
Immediate Anesthesia Transfer of Care Note  Patient: Keith Franklin  Procedure(s) Performed: CORONARY ARTERY BYPASS GRAFTING (CABG) X THREE, ON PUMP, USING LEFT INTERNAL MAMMARY ARTERY AND RIGHT GREATER SAPHENOUS ENDOSCOPIC VEIN HARVEST CONDUITS (N/A Chest)  Patient Location: PACU and ICU  Anesthesia Type:General  Level of Consciousness: sedated and Patient remains intubated per anesthesia plan  Airway & Oxygen Therapy: Patient remains intubated per anesthesia plan and Patient placed on Ventilator (see vital sign flow sheet for setting)  Post-op Assessment: Report given to RN and Post -op Vital signs reviewed and stable  Post vital signs: Reviewed and stable  Last Vitals:  Vitals Value Taken Time  BP 95/55 10/03/20 1239  Temp 36 C 10/03/20 1241  Pulse 81 10/03/20 1241  Resp 13 10/03/20 1241  SpO2 96 % 10/03/20 1241  Vitals shown include unvalidated device data.  Last Pain:  Vitals:   10/03/20 0400  TempSrc: Oral  PainSc: 0-No pain      Patients Stated Pain Goal: 0 (40/98/28 6751)  Complications: No complications documented.

## 2020-10-03 NOTE — Progress Notes (Signed)
ABG results given to Dr. Roxan Hockey; orders received and carried out.  Results for KENTRELL, HALLAHAN (MRN [id number]) as of 10/03/2020 17:11  Ref. Range 10/03/2020 12:43  Sample type Unknown ARTERIAL  pH, Arterial Latest Ref Range: 7.350 - 7.450  7.171 (LL)  pCO2 arterial Latest Ref Range: 32.0 - 48.0 mmHg 54.8 (H)  pO2, Arterial Latest Ref Range: 83.0 - 108.0 mmHg 78 (L)  TCO2 Latest Ref Range: 22 - 32 mmol/L 22  Acid-base deficit Latest Ref Range: 0.0 - 2.0 mmol/L 9.0 (H)  Bicarbonate Latest Ref Range: 20.0 - 28.0 mmol/L 20.3  O2 Saturation Latest Units: % 92.0  Patient temperature Unknown 36.0 C  Sodium Latest Ref Range: 135 - 145 mmol/L 138  Potassium Latest Ref Range: 3.5 - 5.1 mmol/L 4.1  Calcium Ionized Latest Ref Range: 1.15 - 1.40 mmol/L 1.12 (L)  Hemoglobin Latest Ref Range: 13.0 - 17.0 g/dL 12.6 (L)  HCT Latest Ref Range: 39.0 - 52.0 % 37.0 (L)

## 2020-10-03 NOTE — Interval H&P Note (Signed)
History and Physical Interval Note:   I have seen and examined Mr. Tweed and reviewed the chart and cath images. Presented with a STEMI with severe 2 vessel CAD with severely diseased LAD and totally occluded circumflex.  Dr. Martinique was able to open the circumflex. No further pain but has had some brief runs of VT postcath.  I agree with recommendation for CABG with plan to graft LAD, 1 or 2 diagonals and distal circumflex branch. Post PTCA there is only moderate stenosis in the circumflex, so I am concerned that a radial graft won't stay open due to competitive flow. Therefore will not use radial.  I reviewed the indications, risks, benefits and alternatives with him. He understands risks include but are not limited to death, MI, DVT, PE, bleeding, possible need for transfusion, infection, arrhythmias, as well as other organ system dysfunction, including renal, respiratory or GI complications. He accepts the risks and agrees to proceed.  7:19 AM  [name]  has presented today for surgery, with the diagnosis of coronary artery disease.  The various methods of treatment have been discussed with the patient and family. After consideration of risks, benefits and other options for treatment, the patient has consented to  Procedure(s) with comments: CORONARY ARTERY BYPASS GRAFTING (CABG) (N/A) - will harvest left radial artery for conduit Swan as a surgical intervention.  The patient's history has been reviewed, patient examined, no change in status, stable for surgery.  I have reviewed the patient's chart and labs.  Questions were answered to the patient's satisfaction.     Melrose Nakayama

## 2020-10-03 NOTE — Anesthesia Postprocedure Evaluation (Addendum)
Anesthesia Post Note  Patient: Keith Franklin  Procedure(s) Performed: CORONARY ARTERY BYPASS GRAFTING (CABG) X THREE, ON PUMP, USING LEFT INTERNAL MAMMARY ARTERY AND RIGHT GREATER SAPHENOUS ENDOSCOPIC VEIN HARVEST CONDUITS (N/A Chest)     Patient location during evaluation: ICU Anesthesia Type: General Level of consciousness: awake Pain management: pain level controlled Vital Signs Assessment: post-procedure vital signs reviewed and stable Respiratory status: spontaneous breathing and patient connected to nasal cannula oxygen Cardiovascular status: blood pressure returned to baseline and stable Postop Assessment: no apparent nausea or vomiting Anesthetic complications: no   No complications documented.  Last Vitals:  Vitals:   10/03/20 1730 10/03/20 1745  BP: 119/66 128/70  Pulse: 86 87  Resp: (!) 38 (!) 30  Temp: 37.2 C 37.2 C  SpO2: 94% 93%    Last Pain:  Vitals:   10/03/20 1634  TempSrc:   PainSc: 2                  Chalmers Iddings P Dann Galicia

## 2020-10-04 ENCOUNTER — Inpatient Hospital Stay (HOSPITAL_COMMUNITY): Payer: 59

## 2020-10-04 ENCOUNTER — Encounter (HOSPITAL_COMMUNITY): Payer: Self-pay | Admitting: Thoracic Surgery (Cardiothoracic Vascular Surgery)

## 2020-10-04 LAB — BASIC METABOLIC PANEL
Anion gap: 7 (ref 5–15)
Anion gap: 5 (ref 5–15)
BUN: 23 mg/dL — ABNORMAL HIGH (ref 6–20)
BUN: 27 mg/dL — ABNORMAL HIGH (ref 6–20)
CO2: 25 mmol/L (ref 22–32)
CO2: 27 mmol/L (ref 22–32)
Calcium: 7.8 mg/dL — ABNORMAL LOW (ref 8.9–10.3)
Calcium: 8 mg/dL — ABNORMAL LOW (ref 8.9–10.3)
Chloride: 107 mmol/L (ref 98–111)
Chloride: 106 mmol/L (ref 98–111)
Creatinine, Ser: 1.17 mg/dL (ref 0.61–1.24)
Creatinine, Ser: 1.23 mg/dL (ref 0.61–1.24)
GFR, Estimated: >60 mL/min (ref >60)
GFR, Estimated: >60 mL/min (ref >60)
Glucose, Bld: 115 mg/dL — ABNORMAL HIGH (ref 70–99)
Glucose, Bld: 120 mg/dL — ABNORMAL HIGH (ref 70–99)
Potassium: 4.6 mmol/L (ref 3.5–5.1)
Potassium: 4.5 mmol/L (ref 3.5–5.1)
Sodium: 139 mmol/L (ref 135–145)
Sodium: 138 mmol/L (ref 135–145)

## 2020-10-04 LAB — CBC
HCT: 36.3 % — ABNORMAL LOW (ref 39.0–52.0)
HCT: 36.9 % — ABNORMAL LOW (ref 39.0–52.0)
Hemoglobin: 12.1 g/dL — ABNORMAL LOW (ref 13.0–17.0)
Hemoglobin: 12.2 g/dL — ABNORMAL LOW (ref 13.0–17.0)
MCH: 31.3 pg (ref 26.0–34.0)
MCH: 31.3 pg (ref 26.0–34.0)
MCHC: 33.3 g/dL (ref 30.0–36.0)
MCHC: 33.1 g/dL (ref 30.0–36.0)
MCV: 94 fL (ref 80.0–100.0)
MCV: 94.6 fL (ref 80.0–100.0)
Platelets: 201 10*3/uL (ref 150–400)
Platelets: 217 10*3/uL (ref 150–400)
RBC: 3.86 MIL/uL — ABNORMAL LOW (ref 4.22–5.81)
RBC: 3.9 MIL/uL — ABNORMAL LOW (ref 4.22–5.81)
RDW: 13.3 % (ref 11.5–15.5)
RDW: 13.5 % (ref 11.5–15.5)
WBC: 20.5 10*3/uL — ABNORMAL HIGH (ref 4.0–10.5)
WBC: 23.8 10*3/uL — ABNORMAL HIGH (ref 4.0–10.5)
nRBC: 0 % (ref 0.0–0.2)
nRBC: 0 % (ref 0.0–0.2)

## 2020-10-04 LAB — GLUCOSE, CAPILLARY
Glucose-Capillary: 101 mg/dL — ABNORMAL HIGH (ref 70–99)
Glucose-Capillary: 107 mg/dL — ABNORMAL HIGH (ref 70–99)
Glucose-Capillary: 109 mg/dL — ABNORMAL HIGH (ref 70–99)
Glucose-Capillary: 112 mg/dL — ABNORMAL HIGH (ref 70–99)
Glucose-Capillary: 113 mg/dL — ABNORMAL HIGH (ref 70–99)
Glucose-Capillary: 116 mg/dL — ABNORMAL HIGH (ref 70–99)
Glucose-Capillary: 118 mg/dL — ABNORMAL HIGH (ref 70–99)
Glucose-Capillary: 118 mg/dL — ABNORMAL HIGH (ref 70–99)
Glucose-Capillary: 119 mg/dL — ABNORMAL HIGH (ref 70–99)
Glucose-Capillary: 120 mg/dL — ABNORMAL HIGH (ref 70–99)
Glucose-Capillary: 123 mg/dL — ABNORMAL HIGH (ref 70–99)
Glucose-Capillary: 126 mg/dL — ABNORMAL HIGH (ref 70–99)
Glucose-Capillary: 99 mg/dL (ref 70–99)
Glucose-Capillary: 99 mg/dL (ref 70–99)

## 2020-10-04 LAB — MAGNESIUM
Magnesium: 2.7 mg/dL — ABNORMAL HIGH (ref 1.7–2.4)
Magnesium: 2.6 mg/dL — ABNORMAL HIGH (ref 1.7–2.4)

## 2020-10-04 MED ORDER — POTASSIUM CHLORIDE 10 MEQ/50ML IV SOLN
10.0000 meq | INTRAVENOUS | Status: AC
  Administered 2020-10-04 (×2): 10 meq via INTRAVENOUS
  Filled 2020-10-04 (×2): qty 50

## 2020-10-04 MED ORDER — SIMETHICONE 80 MG PO CHEW: 80.0000 mg | CHEWABLE_TABLET | Freq: Four times a day (QID) | ORAL | Status: DC | PRN

## 2020-10-04 MED ORDER — ENOXAPARIN SODIUM 40 MG/0.4ML IJ SOSY
40.0000 mg | PREFILLED_SYRINGE | Freq: Every day | INTRAMUSCULAR | Status: DC
  Administered 2020-10-04 – 2020-10-06 (×3): 40 mg via SUBCUTANEOUS
  Filled 2020-10-04 (×3): qty 0.4

## 2020-10-04 MED ORDER — FUROSEMIDE 10 MG/ML IJ SOLN
40.0000 mg | Freq: Once | INTRAMUSCULAR | Status: AC
  Administered 2020-10-04: 40 mg via INTRAVENOUS
  Filled 2020-10-04: qty 4

## 2020-10-04 MED ORDER — INSULIN DETEMIR 100 UNIT/ML ~~LOC~~ SOLN
30.0000 [IU] | Freq: Two times a day (BID) | SUBCUTANEOUS | Status: DC
  Administered 2020-10-04 (×2): 30 [IU] via SUBCUTANEOUS
  Filled 2020-10-04 (×4): qty 0.3

## 2020-10-04 MED ORDER — INSULIN ASPART 100 UNIT/ML IJ SOLN
0.0000 [IU] | INTRAMUSCULAR | Status: DC
Start: 2020-10-04 — End: 2020-10-05
  Administered 2020-10-04: 2 [IU] via SUBCUTANEOUS
  Administered 2020-10-05: 4 [IU] via SUBCUTANEOUS

## 2020-10-04 MED ORDER — INSULIN ASPART 100 UNIT/ML IJ SOLN: 0.0000 [IU] | INTRAMUSCULAR | Status: DC

## 2020-10-04 MED FILL — Potassium Chloride Inj 2 mEq/ML: INTRAVENOUS | Qty: 40 | Status: AC

## 2020-10-04 MED FILL — Heparin Sodium (Porcine) Inj 1000 Unit/ML: INTRAMUSCULAR | Qty: 30 | Status: AC

## 2020-10-04 MED FILL — Magnesium Sulfate Inj 50%: INTRAMUSCULAR | Qty: 10 | Status: AC

## 2020-10-04 NOTE — Progress Notes (Signed)
[facility] Evening Rounds  POD #1 s/p CABG No complaints No drips BP (!) 142/97   Pulse 88   Temp 98.4 F (36.9 C) (Oral)   Resp (!) 28   Ht 5\' 8"  (1.727 m)   Wt 121.3 kg   SpO2 98%   BMI 40.66 kg/m  Alert/oriented CTA RRR (paced) Mild edema   Intake/Output Summary (Last 24 hours) at 10/04/2020 1757 Last data filed at 10/04/2020 1700 Gross per 24 hour  Intake 2360.92 ml  Output 1765 ml  Net 595.92 ml    A/p: making good progress Continue present management Keith Franklin Z. Orvan Seen, [place]

## 2020-10-04 NOTE — Plan of Care (Signed)

## 2020-10-04 NOTE — Plan of Care (Signed)
  Problem: Education: Goal: Knowledge of General Education information will improve Description: Including pain rating scale, medication(s)/side effects and non-pharmacologic comfort measures Outcome: Progressing   Problem: Health Behavior/Discharge Planning: Goal: Ability to manage health-related needs will improve Outcome: Progressing   Problem: Clinical Measurements: Goal: Ability to maintain clinical measurements within normal limits will improve Outcome: Progressing Goal: Will remain free from infection Outcome: Progressing Goal: Diagnostic test results will improve Outcome: Progressing Goal: Respiratory complications will improve Outcome: Progressing Goal: Cardiovascular complication will be avoided Outcome: Progressing   Problem: Activity: Goal: Risk for activity intolerance will decrease Outcome: Progressing   Problem: Coping: Goal: Level of anxiety will decrease Outcome: Progressing   Problem: Nutrition: Goal: Adequate nutrition will be maintained Outcome: Progressing   Problem: Pain Managment: Goal: General experience of comfort will improve Outcome: Progressing

## 2020-10-04 NOTE — Progress Notes (Signed)
1 Day Post-Op Procedure(s) (LRB): CORONARY ARTERY BYPASS GRAFTING (CABG) X THREE, ON PUMP, USING LEFT INTERNAL MAMMARY ARTERY AND RIGHT GREATER SAPHENOUS ENDOSCOPIC VEIN HARVEST CONDUITS (N/A) Subjective: Co incisional pain  Objective: Vital signs in last 24 hours: Temp:  [96.26 F (35.7 C)-101.66 F (38.7 C)] 99.68 F (37.6 C) (06/07 0715) Pulse Rate:  [35-91] 87 (06/07 0715) Cardiac Rhythm: Normal sinus rhythm (06/07 0430) Resp:  [9-45] 29 (06/07 0715) BP: (65-170)/(19-99) 128/88 (06/07 0715) SpO2:  [81 %-100 %] 94 % (06/07 0715) Arterial Line BP: (34-169)/(26-117) 145/80 (06/07 0715) FiO2 (%):  [36 %-50 %] 50 % (06/07 0000) Weight:  [121.3 kg] 121.3 kg (06/07 0440)  Hemodynamic parameters for last 24 hours: PAP: (27-68)/(7-25) 48/22 CO:  [4.2 L/min-8.8 L/min] 6.1 L/min CI:  [1.9 L/min/m2-3.9 L/min/m2] 2.7 L/min/m2  Intake/Output from previous day: 06/06 0701 - 06/07 0700 In: 3818 [I.V.:3051.1; Blood:402; IV Piggyback:1747.9] Out: 2543 [Urine:1745; Blood:588; Chest Tube:210] Intake/Output this shift: No intake/output data recorded.  General appearance: alert, cooperative and no distress Neurologic: intact Heart: regular rate and rhythm Lungs: diminished breath sounds bibasilar Abdomen: mildly distended, hypoactive BS  Lab Results: Recent Labs    10/03/20 1726 10/04/20 0417  WBC 22.2* 20.5*  HGB 12.2* 12.1*  HCT 37.5* 36.3*  PLT 257 201   BMET:  Recent Labs    10/03/20 1726 10/04/20 0417  NA 141 139  K 3.4* 4.6  CL 106 107  CO2 19* 25  GLUCOSE 240* 115*  BUN 18 23*  CREATININE 1.51* 1.17  CALCIUM 7.4* 7.8*    PT/INR:  Recent Labs    10/03/20 1214  LABPROT 17.2*  INR 1.4*   ABG    Component Value Date/Time   PHART 7.292 (L) 10/03/2020 1643   HCO3 15.8 (L) 10/03/2020 1643   TCO2 17 (L) 10/03/2020 1643   ACIDBASEDEF 10.0 (H) 10/03/2020 1643   O2SAT 91.0 10/03/2020 1643   CBG (last 3)  Recent Labs    10/04/20 0423 10/04/20 0623  10/04/20 0716  GLUCAP 126* 99 119*    Assessment/Plan: S/P Procedure(s) (LRB): CORONARY ARTERY BYPASS GRAFTING (CABG) X THREE, ON PUMP, USING LEFT INTERNAL MAMMARY ARTERY AND RIGHT GREATER SAPHENOUS ENDOSCOPIC VEIN HARVEST CONDUITS (N/A) -NEURO- intact CV-  In SR with good hemodynamics- dc milrinone  ASA, beta blocker, statin, start plavix after pacing wires out RESP- IS for basilar atelectasis RENAL- creatinine normal, lytes OK, diurese for volume overload ENDO_ CBG well controlled with insulin drip  transition to levemir + SSI GI- advance diet as tolerated Leukocytosis- likely SIRS type reaction to CPB Dc chest tubes SCD + enoxaparin for DVT prophylaxis Mobilize  LOS: 4 days    Keith Franklin 10/04/2020

## 2020-10-04 NOTE — Progress Notes (Signed)
Progress Note  Patient Name: Antwane Grose Date of Encounter: 10/04/2020  [facility] Cardiologist: Cyla Haluska Martinique, MD   Subjective   Doing well post op day #1 CABG. Sitting in chair.   Inpatient Medications    Scheduled Meds: . acetaminophen  1,000 mg Oral Q6H   Or  . acetaminophen (TYLENOL) oral liquid 160 mg/5 mL  1,000 mg Per Tube Q6H  . aspirin EC  325 mg Oral Daily   Or  . aspirin  324 mg Per Tube Daily  . atorvastatin  80 mg Oral Daily  . bisacodyl  10 mg Oral Daily   Or  . bisacodyl  10 mg Rectal Daily  . Chlorhexidine Gluconate Cloth  6 each Topical Daily  . docusate sodium  200 mg Oral Daily  . enoxaparin (LOVENOX) injection  40 mg Subcutaneous QHS  . furosemide  40 mg Intravenous Once  . insulin aspart  0-24 Units Subcutaneous Q4H  . insulin detemir  30 Units Subcutaneous BID  . metoprolol tartrate  12.5 mg Oral BID   Or  . metoprolol tartrate  12.5 mg Per Tube BID  . [START ON 10/05/2020] pantoprazole  40 mg Oral Daily  . sodium chloride flush  3 mL Intravenous Q12H   Continuous Infusions: . sodium chloride 20 mL/hr at 10/04/20 0700  . sodium chloride    . sodium chloride    . albumin human    .  ceFAZolin (ANCEF) IV Stopped (10/04/20 0441)  . dexmedetomidine (PRECEDEX) IV infusion Stopped (10/03/20 2322)  . epinephrine Stopped (10/03/20 2040)  . insulin 1.3 mL/hr at 10/04/20 0700  . lactated ringers    . lactated ringers    . lactated ringers 10 mL/hr at 10/04/20 0700  . milrinone 0.125 mcg/kg/min (10/04/20 0700)  . nitroGLYCERIN Stopped (10/03/20 1236)  . phenylephrine (NEO-SYNEPHRINE) Adult infusion Stopped (10/04/20 0321)  . potassium chloride     PRN Meds: sodium chloride, albumin human, albuterol, dextrose, lactated ringers, metoprolol tartrate, midazolam, morphine injection, ondansetron (ZOFRAN) IV, oxyCODONE, simethicone, sodium chloride flush, traMADol   Vital Signs    Vitals:   10/04/20 0600 10/04/20 0635 10/04/20 0640 10/04/20 0715   BP: 119/87   128/88  Pulse: 89 89 87 87  Resp: (!) 27 (!) 30 (!) 26 (!) 29  Temp: 100.04 F (37.8 C) 99.86 F (37.7 C) 99.86 F (37.7 C) 99.68 F (37.6 C)  TempSrc:      SpO2: 95% 94% 94% 94%  Weight:      Height:        Intake/Output Summary (Last 24 hours) at 10/04/2020 0844 Last data filed at 10/04/2020 0700 Gross per 24 hour  Intake 4801.02 ml  Output 2543 ml  Net 2258.02 ml   Last 3 Weights 10/04/2020 10/03/2020 10/02/2020  Weight (lbs) 267 lb 6.7 oz 254 lb 13.6 oz 257 lb 15 oz  Weight (kg) 121.3 kg 115.6 kg 117 kg      Telemetry    Sinus rhythm- Personally Reviewed  ECG    NSR, LAD - Personally Reviewed  Physical Exam   Obese.   GEN: No acute distress.   Neck: No JVD Cardiac: RRR, no murmurs, rubs, or gallops.  Respiratory: Clear to auscultation bilaterally. GI: Soft, nontender, non-distended  MS: No edema; No deformity. Neuro:  Nonfocal  Psych: Normal affect   Labs    High Sensitivity Troponin:   Recent Labs  Lab 09/30/20 1403 09/30/20 1525 09/30/20 1906 09/30/20 2247  TROPONINIHS 36* 65* 9,368* >24,000*  Chemistry Recent Labs  Lab 09/30/20 1525 10/01/20 0656 10/03/20 0308 10/03/20 0829 10/03/20 1138 10/03/20 1146 10/03/20 1643 10/03/20 1726 10/04/20 0417  NA  --    < > 139   < > 136   < > 142 141 139  K  --    < > 4.1   < > 5.1   < > 3.5 3.4* 4.6  CL  --    < > 106   < > 105  --   --  106 107  CO2  --    < > 26  --   --   --   --  19* 25  GLUCOSE  --    < > 113*   < > 166*  --   --  240* 115*  BUN  --    < > 15   < > 18  --   --  18 23*  CREATININE  --    < > 1.09   < > 1.00  --   --  1.51* 1.17  CALCIUM  --    < > 8.8*  --   --   --   --  7.4* 7.8*  PROT 7.0  --  6.1*  --   --   --   --   --   --   ALBUMIN 4.1  --  3.2*  --   --   --   --   --   --   AST 21  --  56*  --   --   --   --   --   --   ALT 24  --  36  --   --   --   --   --   --   ALKPHOS 54  --  43  --   --   --   --   --   --   BILITOT 0.5  --  1.1  --   --   --   --    --   --   GFRNONAA  --    < > >60  --   --   --   --  53* >60  ANIONGAP  --    < > 7  --   --   --   --  16* 7   < > = values in this interval not displayed.     Hematology Recent Labs  Lab 10/03/20 1214 10/03/20 1243 10/03/20 1643 10/03/20 1726 10/04/20 0417  WBC 31.8*  --   --  22.2* 20.5*  RBC 3.99*  --   --  3.97* 3.86*  HGB 12.6*   < > 12.6* 12.2* 12.1*  HCT 38.5*   < > 37.0* 37.5* 36.3*  MCV 96.5  --   --  94.5 94.0  MCH 31.6  --   --  30.7 31.3  MCHC 32.7  --   --  32.5 33.3  RDW 12.9  --   --  13.0 13.3  PLT 209  --   --  257 201   < > = values in this interval not displayed.    BNPNo results for input(s): BNP, PROBNP in the last 168 hours.   DDimer No results for input(s): DDIMER in the last 168 hours.   Radiology    DG Chest 1 View  Result Date: 10/02/2020 CLINICAL DATA:  Chest pain EXAM: CHEST  1 VIEW COMPARISON:  None. FINDINGS: The  heart size and mediastinal contours are within normal limits. Both lungs are clear. The visualized skeletal structures are unremarkable. IMPRESSION: No active disease. Electronically Signed   By: Ulyses Jarred M.D.   On: 10/02/2020 19:48   DG Chest Port 1 View  Result Date: 10/04/2020 CLINICAL DATA:  Chest tube.  Open-heart surgery. EXAM: PORTABLE CHEST 1 VIEW COMPARISON:  10/03/2020. FINDINGS: Swan-Ganz catheter, mediastinal drainage catheter, left chest tube in stable position. Prior CABG. Stable cardiomegaly. Low lung volumes with bibasilar atelectasis again noted. Mild left base infiltrate cannot be excluded. Tiny right pleural effusion cannot be excluded. No pneumothorax. IMPRESSION: 1. Lines and tubes including left chest tube in stable position. No pneumothorax. 2.  Prior CABG.  Stable cardiomegaly. 3. Low lung volumes with mild bibasilar atelectasis again noted. Mild left base infiltrate cannot be excluded. Tiny right pleural effusion cannot be excluded. Chest is stable from prior exam. Electronically Signed   By: Marcello Moores  Register    On: 10/04/2020 06:45   DG CHEST PORT 1 VIEW  Result Date: 10/03/2020 CLINICAL DATA:  Shortness of breath. EXAM: PORTABLE CHEST 1 VIEW COMPARISON:  Radiograph earlier today. FINDINGS: Interval extubation and removal of enteric tube. Right internal jugular Swan-Ganz catheter tip in the region of the main pulmonary outflow tract. Mediastinal drain and left-sided chest tube in place. Patient is post median sternotomy and CABG. Low lung volumes. Mild cardiomegaly likely accentuated by low volume technique. Vague retrocardiac opacity. No visualized pneumothorax. No convincing pulmonary edema. IMPRESSION: 1. Interval extubation. Swan-Ganz catheter tip in the region of the main pulmonary outflow tract. Mediastinal drain and left-sided chest tube in place. 2. Low lung volumes. Vague retrocardiac opacity, favor atelectasis and small volume pleural fluid. Electronically Signed   By: Keith Rake M.D.   On: 10/03/2020 18:09   DG Chest Port 1 View  Result Date: 10/03/2020 CLINICAL DATA:  Myocardial infarction. EXAM: PORTABLE CHEST 1 VIEW COMPARISON:  October 02, 2020. FINDINGS: Endotracheal and nasogastric tubes appear to be in grossly good position. Right internal jugular Swan-Ganz catheter is noted with tip in expected position of main pulmonary artery. No pneumothorax or effusion is noted. Right lung is clear. Mild left basilar subsegmental atelectasis is noted. Bony thorax is unremarkable. Left-sided chest tube is noted. IMPRESSION: Left-sided chest tube without pneumothorax. Mild left basilar subsegmental atelectasis. Endotracheal and nasogastric tubes are in good position. Electronically Signed   By: Marijo Conception M.D.   On: 10/03/2020 13:01   ECHO INTRAOPERATIVE TEE  Result Date: 10/03/2020  *INTRAOPERATIVE TRANSESOPHAGEAL REPORT *  Patient Name:   NIGEL ERICSSON Date of Exam: 10/03/2020 Medical Rec #:  [id number]       Height:       68.0 in Accession #:    [id number]      Weight:       254.9 lb Date of Birth:   24-Oct-1960      BSA:          2.27 m Patient Age:    39 years        BP:           120/78 mmHg Patient Gender: M               HR:           112 bpm. Exam Location:  Inpatient Transesophogeal exam was perform intraoperatively during surgical procedure. Patient was closely monitored under general anesthesia during the entirety of examination. Indications:     CABG Sonographer:  Clayton Lefort RDCS (AE) Performing Phys: Adele Barthel MD Diagnosing Phys: Adele Barthel MD Complications: No known complications during this procedure. POST-OP IMPRESSIONS - Left Ventricle: The left ventricle is unchanged from pre-bypass. - Right Ventricle: The right ventricle appears unchanged from pre-bypass. - Aorta: The aorta appears unchanged from pre-bypass. - Left Atrial Appendage: The left atrial appendage appears unchanged from pre-bypass. - Aortic Valve: The aortic valve appears unchanged from pre-bypass. - Mitral Valve: The mitral valve appears unchanged from pre-bypass. - Tricuspid Valve: There is trivial regurgitation. - Pulmonic Valve: The pulmonic valve appears unchanged from pre-bypass. - Interatrial Septum: The interatrial septum appears unchanged from pre-bypass. - Pericardium: The pericardium appears unchanged from pre-bypass. PRE-OP FINDINGS  Left Ventricle: The left ventricle has normal systolic function, with an ejection fraction of 55-60%. The cavity size was normal. There is no increase in left ventricular wall thickness. There is no left ventricular hypertrophy. Right Ventricle: The right ventricle has normal systolic function. The cavity was normal. There is no increase in right ventricular wall thickness. Left Atrium: Left atrial size was normal in size. No left atrial/left atrial appendage thrombus was detected. Right Atrium: Right atrial size was normal in size. Interatrial Septum: No atrial level shunt detected by color flow Doppler. Pericardium: There is no evidence of pericardial effusion. Mitral Valve: The  mitral valve is normal in structure. Mitral valve regurgitation is trivial by color flow Doppler. Tricuspid Valve: The tricuspid valve was normal in structure. Tricuspid valve regurgitation was not visualized by color flow Doppler. Aortic Valve: The aortic valve is normal in structure. Aortic valve regurgitation was not visualized by color flow Doppler. Pulmonic Valve: The pulmonic valve was normal in structure. Pulmonic valve regurgitation is not visualized by color flow Doppler. Aorta: The aortic root, ascending aorta and aortic arch are normal in size and structure. +--------------+--------++ LEFT VENTRICLE         +--------------+--------++ PLAX 2D                +--------------+--------++ LVIDd:        6.20 cm  +--------------+--------++ LVIDs:        4.30 cm  +--------------+--------++ LVOT diam:    2.10 cm  +--------------+--------++ LV SV:        111 ml   +--------------+--------++ LV SV Index:  46.10    +--------------+--------++ LVOT Area:    3.46 cm +--------------+--------++                        +--------------+--------++  +-------------+-------++ AORTA                +-------------+-------++ Ao Root diam:4.00 cm +-------------+-------++  +--------------+-------+ SHUNTS                +--------------+-------+ Systemic Diam:2.10 cm +--------------+-------+  Adele Barthel MD Electronically signed by Adele Barthel MD Signature Date/Time: 10/03/2020/4:55:50 PM    Final     Cardiac Studies    Cardiac catheterization 09/30/2020    Prox LAD to Mid LAD lesion is 90% stenosed.  Mid LAD lesion is 90% stenosed.  1st Diag lesion is 40% stenosed.  Mid Cx to Dist Cx lesion is 100% stenosed.  RV Branch lesion is 90% stenosed.  2nd RPL lesion is 70% stenosed.  The left ventricular systolic function is normal.  LV end diastolic pressure is normal.  The left ventricular ejection fraction is 50-55% by visual estimate.  Post intervention, there  is a 40% residual stenosis.  Balloon angioplasty was performed  using a BALLOON SAPPHIRE 2.5X12.  1. Severe 2 vessel obstructive CAD. There is severe diffuse segmental disease in the proximal to mid LAD 90% 100% occlusion of the LCx distally with thrombus 2. EF 50-55% with mid inferior HK 3. Normal LV EDP 4. Successful PCI of the distal LCx with POBA.  Plan: will resume IV heparin in 8 hours. Continue IV Aggrastat. Initiate beta blocker and high dose statin. Continue IV Ntg. Will consult CT surgery for CABG this admission given severe LAD disease. The LAD and OM 2 and 3 appear to be acceptable targets.    ECHO 10/01/2020  1. Left ventricular ejection fraction, by estimation, is 45 to 50%. The  left ventricle has mildly decreased function. The left ventricle has no  regional wall motion abnormalities. There is mild concentric left  ventricular hypertrophy. Left ventricular  diastolic parameters are consistent with Grade I diastolic dysfunction  (impaired relaxation). There is moderate hypokinesis of the left  ventricular, mid-apical anterior wall. There is moderate hypokinesis of  the left ventricular, entire inferolateral  wall.  2. Right ventricular systolic function is normal. The right ventricular  size is normal.  3. Left atrial size was mildly dilated.  4. The mitral valve is normal in structure. No evidence of mitral valve  regurgitation. No evidence of mitral stenosis.  5. The aortic valve is tricuspid. Aortic valve regurgitation is not  visualized. No aortic stenosis is present.  6. There is mild dilatation of the ascending aorta, measuring 42 mm.  7. The inferior vena cava is normal in size with greater than 50%  respiratory variability, suggesting right atrial pressure of 3 mmHg.    Patient Profile     60 y.o. male previously healthy other than obesity and GERD, presents with ECG suggestive of acute inferior STEMI and found to have severe obstructive  two-vessel disease, with plain balloon angioplasty of the culprit distal left circumflex lesion, plan for subsequent bypass surgery.  Assessment & Plan    1. STEMI lateral. S/p emergent POBA of the LCx. Residual severe segmental disease in the LAD. Now s/p CABG with LIMA to LAD, SVG to diagonal and SVG to OM. Looks good today. Would plan ASA and add Plavix prior to DC due to ACS. 2. HLD on high dose statin.  For questions or updates, please contact [place] Please consult [email or web address] for contact info under        Signed, Calin Fantroy Martinique, MD  10/04/2020, 8:44 AM

## 2020-10-04 NOTE — Progress Notes (Signed)
RT note. Patient currently on 2L  sat 95% w/ stable VS. Patient not needing bipap at this time, bipap is in patients room if he needs it later tonight. RT will continue to monitor.

## 2020-10-05 ENCOUNTER — Inpatient Hospital Stay (HOSPITAL_COMMUNITY): Payer: 59

## 2020-10-05 LAB — GLUCOSE, CAPILLARY
Glucose-Capillary: 111 mg/dL — ABNORMAL HIGH (ref 70–99)
Glucose-Capillary: 186 mg/dL — ABNORMAL HIGH (ref 70–99)
Glucose-Capillary: 102 mg/dL — ABNORMAL HIGH (ref 70–99)
Glucose-Capillary: 114 mg/dL — ABNORMAL HIGH (ref 70–99)
Glucose-Capillary: 97 mg/dL (ref 70–99)

## 2020-10-05 LAB — BASIC METABOLIC PANEL
Anion gap: 6 (ref 5–15)
BUN: 31 mg/dL — ABNORMAL HIGH (ref 6–20)
CO2: 30 mmol/L (ref 22–32)
Calcium: 8.2 mg/dL — ABNORMAL LOW (ref 8.9–10.3)
Chloride: 102 mmol/L (ref 98–111)
Creatinine, Ser: 1.17 mg/dL (ref 0.61–1.24)
GFR, Estimated: >60 mL/min (ref >60)
Glucose, Bld: 119 mg/dL — ABNORMAL HIGH (ref 70–99)
Potassium: 4.6 mmol/L (ref 3.5–5.1)
Sodium: 138 mmol/L (ref 135–145)

## 2020-10-05 LAB — CBC
HCT: 35.7 % — ABNORMAL LOW (ref 39.0–52.0)
Hemoglobin: 11.4 g/dL — ABNORMAL LOW (ref 13.0–17.0)
MCH: 31.1 pg (ref 26.0–34.0)
MCHC: 31.9 g/dL (ref 30.0–36.0)
MCV: 97.5 fL (ref 80.0–100.0)
Platelets: 216 10*3/uL (ref 150–400)
RBC: 3.66 MIL/uL — ABNORMAL LOW (ref 4.22–5.81)
RDW: 13.5 % (ref 11.5–15.5)
WBC: 21 10*3/uL — ABNORMAL HIGH (ref 4.0–10.5)
nRBC: 0 % (ref 0.0–0.2)

## 2020-10-05 MED ORDER — INSULIN ASPART 100 UNIT/ML IJ SOLN
0.0000 [IU] | Freq: Every day | INTRAMUSCULAR | Status: DC
Start: 2020-10-05 — End: 2020-10-07

## 2020-10-05 MED ORDER — SODIUM CHLORIDE 0.9% FLUSH: 3.0000 mL | INTRAVENOUS | Status: DC | PRN

## 2020-10-05 MED ORDER — INSULIN ASPART 100 UNIT/ML IJ SOLN
0.0000 [IU] | Freq: Three times a day (TID) | INTRAMUSCULAR | Status: DC
  Administered 2020-10-06: 2 [IU] via SUBCUTANEOUS

## 2020-10-05 MED ORDER — POTASSIUM CHLORIDE CRYS ER 10 MEQ PO TBCR
20.0000 meq | EXTENDED_RELEASE_TABLET | Freq: Every day | ORAL | Status: DC
  Administered 2020-10-05 – 2020-10-07 (×3): 20 meq via ORAL
  Filled 2020-10-05 (×5): qty 2

## 2020-10-05 MED ORDER — MAGNESIUM HYDROXIDE 400 MG/5ML PO SUSP: 30.0000 mL | Freq: Every day | ORAL | Status: DC | PRN

## 2020-10-05 MED ORDER — ~~LOC~~ CARDIAC SURGERY, PATIENT & FAMILY EDUCATION: Freq: Once | Status: DC

## 2020-10-05 MED ORDER — SODIUM CHLORIDE 0.9% FLUSH
3.0000 mL | Freq: Two times a day (BID) | INTRAVENOUS | Status: DC
  Administered 2020-10-05 – 2020-10-07 (×4): 3 mL via INTRAVENOUS

## 2020-10-05 MED ORDER — ALUM & MAG HYDROXIDE-SIMETH 200-200-20 MG/5ML PO SUSP: 15.0000 mL | Freq: Four times a day (QID) | ORAL | Status: DC | PRN

## 2020-10-05 MED ORDER — SODIUM CHLORIDE 0.9 % IV SOLN: 250.0000 mL | INTRAVENOUS | Status: DC | PRN

## 2020-10-05 MED ORDER — FUROSEMIDE 40 MG PO TABS
40.0000 mg | ORAL_TABLET | Freq: Every day | ORAL | Status: DC
  Administered 2020-10-05 – 2020-10-07 (×3): 40 mg via ORAL
  Filled 2020-10-05 (×3): qty 1

## 2020-10-05 NOTE — Progress Notes (Signed)
Patient brought to 4E from [facility]. Telemetry box applied, CCMD notified. VSS. Patient states 0/10 pain scale. Wife present. Patient oriented to room and staff. Call bell in reach.  Daymon Larsen, RN

## 2020-10-05 NOTE — Progress Notes (Signed)
2 Days Post-Op Procedure(s) (LRB): CORONARY ARTERY BYPASS GRAFTING (CABG) X THREE, ON PUMP, USING LEFT INTERNAL MAMMARY ARTERY AND RIGHT GREATER SAPHENOUS ENDOSCOPIC VEIN HARVEST CONDUITS (N/A) Subjective: Feels much better today  Objective: Vital signs in last 24 hours: Temp:  [98.1 F (36.7 C)-99 F (37.2 C)] 99 F (37.2 C) (06/08 0759) Pulse Rate:  [78-95] 89 (06/08 0700) Cardiac Rhythm: Normal sinus rhythm (06/08 0400) Resp:  [13-35] 21 (06/08 0700) BP: (114-142)/(70-98) 117/84 (06/08 0700) SpO2:  [88 %-99 %] 96 % (06/08 0700) Weight:  [120.2 kg] 120.2 kg (06/08 0600)  Hemodynamic parameters for last 24 hours:    Intake/Output from previous day: 06/07 0701 - 06/08 0700 In: 1012.9 [P.O.:580; I.V.:33; IV Piggyback:400] Out: 1835 [Urine:1835] Intake/Output this shift: No intake/output data recorded.  General appearance: alert, cooperative and no distress Neurologic: intact Heart: regular rate and rhythm Lungs: diminished breath sounds bibasilar Abdomen: normal findings: soft, non-tender  Lab Results: Recent Labs    10/04/20 1622 10/05/20 0510  WBC 23.8* 21.0*  HGB 12.2* 11.4*  HCT 36.9* 35.7*  PLT 217 216   BMET:  Recent Labs    10/04/20 1622 10/05/20 0510  NA 138 138  K 4.5 4.6  CL 106 102  CO2 27 30  GLUCOSE 120* 119*  BUN 27* 31*  CREATININE 1.23 1.17  CALCIUM 8.0* 8.2*    PT/INR:  Recent Labs    10/03/20 1214  LABPROT 17.2*  INR 1.4*   ABG    Component Value Date/Time   PHART 7.292 (L) 10/03/2020 1643   HCO3 15.8 (L) 10/03/2020 1643   TCO2 17 (L) 10/03/2020 1643   ACIDBASEDEF 10.0 (H) 10/03/2020 1643   O2SAT 91.0 10/03/2020 1643   CBG (last 3)  Recent Labs    10/04/20 2034 10/04/20 2352 10/05/20 0507  GLUCAP 116* 113* 111*    Assessment/Plan: S/P Procedure(s) (LRB): CORONARY ARTERY BYPASS GRAFTING (CABG) X THREE, ON PUMP, USING LEFT INTERNAL MAMMARY ARTERY AND RIGHT GREATER SAPHENOUS ENDOSCOPIC VEIN HARVEST CONDUITS (N/A) Plan  for transfer to step-down: see transfer orders  Doing well POD # 2 CV- in SR  ASA, beta blocker, statin RESP- continue IS for atelectasis RENAL- creatinine and lytes Ok  Still edematous- PO Lasix ENDO- CBG better, no hx of diabetes  Dc Levemir, CBG/ SSI AC and HS GI- tolerating Po SCD + enoxaparin for DVT prophylaxis Cardiac rehab DC central line   LOS: 5 days    Melrose Nakayama 10/05/2020

## 2020-10-05 NOTE — Progress Notes (Signed)
Pt has PRN Bipap orders.  Pt is on 2L [place] at this time, no distress noted.

## 2020-10-05 NOTE — Progress Notes (Signed)
CARDIAC REHAB PHASE I   PRE:  Rate/Rhythm: 89 SR    BP: sitting 119/80    SaO2: 98 2L  MODE:  Ambulation: 740 ft   POST:  Rate/Rhythm: 102 ST    BP: sitting 122/57     SaO2: 92 2L  Pt eager to walk. Stood with min assist and walked with RW and 2L. Attempted RA but desat to 88-90 RA with a few steps. Pt rested x1 after 1st lap. To bed after walk, mod-max assist for legs. VSS. Encouraged another walk and IS. [id number]   Darrick Meigs CES, ACSM 10/05/2020 3:03 PM

## 2020-10-05 NOTE — Progress Notes (Signed)
RN to pull central line and secure chat IVT when complete.

## 2020-10-06 ENCOUNTER — Inpatient Hospital Stay (HOSPITAL_COMMUNITY): Payer: 59

## 2020-10-06 LAB — GLUCOSE, CAPILLARY
Glucose-Capillary: 111 mg/dL — ABNORMAL HIGH (ref 70–99)
Glucose-Capillary: 129 mg/dL — ABNORMAL HIGH (ref 70–99)
Glucose-Capillary: 90 mg/dL (ref 70–99)
Glucose-Capillary: 121 mg/dL — ABNORMAL HIGH (ref 70–99)

## 2020-10-06 LAB — BPAM RBC
Blood Product Expiration Date: 202206282359
Blood Product Expiration Date: 202206292359
Unit Type and Rh: 6200
Unit Type and Rh: 6200

## 2020-10-06 LAB — BASIC METABOLIC PANEL
Anion gap: 7 (ref 5–15)
BUN: 30 mg/dL — ABNORMAL HIGH (ref 6–20)
CO2: 30 mmol/L (ref 22–32)
Calcium: 8.5 mg/dL — ABNORMAL LOW (ref 8.9–10.3)
Chloride: 99 mmol/L (ref 98–111)
Creatinine, Ser: 1.06 mg/dL (ref 0.61–1.24)
GFR, Estimated: >60 mL/min (ref >60)
Glucose, Bld: 107 mg/dL — ABNORMAL HIGH (ref 70–99)
Potassium: 4.5 mmol/L (ref 3.5–5.1)
Sodium: 136 mmol/L (ref 135–145)

## 2020-10-06 LAB — CBC
HCT: 35.4 % — ABNORMAL LOW (ref 39.0–52.0)
Hemoglobin: 11.5 g/dL — ABNORMAL LOW (ref 13.0–17.0)
MCH: 31 pg (ref 26.0–34.0)
MCHC: 32.5 g/dL (ref 30.0–36.0)
MCV: 95.4 fL (ref 80.0–100.0)
Platelets: 228 10*3/uL (ref 150–400)
RBC: 3.71 MIL/uL — ABNORMAL LOW (ref 4.22–5.81)
RDW: 13.3 % (ref 11.5–15.5)
WBC: 16.8 10*3/uL — ABNORMAL HIGH (ref 4.0–10.5)
nRBC: 0 % (ref 0.0–0.2)

## 2020-10-06 LAB — TYPE AND SCREEN
ABO/RH(D): A POS
Antibody Screen: NEGATIVE
Unit division: 0
Unit division: 0

## 2020-10-06 MED ORDER — CLOPIDOGREL BISULFATE 75 MG PO TABS
75.0000 mg | ORAL_TABLET | Freq: Every day | ORAL | Status: DC
  Administered 2020-10-07: 75 mg via ORAL
  Filled 2020-10-06: qty 1

## 2020-10-06 MED ORDER — ASPIRIN 81 MG PO CHEW: 81.0000 mg | CHEWABLE_TABLET | Freq: Every day | ORAL | Status: DC

## 2020-10-06 MED ORDER — ASPIRIN EC 81 MG PO TBEC
81.0000 mg | DELAYED_RELEASE_TABLET | Freq: Every day | ORAL | Status: DC
  Administered 2020-10-07: 81 mg via ORAL
  Filled 2020-10-06: qty 1

## 2020-10-06 NOTE — Progress Notes (Signed)
CARDIAC REHAB PHASE I   PRE:  Rate/Rhythm: 83 SR    BP: sitting 120/85    SaO2: 93 RA  MODE:  Ambulation: 900 ft   POST:  Rate/Rhythm: 107 ST    BP: sitting 127/90     SaO2: 90 RA  Ambulated first lap with RW and second without. Steady, no c/o. Doing well. Able to stand without using his arms. Encouraged IS and to try to sleep without O2.  [phone]   Darrick Meigs CES, ACSM 10/06/2020 3:13 PM

## 2020-10-06 NOTE — Progress Notes (Signed)
Mobility Specialist: Progress Note   10/06/20 1056  Mobility  Activity Ambulated in hall  Level of Assistance Modified independent, requires aide device or extra time  Assistive Device Front wheel walker  Distance Ambulated (ft) 940 ft  Mobility Ambulated independently in hallway  Mobility Response Tolerated well  Mobility performed by Mobility specialist  Bed Position Chair  $Mobility charge 1 Mobility   Pre-Mobility on 2 L/min [place]: 77 HR, 100% SpO2 During Mobility on RA: 101 HR, 93-94% SpO2 Post-Mobility on RA: 91 HR, 132/87 BP, 95% SpO2  Pt asx during ambulation. Pt was able to wean off of O2 during walk. Pt to recliner after walk with pt's wife present in room.   [facility] Keith Franklin Mobility Specialist Mobility Specialist Phone: [phone]

## 2020-10-06 NOTE — Discharge Summary (Addendum)
Physician Discharge Summary  Patient ID: Keith Franklin MRN: [id number] DOB/AGE: [birth year] 60 y.o.  Admit date: 09/30/2020 Discharge date: 10/07/2020  Admission Diagnoses:  Principal Problem:   Acute ST elevation myocardial infarction (STEMI) involving left circumflex coronary artery (HCC) Active Problems:   Obesity (BMI 30-39.9)   Hyperglycemia  Discharge Diagnoses:  Principal Problem:   Acute ST elevation myocardial infarction (STEMI) involving left circumflex coronary artery (HCC) Active Problems:   Obesity (BMI 30-39.9)   Hyperglycemia   STEMI involving left circumflex coronary artery (HCC)   PEA cardiac arrest   S/P CABG x 3    Discharged Condition: good  History of Present Illness:  The patient is a 60 year old gentleman who presented with chest pain and ruled in for an ST elevation MI.  He was taken emergently to the cardiac catheterization laboratory where his circumflex was found to be totally occluded.  Dr. Peter  Martinique was able to reestablish flow through the circumflex, but he had residual severe LAD and diagonal disease and moderate residual circumflex disease.  He was advised to undergo coronary artery bypass grafting.  The indications, risks, benefits, and alternatives were discussed in detail with the patient.  He understood and accepted the risks and agreed to proceed.  Hospital Course:   [name] remained stable following left heart catheterization.  Surgery was scheduled. The patient was brought to the preoperative holding area on 10/03/2020.  There while lines were being placed and he was given sedation, he had a bradycardic event with a pulseless electrical activity cardiac arrest.  CPR was initiated.  Epinephrine and atropine were given.  CPR was performed for less than 2 minutes.  The patient had return of spontaneous circulation, was taken emergently to the operating room.  He was intubated during the course of his resuscitation.  On arrival to the  OR, he was placed on the OR bed.  The EKG showed sinus tachycardia and his blood pressure was stable. We proceeded with CABG x3 with a left internal mammary artery grafted to the left anterior descending coronary artery and saphenous vein grafted to the first diagonal and third obtuse marginal coronary arteries.  Following the procedure, he separated from cardiopulmonary bypass without difficulty on milrinone and epinephrine infusions.  He was transferred to the surgical ICU in stable condition.  He remained hemodynamically stable.  He was weaned from the ventilator and extubated by 4 PM on the day of surgery.  On the first postoperative day, the milrinone was weaned and discontinued.  Chest tubes and monitoring lines were removed.  He was mobilized.  This was monitored and sliding scale insulin was provided as required.  Diet was begun and well-tolerated.  He was transitioned to progressive care, 4 E. on postop day 2.  Follow-up chest x-ray was satisfactory with minimal bibasilar atelectasis and very small pleural effusions.  Patient wires were removed on postop day 3.  Lasix was initiated the following day.  Urine output has been good but he will require further diuresis as outpatient for a short-term.  Oxygen has been weaned and he maintains good saturations on room air.  He does occasionally have minor desaturations at night and may require a sleep study in the future to further evaluate for potential obstructive sleep apnea.  Incisions are all noted to be healing well without evidence of infection.  He is tolerating routine activity progression using standard cardiac surgical postoperative protocols.  He has a minor expected acute blood loss anemia and values are stable.  Most recent hemoglobin hematocrit dated 10/06/2020 are 11.5/35.4 respectively.  Renal function remains within normal limits.  Blood sugars have been under adequate control using standard cardiac surgical protocols.  Most recent hemoglobin A1c  measured 10/02/2020 is 6.0.  He has excellent at home help, as his wife is a retired Therapist, sports and his daughter is a Psychologist, sport and exercise.  He is felt to be quite stable for discharge at this time.   Consults: cardiology  Significant Diagnostic Studies: DG Chest 1 View  Result Date: 10/02/2020 CLINICAL DATA:  Chest pain EXAM: CHEST  1 VIEW COMPARISON:  None. FINDINGS: The heart size and mediastinal contours are within normal limits. Both lungs are clear. The visualized skeletal structures are unremarkable. IMPRESSION: No active disease. Electronically Signed   By: Ulyses Jarred M.D.   On: 10/02/2020 19:48   DG Chest 2 View  Result Date: 10/06/2020 CLINICAL DATA:  Chest pain. EXAM: CHEST - 2 VIEW COMPARISON:  October 05, 2020. FINDINGS: Stable cardiomediastinal silhouette. No pneumothorax is noted. Minimal bibasilar subsegmental atelectasis. Bony thorax is unremarkable. IMPRESSION: Minimal bibasilar subsegmental atelectasis. Electronically Signed   By: Marijo Conception M.D.   On: 10/06/2020 08:10   CARDIAC CATHETERIZATION  Result Date: 09/30/2020  Prox LAD to Mid LAD lesion is 90% stenosed.  Mid LAD lesion is 90% stenosed.  1st Diag lesion is 40% stenosed.  Mid Cx to Dist Cx lesion is 100% stenosed.  RV Branch lesion is 90% stenosed.  2nd RPL lesion is 70% stenosed.  The left ventricular systolic function is normal.  LV end diastolic pressure is normal.  The left ventricular ejection fraction is 50-55% by visual estimate.  Post intervention, there is a 40% residual stenosis.  Balloon angioplasty was performed using a BALLOON SAPPHIRE 2.5X12.  1. Severe 2 vessel obstructive CAD.    There is severe diffuse segmental disease in the proximal to mid LAD 90%    100% occlusion of the LCx distally with thrombus 2. EF 50-55% with mid inferior HK 3. Normal LV EDP 4. Successful PCI of the distal LCx with POBA. Plan: will resume IV heparin in 8 hours. Continue IV Aggrastat. Initiate beta blocker and high dose statin.  Continue IV Ntg. Will consult CT surgery for CABG this admission given severe LAD disease. The LAD and OM 2 and 3 appear to be acceptable targets.   DG Chest Port 1 View  Result Date: 10/05/2020 CLINICAL DATA:  Sore chest.  CABG.  Chest tube removal. EXAM: PORTABLE CHEST 1 VIEW COMPARISON:  Chest x-ray 10/04/2020. FINDINGS: Interval removal of Swan-Ganz catheter, mediastinal drainage catheter, left chest tube. Right IJ sheath stable position. Prior CABG. Stable cardiomegaly. Low lung volumes with bibasilar atelectasis. No prominent pleural effusion. No pneumothorax. IMPRESSION: 1. Interim removal Swan-Ganz catheter, mediastinal drainage catheter, left chest tube. Right IJ sheath in stable position. No pneumothorax. 2. Prior CABG. Stable cardiomegaly. 3.  Low lung volumes with bibasilar atelectasis. Electronically Signed   By: Marcello Moores  Register   On: 10/05/2020 07:03   DG Chest Port 1 View  Result Date: 10/04/2020 CLINICAL DATA:  Chest tube.  Open-heart surgery. EXAM: PORTABLE CHEST 1 VIEW COMPARISON:  10/03/2020. FINDINGS: Swan-Ganz catheter, mediastinal drainage catheter, left chest tube in stable position. Prior CABG. Stable cardiomegaly. Low lung volumes with bibasilar atelectasis again noted. Mild left base infiltrate cannot be excluded. Tiny right pleural effusion cannot be excluded. No pneumothorax. IMPRESSION: 1. Lines and tubes including left chest tube in stable position. No pneumothorax. 2.  Prior CABG.  Stable cardiomegaly. 3. Low lung volumes with mild bibasilar atelectasis again noted. Mild left base infiltrate cannot be excluded. Tiny right pleural effusion cannot be excluded. Chest is stable from prior exam. Electronically Signed   By: Marcello Moores  Register   On: 10/04/2020 06:45   DG CHEST PORT 1 VIEW  Result Date: 10/03/2020 CLINICAL DATA:  Shortness of breath. EXAM: PORTABLE CHEST 1 VIEW COMPARISON:  Radiograph earlier today. FINDINGS: Interval extubation and removal of enteric tube. Right  internal jugular Swan-Ganz catheter tip in the region of the main pulmonary outflow tract. Mediastinal drain and left-sided chest tube in place. Patient is post median sternotomy and CABG. Low lung volumes. Mild cardiomegaly likely accentuated by low volume technique. Vague retrocardiac opacity. No visualized pneumothorax. No convincing pulmonary edema. IMPRESSION: 1. Interval extubation. Swan-Ganz catheter tip in the region of the main pulmonary outflow tract. Mediastinal drain and left-sided chest tube in place. 2. Low lung volumes. Vague retrocardiac opacity, favor atelectasis and small volume pleural fluid. Electronically Signed   By: Keith Rake M.D.   On: 10/03/2020 18:09   DG Chest Port 1 View  Result Date: 10/03/2020 CLINICAL DATA:  Myocardial infarction. EXAM: PORTABLE CHEST 1 VIEW COMPARISON:  October 02, 2020. FINDINGS: Endotracheal and nasogastric tubes appear to be in grossly good position. Right internal jugular Swan-Ganz catheter is noted with tip in expected position of main pulmonary artery. No pneumothorax or effusion is noted. Right lung is clear. Mild left basilar subsegmental atelectasis is noted. Bony thorax is unremarkable. Left-sided chest tube is noted. IMPRESSION: Left-sided chest tube without pneumothorax. Mild left basilar subsegmental atelectasis. Endotracheal and nasogastric tubes are in good position. Electronically Signed   By: Marijo Conception M.D.   On: 10/03/2020 13:01   DG Chest Port 1 View  Result Date: 09/30/2020 CLINICAL DATA:  Chest pain and shortness of breath EXAM: PORTABLE CHEST 1 VIEW COMPARISON:  None. FINDINGS: Lungs are clear. Heart is slightly enlarged with pulmonary vascularity normal. No adenopathy. No pneumothorax. No bone lesions. IMPRESSION: Cardiac prominence.  No edema or airspace opacity. Electronically Signed   By: Lowella Grip III M.D.   On: 09/30/2020 14:24   ECHOCARDIOGRAM COMPLETE  Result Date: 10/01/2020    ECHOCARDIOGRAM REPORT   Patient  Name:   [facility] Date of Exam: 10/01/2020 Medical Rec #:  [id number]       Height:       68.0 in Accession #:    [id number]      Weight:       250.0 lb Date of Birth:  January 13, 1961      BSA:          2.247 m Patient Age:    83 years        BP:           107/65 mmHg Patient Gender: M               HR:           79 bpm. Exam Location:  Inpatient Procedure: 2D Echo, Color Doppler and Cardiac Doppler Indications:    Acute MI i21.9  History:        Patient has no prior history of Echocardiogram examinations.                 CAD; Risk Factors:COVID+ 07/2019.  Sonographer:    Raquel Sarna Senior RDCS Referring Phys: [id number] PETER M Martinique IMPRESSIONS  1. Left ventricular ejection fraction, by estimation, is 45 to 50%. The left ventricle  has mildly decreased function. The left ventricle has no regional wall motion abnormalities. There is mild concentric left ventricular hypertrophy. Left ventricular diastolic parameters are consistent with Grade I diastolic dysfunction (impaired relaxation). There is moderate hypokinesis of the left ventricular, mid-apical anterior wall. There is moderate hypokinesis of the left ventricular, entire inferolateral wall.  2. Right ventricular systolic function is normal. The right ventricular size is normal.  3. Left atrial size was mildly dilated.  4. The mitral valve is normal in structure. No evidence of mitral valve regurgitation. No evidence of mitral stenosis.  5. The aortic valve is tricuspid. Aortic valve regurgitation is not visualized. No aortic stenosis is present.  6. There is mild dilatation of the ascending aorta, measuring 42 mm.  7. The inferior vena cava is normal in size with greater than 50% respiratory variability, suggesting right atrial pressure of 3 mmHg. FINDINGS  Left Ventricle: Left ventricular ejection fraction, by estimation, is 45 to 50%. The left ventricle has mildly decreased function. The left ventricle has no regional wall motion abnormalities. Moderate hypokinesis of  the left ventricular, mid-apical anterior wall. Moderate hypokinesis of the left ventricular, entire inferolateral wall. The left ventricular internal cavity size was normal in size. There is mild concentric left ventricular hypertrophy. Left ventricular diastolic parameters are consistent with Grade I diastolic dysfunction (impaired relaxation). Normal left ventricular filling pressure. Right Ventricle: The right ventricular size is normal. No increase in right ventricular wall thickness. Right ventricular systolic function is normal. Left Atrium: Left atrial size was mildly dilated. Right Atrium: Right atrial size was normal in size. Pericardium: There is no evidence of pericardial effusion. Mitral Valve: The mitral valve is normal in structure. No evidence of mitral valve regurgitation. No evidence of mitral valve stenosis. Tricuspid Valve: The tricuspid valve is normal in structure. Tricuspid valve regurgitation is not demonstrated. No evidence of tricuspid stenosis. Aortic Valve: The aortic valve is tricuspid. Aortic valve regurgitation is not visualized. No aortic stenosis is present. Pulmonic Valve: The pulmonic valve was normal in structure. Pulmonic valve regurgitation is not visualized. No evidence of pulmonic stenosis. Aorta: The aortic root is normal in size and structure. There is mild dilatation of the ascending aorta, measuring 42 mm. Venous: The inferior vena cava is normal in size with greater than 50% respiratory variability, suggesting right atrial pressure of 3 mmHg. IAS/Shunts: No atrial level shunt detected by color flow Doppler.  LEFT VENTRICLE PLAX 2D LVIDd:         4.90 cm  Diastology LVIDs:         3.50 cm  LV e' medial:    7.40 cm/s LV PW:         1.30 cm  LV E/e' medial:  9.1 LV IVS:        1.50 cm  LV e' lateral:   8.38 cm/s LVOT diam:     2.00 cm  LV E/e' lateral: 8.0 LV SV:         64 LV SV Index:   28 LVOT Area:     3.14 cm  RIGHT VENTRICLE RV S prime:     8.05 cm/s TAPSE (M-mode):  1.9 cm LEFT ATRIUM             Index       RIGHT ATRIUM           Index LA diam:        3.90 cm 1.74 cm/m  RA Area:     18.00 cm LA  Vol [facility]):   47.4 ml 21.09 ml/m RA Volume:   47.90 ml  21.32 ml/m LA Vol (A4C):   60.7 ml 27.01 ml/m LA Biplane Vol: 56.1 ml 24.97 ml/m  AORTIC VALVE LVOT Vmax:   101.00 cm/s LVOT Vmean:  79.200 cm/s LVOT VTI:    0.203 m  AORTA Ao Root diam: 3.90 cm Ao Asc diam:  4.00 cm MITRAL VALVE MV Area (PHT): 2.51 cm    SHUNTS MV Decel Time: 302 msec    Systemic VTI:  0.20 m MV E velocity: 67.30 cm/s  Systemic Diam: 2.00 cm MV A velocity: 76.70 cm/s MV E/A ratio:  0.88 Mihai Croitoru MD Electronically signed by Sanda Klein MD Signature Date/Time: 10/01/2020/10:57:51 AM    Final    ECHO INTRAOPERATIVE TEE  Result Date: 10/03/2020  *INTRAOPERATIVE TRANSESOPHAGEAL REPORT *  Patient Name:   [facility]OLF [facility] Date of Exam: 10/03/2020 Medical Rec #:  [id number]       Height:       68.0 in Accession #:    [id number]      Weight:       254.9 lb Date of Birth:  Jul 11, 1960      BSA:          2.27 m Patient Age:    80 years        BP:           120/78 mmHg Patient Gender: M               HR:           112 bpm. Exam Location:  Inpatient Transesophogeal exam was perform intraoperatively during surgical procedure. Patient was closely monitored under general anesthesia during the entirety of examination. Indications:     CABG Sonographer:     Clayton Lefort RDCS (AE) Performing Phys: Adele Barthel MD Diagnosing Phys: Adele Barthel MD Complications: No known complications during this procedure. POST-OP IMPRESSIONS - Left Ventricle: The left ventricle is unchanged from pre-bypass. - Right Ventricle: The right ventricle appears unchanged from pre-bypass. - Aorta: The aorta appears unchanged from pre-bypass. - Left Atrial Appendage: The left atrial appendage appears unchanged from pre-bypass. - Aortic Valve: The aortic valve appears unchanged from pre-bypass. - Mitral Valve: The mitral valve appears unchanged  from pre-bypass. - Tricuspid Valve: There is trivial regurgitation. - Pulmonic Valve: The pulmonic valve appears unchanged from pre-bypass. - Interatrial Septum: The interatrial septum appears unchanged from pre-bypass. - Pericardium: The pericardium appears unchanged from pre-bypass. PRE-OP FINDINGS  Left Ventricle: The left ventricle has normal systolic function, with an ejection fraction of 55-60%. The cavity size was normal. There is no increase in left ventricular wall thickness. There is no left ventricular hypertrophy. Right Ventricle: The right ventricle has normal systolic function. The cavity was normal. There is no increase in right ventricular wall thickness. Left Atrium: Left atrial size was normal in size. No left atrial/left atrial appendage thrombus was detected. Right Atrium: Right atrial size was normal in size. Interatrial Septum: No atrial level shunt detected by color flow Doppler. Pericardium: There is no evidence of pericardial effusion. Mitral Valve: The mitral valve is normal in structure. Mitral valve regurgitation is trivial by color flow Doppler. Tricuspid Valve: The tricuspid valve was normal in structure. Tricuspid valve regurgitation was not visualized by color flow Doppler. Aortic Valve: The aortic valve is normal in structure. Aortic valve regurgitation was not visualized by color flow Doppler. Pulmonic Valve: The pulmonic valve was normal in structure. Pulmonic valve regurgitation  is not visualized by color flow Doppler. Aorta: The aortic root, ascending aorta and aortic arch are normal in size and structure. +--------------+--------++ LEFT VENTRICLE         +--------------+--------++ PLAX 2D                +--------------+--------++ LVIDd:        6.20 cm  +--------------+--------++ LVIDs:        4.30 cm  +--------------+--------++ LVOT diam:    2.10 cm  +--------------+--------++ LV SV:        111 ml   +--------------+--------++ LV SV Index:  46.10     +--------------+--------++ LVOT Area:    3.46 cm +--------------+--------++                        +--------------+--------++  +-------------+-------++ AORTA                +-------------+-------++ Ao Root diam:4.00 cm +-------------+-------++  +--------------+-------+ SHUNTS                +--------------+-------+ Systemic Diam:2.10 cm +--------------+-------+  Adele Barthel MD Electronically signed by Adele Barthel MD Signature Date/Time: 10/03/2020/4:55:50 PM    Final      Treatments:   Operative Report   DATE OF PROCEDURE: 10/03/2020   PREOPERATIVE DIAGNOSIS:  Severe two-vessel coronary artery disease, status post ST elevation myocardial infarction.   POSTOPERATIVE DIAGNOSIS:  Severe two-vessel coronary artery disease, status post ST elevation myocardial infarction.   PROCEDURES PERFORMED:  Median sternotomy, extracorporeal circulation, coronary artery bypass grafting x3 (left internal mammary artery to LAD, saphenous vein graft to first diagonal, saphenous vein graft to the obtuse marginal 3), endoscopic vein harvest  right leg.   SURGEON:  Modesto Charon, MD   ASSISTANT:  Enid Cutter, PA-C   ANESTHESIA:  General.   FINDINGS:  Transesophageal echocardiography revealed overall no significant valvular pathology.  Overall, preserved left ventricular function by echocardiogram.  Good quality conduits.  Good quality targets.  Discharge Exam: Blood pressure (!) 141/83, pulse 93, temperature 98.3 F (36.8 C), temperature source Oral, resp. rate (!) 22, height 5\' 8"  (1.727 m), weight 119 kg, SpO2 98 %.  General appearance: alert, cooperative, and no distress Heart: regular rate and rhythm Lungs: min dim in the bases Abdomen: benign Extremities: + edema Wound: incis healing well   Disposition: Discharge disposition: 01-Home or Self Care      Discharge Instructions     Discharge patient   Complete by: As directed    Discharge disposition: 01-Home  or Self Care   Discharge patient date: 10/07/2020      Allergies as of 10/07/2020   No Known Allergies      Medication List     STOP taking these medications    HYDROcodone-acetaminophen 5-325 MG tablet Commonly known as: NORCO/VICODIN   naproxen 500 MG tablet Commonly known as: NAPROSYN   ondansetron 4 MG disintegrating tablet Commonly known as: Zofran ODT       TAKE these medications    aspirin 81 MG EC tablet Take 1 tablet (81 mg total) by mouth daily. Swallow whole. What changed:  how much to take when to take this reasons to take this   atorvastatin 80 MG tablet Commonly known as: LIPITOR Take 1 tablet (80 mg total) by mouth daily.   clopidogrel 75 MG tablet Commonly known as: PLAVIX Take 1 tablet (75 mg total) by mouth daily.   famotidine 10 MG tablet Commonly known as: PEPCID Take 10  mg by mouth daily.   furosemide 40 MG tablet Commonly known as: LASIX Take 1 tablet (40 mg total) by mouth daily.   GAS-X PO Take 2 tablets by mouth daily as needed (bloating).   lisinopril 2.5 MG tablet Commonly known as: ZESTRIL Take 1 tablet (2.5 mg total) by mouth daily.   metoprolol tartrate 25 MG tablet Commonly known as: LOPRESSOR Take 0.5 tablets (12.5 mg total) by mouth 2 (two) times daily.   oxyCODONE 5 MG immediate release tablet Commonly known as: Oxy IR/ROXICODONE Take 1 tablet (5 mg total) by mouth every 6 (six) hours as needed for up to 7 days for severe pain.   potassium chloride SA 20 MEQ tablet Commonly known as: KLOR-CON Take 1 tablet (20 mEq total) by mouth daily.       The patient has been discharged on:   1.Beta Blocker:  Yes [  y ]                              No   [   ]                              If No, reason:  2.Ace Inhibitor/ARB: Yes [   y]                                     No  [    ]                                     If No, reason:  3.Statin:   Yes Blue.Reese   ]                  No  [   ]                  If No,  reason:  4.Ecasa:  Yes  [   y]                  No   [   ]                  If No, reason:  DAPT with plavix 75/ASA 81   Signed: John Giovanni, PA-C 10/07/2020, 8:34 AM

## 2020-10-06 NOTE — Progress Notes (Signed)
RT note. Patient currently on rm air sat 97% w/stable VS. RT will continue to monitor

## 2020-10-06 NOTE — Progress Notes (Addendum)
Removed epicardial pacing wires. Ends intact. VSS. Patient educated on bedrest for one hour. Patient verbalized understanding.  Daymon Larsen, RN

## 2020-10-06 NOTE — Progress Notes (Addendum)
[address]       [place] [address]             [phone]      3 Days Post-Op Procedure(s) (LRB): CORONARY ARTERY BYPASS GRAFTING (CABG) X THREE, ON PUMP, USING LEFT INTERNAL MAMMARY ARTERY AND RIGHT GREATER SAPHENOUS ENDOSCOPIC VEIN HARVEST CONDUITS (N/A) Subjective:  Feels some soreness  Objective: Vital signs in last 24 hours: Temp:  [98.2 F (36.8 C)-99.7 F (37.6 C)] 98.7 F (37.1 C) (06/09 0328) Pulse Rate:  [66-99] 86 (06/09 0328) Cardiac Rhythm: Normal sinus rhythm (06/08 2021) Resp:  [16-28] 18 (06/09 0328) BP: (109-149)/(58-87) 138/82 (06/09 0328) SpO2:  [82 %-99 %] 98 % (06/09 0328) Weight:  [121.6 kg] 121.6 kg (06/09 0328)  Hemodynamic parameters for last 24 hours:    Intake/Output from previous day: 06/08 0701 - 06/09 0700 In: 740 [P.O.:740] Out: 1600 [Urine:1600] Intake/Output this shift: No intake/output data recorded.  General appearance: alert, cooperative, and no distress Heart: regular rate and rhythm Lungs: min dim in bases Abdomen: benign Extremities: trace edema Wound: incis healing well  Lab Results: Recent Labs    10/05/20 0510 10/06/20 0126  WBC 21.0* 16.8*  HGB 11.4* 11.5*  HCT 35.7* 35.4*  PLT 216 228   BMET:  Recent Labs    10/05/20 0510 10/06/20 0126  NA 138 136  K 4.6 4.5  CL 102 99  CO2 30 30  GLUCOSE 119* 107*  BUN 31* 30*  CREATININE 1.17 1.06  CALCIUM 8.2* 8.5*    PT/INR:  Recent Labs    10/03/20 1214  LABPROT 17.2*  INR 1.4*   ABG    Component Value Date/Time   PHART 7.292 (L) 10/03/2020 1643   HCO3 15.8 (L) 10/03/2020 1643   TCO2 17 (L) 10/03/2020 1643   ACIDBASEDEF 10.0 (H) 10/03/2020 1643   O2SAT 91.0 10/03/2020 1643   CBG (last 3)  Recent Labs    10/05/20 1541 10/05/20 2025 10/06/20 0610  GLUCAP 114* 97 111*    Meds Scheduled Meds:  acetaminophen  1,000 mg Oral Q6H   aspirin EC  325 mg Oral Daily   Or   aspirin  324 mg Per Tube Daily   atorvastatin  80 mg  Oral Daily   bisacodyl  10 mg Oral Daily   Or   bisacodyl  10 mg Rectal Daily   Chlorhexidine Gluconate Cloth  6 each Topical Daily   [facility], Patient & Family Education   Does not apply Once   docusate sodium  200 mg Oral Daily   enoxaparin (LOVENOX) injection  40 mg Subcutaneous QHS   furosemide  40 mg Oral Daily   insulin aspart  0-15 Units Subcutaneous TID WC   insulin aspart  0-5 Units Subcutaneous QHS   metoprolol tartrate  12.5 mg Oral BID   Or   metoprolol tartrate  12.5 mg Per Tube BID   pantoprazole  40 mg Oral Daily   potassium chloride  20 mEq Oral Daily   sodium chloride flush  3 mL Intravenous Q12H   Continuous Infusions:  sodium chloride     PRN Meds:.sodium chloride, albuterol, alum & mag hydroxide-simeth, magnesium hydroxide, metoprolol tartrate, ondansetron (ZOFRAN) IV, oxyCODONE, simethicone, sodium chloride flush  Xrays DG Chest Port 1 View  Result Date: 10/05/2020 CLINICAL DATA:  Sore chest.  CABG.  Chest tube removal. EXAM: PORTABLE CHEST 1 VIEW COMPARISON:  Chest x-ray 10/04/2020. FINDINGS: Interval removal of Swan-Ganz catheter,  mediastinal drainage catheter, left chest tube. Right IJ sheath stable position. Prior CABG. Stable cardiomegaly. Low lung volumes with bibasilar atelectasis. No prominent pleural effusion. No pneumothorax. IMPRESSION: 1. Interim removal Swan-Ganz catheter, mediastinal drainage catheter, left chest tube. Right IJ sheath in stable position. No pneumothorax. 2. Prior CABG. Stable cardiomegaly. 3.  Low lung volumes with bibasilar atelectasis. Electronically Signed   By: Marcello Moores  Register   On: 10/05/2020 07:03    Assessment/Plan: S/P Procedure(s) (LRB): CORONARY ARTERY BYPASS GRAFTING (CABG) X THREE, ON PUMP, USING LEFT INTERNAL MAMMARY ARTERY AND RIGHT GREATER SAPHENOUS ENDOSCOPIC VEIN HARVEST CONDUITS (N/A)   1 tmax 99.7, sBP 100's- 140's, NSR- hold on ACE-I /ARB 2 sats [place] 2 l 98% 3 H/H stable ABLA 4 leukocytosis  trend improved, reactive 5 CXR min atx, very tiny effus 6 excellent diuresis- cont laix for now at current dosing, weight up about 5 KG 7 routine pulm toilet/rehab     LOS: 6 days    John Giovanni PA-C Pager [phone] 10/06/2020  Patient seen and examined, agree with above CXR improving Overall looks good. DC pacing wires Start Plavix tomorrow  Remo Lipps C. Roxan Hockey, MD [facility] [phone]

## 2020-10-07 LAB — GLUCOSE, CAPILLARY: Glucose-Capillary: 104 mg/dL — ABNORMAL HIGH (ref 70–99)

## 2020-10-07 MED ORDER — LISINOPRIL 2.5 MG PO TABS
2.5000 mg | ORAL_TABLET | Freq: Every day | ORAL | Status: DC
  Administered 2020-10-07: 2.5 mg via ORAL
  Filled 2020-10-07: qty 1

## 2020-10-07 MED ORDER — POTASSIUM CHLORIDE CRYS ER 20 MEQ PO TBCR: 20.0000 meq | EXTENDED_RELEASE_TABLET | Freq: Every day | ORAL | 0 refills | Status: DC

## 2020-10-07 MED ORDER — FUROSEMIDE 40 MG PO TABS: 40.0000 mg | ORAL_TABLET | Freq: Every day | ORAL | 0 refills | Status: DC

## 2020-10-07 MED ORDER — OXYCODONE HCL 5 MG PO TABS: 5.0000 mg | ORAL_TABLET | Freq: Four times a day (QID) | ORAL | 0 refills | Status: AC | PRN

## 2020-10-07 MED ORDER — METOPROLOL TARTRATE 25 MG PO TABS: 12.5000 mg | ORAL_TABLET | Freq: Two times a day (BID) | ORAL | 1 refills | Status: DC

## 2020-10-07 MED ORDER — LISINOPRIL 2.5 MG PO TABS: 2.5000 mg | ORAL_TABLET | Freq: Every day | ORAL | 1 refills | Status: DC

## 2020-10-07 MED ORDER — ASPIRIN 81 MG PO TBEC: 81.0000 mg | DELAYED_RELEASE_TABLET | Freq: Every day | ORAL | Status: AC

## 2020-10-07 MED ORDER — ATORVASTATIN CALCIUM 80 MG PO TABS: 80.0000 mg | ORAL_TABLET | Freq: Every day | ORAL | 1 refills | Status: DC

## 2020-10-07 MED ORDER — CLOPIDOGREL BISULFATE 75 MG PO TABS: 75.0000 mg | ORAL_TABLET | Freq: Every day | ORAL | 1 refills | Status: DC

## 2020-10-07 MED FILL — Mannitol IV Soln 20%: INTRAVENOUS | Qty: 500 | Status: AC

## 2020-10-07 MED FILL — Sodium Bicarbonate IV Soln 8.4%: INTRAVENOUS | Qty: 50 | Status: AC

## 2020-10-07 MED FILL — Electrolyte-R (PH 7.4) Solution: INTRAVENOUS | Qty: 3000 | Status: AC

## 2020-10-07 MED FILL — Heparin Sodium (Porcine) Inj 1000 Unit/ML: INTRAMUSCULAR | Qty: 20 | Status: AC

## 2020-10-07 MED FILL — Lidocaine HCl Local Soln Prefilled Syringe 100 MG/5ML (2%): INTRAMUSCULAR | Qty: 5 | Status: AC

## 2020-10-07 MED FILL — Sodium Chloride IV Soln 0.9%: INTRAVENOUS | Qty: 4000 | Status: AC

## 2020-10-07 NOTE — Plan of Care (Signed)
  Problem: Education: Goal: Knowledge of General Education information will improve Description: Including pain rating scale, medication(s)/side effects and non-pharmacologic comfort measures Outcome: Adequate for Discharge   

## 2020-10-07 NOTE — Progress Notes (Signed)
[address]       [place] [address]             [phone]      4 Days Post-Op Procedure(s) (LRB): CORONARY ARTERY BYPASS GRAFTING (CABG) X THREE, ON PUMP, USING LEFT INTERNAL MAMMARY ARTERY AND RIGHT GREATER SAPHENOUS ENDOSCOPIC VEIN HARVEST CONDUITS (N/A) Subjective: Overall feels and is doing well  Objective: Vital signs in last 24 hours: Temp:  [98.3 F (36.8 C)-99.3 F (37.4 C)] 98.8 F (37.1 C) (06/10 0322) Pulse Rate:  [76-88] 76 (06/10 0322) Cardiac Rhythm: Normal sinus rhythm (06/09 1900) Resp:  [16-20] 19 (06/10 0322) BP: (121-138)/(75-88) 127/78 (06/10 0322) SpO2:  [94 %-98 %] 98 % (06/10 0322) Weight:  [010 kg] 119 kg (06/10 0629)  Hemodynamic parameters for last 24 hours:    Intake/Output from previous day: 06/09 0701 - 06/10 0700 In: 1450 [P.O.:1450] Out: 325 [Urine:325] Intake/Output this shift: No intake/output data recorded.  General appearance: alert, cooperative, and no distress Heart: regular rate and rhythm Lungs: min dim in the bases Abdomen: benign Extremities: + edema Wound: incis healing well  Lab Results: Recent Labs    10/05/20 0510 10/06/20 0126  WBC 21.0* 16.8*  HGB 11.4* 11.5*  HCT 35.7* 35.4*  PLT 216 228   BMET:  Recent Labs    10/05/20 0510 10/06/20 0126  NA 138 136  K 4.6 4.5  CL 102 99  CO2 30 30  GLUCOSE 119* 107*  BUN 31* 30*  CREATININE 1.17 1.06  CALCIUM 8.2* 8.5*    PT/INR: No results for input(s): LABPROT, INR in the last 72 hours. ABG    Component Value Date/Time   PHART 7.292 (L) 10/03/2020 1643   HCO3 15.8 (L) 10/03/2020 1643   TCO2 17 (L) 10/03/2020 1643   ACIDBASEDEF 10.0 (H) 10/03/2020 1643   O2SAT 91.0 10/03/2020 1643   CBG (last 3)  Recent Labs    10/06/20 1601 10/06/20 2130 10/07/20 0604  GLUCAP 90 121* 104*    Meds Scheduled Meds:  acetaminophen  1,000 mg Oral Q6H   aspirin EC  81 mg Oral Daily   Or   aspirin  81 mg Per Tube Daily   atorvastatin  80  mg Oral Daily   bisacodyl  10 mg Oral Daily   Or   bisacodyl  10 mg Rectal Daily   Chlorhexidine Gluconate Cloth  6 each Topical Daily   clopidogrel  75 mg Oral Daily   [facility], Patient & Family Education   Does not apply Once   docusate sodium  200 mg Oral Daily   enoxaparin (LOVENOX) injection  40 mg Subcutaneous QHS   furosemide  40 mg Oral Daily   insulin aspart  0-15 Units Subcutaneous TID WC   insulin aspart  0-5 Units Subcutaneous QHS   metoprolol tartrate  12.5 mg Oral BID   Or   metoprolol tartrate  12.5 mg Per Tube BID   pantoprazole  40 mg Oral Daily   potassium chloride  20 mEq Oral Daily   sodium chloride flush  3 mL Intravenous Q12H   Continuous Infusions:  sodium chloride     PRN Meds:.sodium chloride, albuterol, alum & mag hydroxide-simeth, magnesium hydroxide, metoprolol tartrate, ondansetron (ZOFRAN) IV, oxyCODONE, simethicone, sodium chloride flush  Xrays DG Chest 2 View  Result Date: 10/06/2020 CLINICAL DATA:  Chest pain. EXAM: CHEST - 2 VIEW COMPARISON:  October 05, 2020. FINDINGS: Stable cardiomediastinal silhouette. No pneumothorax is  noted. Minimal bibasilar subsegmental atelectasis. Bony thorax is unremarkable. IMPRESSION: Minimal bibasilar subsegmental atelectasis. Electronically Signed   By: Marijo Conception M.D.   On: 10/06/2020 08:10    Assessment/Plan: S/P Procedure(s) (LRB): CORONARY ARTERY BYPASS GRAFTING (CABG) X THREE, ON PUMP, USING LEFT INTERNAL MAMMARY ARTERY AND RIGHT GREATER SAPHENOUS ENDOSCOPIC VEIN HARVEST CONDUITS (N/A)    1 tmax 99.3, VSS 2 sats good on RA, some upper 80's at night, may need sleep study in near future after surgical recovery 3 weight trending down, UOP not accurate 4 no new labs 5 now on DAPT 6 will cont lasix for an additional week 7 BP reasonable control ,but occas elevation, will add low dose ACE-I 8 appears quite stable for d/c   LOS: 7 days    John Giovanni PA-C Pager [phone] 10/07/2020

## 2020-10-07 NOTE — Progress Notes (Signed)
Mobility Specialist: Progress Note   10/07/20 1028  Mobility  Activity Ambulated in hall  Level of Assistance Independent  Assistive Device None  Distance Ambulated (ft) 470 ft  Mobility Ambulated independently in hallway  Mobility Response Tolerated well  Mobility performed by Mobility specialist  Bed Position Chair  $Mobility charge 1 Mobility   Pre-Mobility: 91 HR Post-Mobility: 106 HR  Pt asx during ambulation. Reviewed precautions with pt. Pt had no questions for me. Pt is back in the recliner with family members present in the room.   [facility] Keith Franklin Mobility Specialist Mobility Specialist Phone: [phone]

## 2020-10-07 NOTE — Progress Notes (Signed)
Discharge instructions (including medications) discussed with and copy provided to patient/caregiver 

## 2020-10-07 NOTE — Progress Notes (Signed)
CARDIOLOGY RECOMMENDATIONS:  Discharge is anticipated in the next 48 hours. Recommendations for medications and follow up:  Discharge Medications: per [facility] Continue medications as they are currently listed in the [facility]. Exceptions to the above: none  Follow Up: The patient's Primary Cardiologist is Alexandr Oehler [place], MD  Follow up in the office in 2 week(s).  Signed,  Arriel Victor [place], MD  10:02 AM 10/07/2020  [facility]

## 2020-10-07 NOTE — Progress Notes (Signed)
CARDIAC REHAB PHASE I   Discussed IS, sternal precautions, diet, exercise, and CRPII with pt. Very receptive. Encouraged decreasing sugars/refined carbs. Will refer to [place]. Already watched d/c video. [place], ACSM 10/07/2020 9:26 AM

## 2020-10-12 ENCOUNTER — Telehealth (HOSPITAL_COMMUNITY): Payer: Self-pay

## 2020-10-12 NOTE — Telephone Encounter (Signed)
Pt insurance is active and benefits verified through [place] $30, DED $2,500/$1,239.78 met, out of pocket $6,000/$1,286.53 met, co-insurance 0%. no pre-authorization required. Passport, 10/11/2020@4 :26pm, REF# [id number]   Will contact patient to see if he is interested in the Cardiac Rehab Program. If interested, patient will need to complete follow up appt. Once completed, patient will be contacted for scheduling upon review by the RN Navigator.

## 2020-10-12 NOTE — Telephone Encounter (Signed)
Called patient to see if he is interested in the Cardiac Rehab Program. Patient expressed interest. Explained scheduling process and went over insurance, patient verbalized understanding. Will contact patient for scheduling once f/u has been completed.  °

## 2020-10-13 ENCOUNTER — Encounter (INDEPENDENT_AMBULATORY_CARE_PROVIDER_SITE_OTHER): Payer: Self-pay

## 2020-10-13 ENCOUNTER — Other Ambulatory Visit: Payer: Self-pay

## 2020-10-13 DIAGNOSIS — Z4802 Encounter for removal of sutures: Secondary | ICD-10-CM

## 2020-10-13 DIAGNOSIS — Z736 Limitation of activities due to disability: Secondary | ICD-10-CM

## 2020-10-14 ENCOUNTER — Telehealth: Payer: Self-pay

## 2020-10-14 NOTE — Telephone Encounter (Signed)
FMLA form completed and faxed to [phone]/attention [employer]. Beginning leave 09/30/2020 through 01/02/2021. STD form completed and faxed to [place] @ [phone].  Beginning leave 09/30/20 through 01/02/21. Forms mailed to home address per pt.'s request.

## 2020-10-18 ENCOUNTER — Telehealth: Payer: Self-pay

## 2020-10-18 ENCOUNTER — Other Ambulatory Visit: Payer: Self-pay | Admitting: *Deleted

## 2020-10-18 MED ORDER — TRAMADOL HCL 50 MG PO TABS: 50.0000 mg | ORAL_TABLET | Freq: Four times a day (QID) | ORAL | 0 refills | Status: DC | PRN

## 2020-10-18 NOTE — Progress Notes (Signed)
Mr. Reser called c/o pain s/p CABG 6/6 by Dr. Roxan Hockey. Patient states he has been gradually reducing the amount and frequency of oxycodone he was prescribed. Patient states he has been taking half a tablet of oxycodone with one tylenol. Patient states he has tried Tylenol solely and it has been ineffective. Patient states oxycodone makes him feel tingly and wishes for something not as strong. Per E. Barrett, PA, a prescription for tramadol called into patients preferred pharmacy.

## 2020-10-18 NOTE — Telephone Encounter (Signed)
Patient contacted the office concerned about a sharp pain that comes and goes, mainly when he is seating, non-provoking in his left lower back/side near ribs and waist-line.  He stated that he has not done anything recently to warrant this issue. Pain medications do not help and he has been taking oxycodone and Tylenol with no relief. He is not having any trouble with chest pain or shortness of breath and does not have any cough or swelling in his lowe extremities. Advised it may take time for him to began to feel better as he was just discharged from the hospital and surgery was 10/03/20 with Dr. Roxan Hockey. He stated that he is hyper aware of everything now. Advised to contact the office back if he had any more or if it continues or gets worse. He acknowledged receipt.

## 2020-10-25 NOTE — Progress Notes (Signed)
Cardiology Clinic Note   Patient Name: Nguyen Todorov Date of Encounter: 10/27/2020  Primary Care Provider:  Patient, No Pcp Per (Inactive) Primary Cardiologist:  Peter Martinique, MD  Patient Profile    Miki Blank 60 year old male presents to the clinic today for follow-up evaluation of his coronary artery disease status post CABG x3 on 10/03/2020.  Past Medical History    Past Medical History:  Diagnosis Date   Cancer ([place]) 2010   skin cancer, Basal Cell Carcinoma    COVID-19 07/2019   GERD (gastroesophageal reflux disease)    Past Surgical History:  Procedure Laterality Date   CORONARY ARTERY BYPASS GRAFT N/A 10/03/2020   Procedure: CORONARY ARTERY BYPASS GRAFTING (CABG) X THREE, ON PUMP, USING LEFT INTERNAL MAMMARY ARTERY AND RIGHT GREATER SAPHENOUS ENDOSCOPIC VEIN HARVEST CONDUITS;  Surgeon: Melrose Nakayama, MD;  Location: [place];  Service: Open Heart Surgery;  Laterality: N/A;  will harvest left radial artery for conduit Swan   CORONARY/GRAFT ACUTE MI REVASCULARIZATION N/A 09/30/2020   Procedure: Coronary/Graft Acute MI Revascularization;  Surgeon: Martinique, Peter M, MD;  Location: [facility];  Service: Cardiovascular;  Laterality: N/A;   LEFT HEART CATH AND CORONARY ANGIOGRAPHY N/A 09/30/2020   Procedure: LEFT HEART CATH AND CORONARY ANGIOGRAPHY;  Surgeon: Martinique, Peter M, MD;  Location: [place];  Service: Cardiovascular;  Laterality: N/A;   skin cancer removal      Allergies  No Known Allergies  History of Present Illness    Abijah Roussel is a PMH of hypoglycemia, PEA cardiac arrest, coronary artery disease, hyperlipidemia, and essential hypertension.  He presented to [facility] with chest pain and was ruled in for ST elevated myocardial infarction.  He was taken emergently to the cardiac Cath Lab which showed a totally occluded circumflex.  Dr. Martinique was able to reestablish flow through the circumflex.  However, he was also noted to  have severe LAD diagonal disease with moderate residual circumflex disease.  CABG was recommended.  He remained stable following his left heart catheterization.  He underwent CABG x3 on 10/03/2020.  While his lines were being placed and he was given sedation he had a bradycardic event with pulseless electrical activity cardiac arrest.  CPR was initiated.  It was performed for less than 2 minutes.  He had return of spontaneous circulation and was taken emergently to the operating room.  On arrival to the operating room he was noted to be tachycardic with stable blood pressure.  He received a LIMA-LAD, SVG to first diagonal and third obtuse marginal.  He was transferred to surgical ICU and remained hemodynamically stable.  He was weaned from the ventilator and extubated by 4 PM on the day of surgery.  He progressed well and tolerated his chest tubes and monitoring lines been removed.  His epicardial pacing wires were removed on postop day 3.  He was noted to have occasional monitor desaturations at night.  He was felt to potentially have OSA.  He continued to recover well without signs of infection.  He was discharged home in stable condition with his wife who is a retired Therapist, sports his daughter who is a Psychologist, sport and exercise on 10/07/2020.  He presents the clinic today for follow-up evaluation states he is slowly increasing his physical activity and walking about 3 to 4 minutes/day.  He reports that he was father had an MI at age 64.  He received CABG at that time.  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  We reviewed his cholesterol medications.  He reports that he has had intermittent periods of joint/muscle discomfort that lasted for about 48 hours and dissipates.  He has also noticed sleep disturbances.  I recommended sleep hygiene practices.  We discussed the importance of heart healthy low-sodium low-cholesterol diet.  His sternal incision and chest tube sites are healing well.  We reviewed signs  and symptoms of obstructive sleep apnea.  He does not yet have a sleep study scheduled but I recommended this in the near future.  We will give him a salty 6 diet sheet, have him maintain his sternal precautions and increase his physical activity as tolerated, and follow-up in 3 months.  Today he denies chest pain, shortness of breath, lower extremity edema, fatigue, palpitations, melena, hematuria, hemoptysis, diaphoresis, weakness, presyncope, syncope, orthopnea, and PND.  Home Medications    Prior to Admission medications   Medication Sig Start Date End Date Taking? Authorizing Provider  aspirin EC 81 MG EC tablet Take 1 tablet (81 mg total) by mouth daily. Swallow whole. 10/07/20   Gold, Wayne E, PA-C  atorvastatin (LIPITOR) 80 MG tablet Take 1 tablet (80 mg total) by mouth daily. 10/07/20   John Giovanni, PA-C  clopidogrel (PLAVIX) 75 MG tablet Take 1 tablet (75 mg total) by mouth daily. 10/07/20   Gold, Wayne E, PA-C  famotidine (PEPCID) 10 MG tablet Take 10 mg by mouth daily.    [provider]  furosemide (LASIX) 40 MG tablet Take 1 tablet (40 mg total) by mouth daily. 10/07/20   Gold, Wayne E, PA-C  lisinopril (ZESTRIL) 2.5 MG tablet Take 1 tablet (2.5 mg total) by mouth daily. 10/07/20   Gold, Patrick Jupiter E, PA-C  metoprolol tartrate (LOPRESSOR) 25 MG tablet Take 0.5 tablets (12.5 mg total) by mouth 2 (two) times daily. 10/07/20   Gold, Patrick Jupiter E, PA-C  potassium chloride (KLOR-CON) 20 MEQ tablet Take 1 tablet (20 mEq total) by mouth daily. 10/07/20   Gold, Wilder Glade, PA-C  Simethicone (GAS-X PO) Take 2 tablets by mouth daily as needed (bloating).    [provider]  traMADol (ULTRAM) 50 MG tablet Take 1 tablet (50 mg total) by mouth every 6 (six) hours as needed. 10/18/20   Barrett, Lodema Hong, PA-C    Family History    Family History  Problem Relation Age of Onset   Cancer Mother    Heart attack Father    He indicated that his mother is deceased. He indicated that his father is  deceased.  Social History    Social History   Socioeconomic History   Marital status: Married    Spouse name: Not on file   Number of children: Not on file   Years of education: Not on file   Highest education level: Not on file  Occupational History   Not on file  Tobacco Use   Smoking status: Never   Smokeless tobacco: Never  Vaping Use   Vaping Use: Never used  Substance and Sexual Activity   Alcohol use: Yes   Drug use: Never   Sexual activity: Not on file  Other Topics Concern   Not on file  Social History Narrative   Not on file   Social Determinants of Health   Financial Resource Strain: Not on file  Food Insecurity: Not on file  Transportation Needs: Not on file  Physical Activity: Not on file  Stress: Not on file  Social Connections: Not on file  Intimate  Partner Violence: Not on file     Review of Systems    General:  No chills, fever, night sweats or weight changes.  Cardiovascular:  No chest pain, dyspnea on exertion, edema, orthopnea, palpitations, paroxysmal nocturnal dyspnea. Dermatological: No rash, lesions/masses Respiratory: No cough, dyspnea Urologic: No hematuria, dysuria Abdominal:   No nausea, vomiting, diarrhea, bright red blood per rectum, melena, or hematemesis Neurologic:  No visual changes, wkns, changes in mental status. All other systems reviewed and are otherwise negative except as noted above.  Physical Exam    VS:  BP 102/78 (BP Location: Right Arm)   Pulse 83   Ht 5\' 8"  (1.727 m)   Wt 238 lb (108 kg)   BMI 36.19 kg/m  , BMI Body mass index is 36.19 kg/m. GEN: Well nourished, well developed, in no acute distress. HEENT: normal. Neck: Supple, no JVD, carotid bruits, or masses. Cardiac: RRR, no murmurs, rubs, or gallops. No clubbing, cyanosis, edema.  Radials/DP/PT 2+ and equal bilaterally.  Respiratory:  Respirations regular and unlabored, clear to auscultation bilaterally. GI: Soft, nontender, nondistended, BS + x 4. MS:  no deformity or atrophy. Skin: warm and dry, no rash. Neuro:  Strength and sensation are intact. Psych: Normal affect.  Accessory Clinical Findings    Recent Labs: 10/03/2020: ALT 36 10/04/2020: Magnesium 2.6 10/06/2020: BUN 30; Creatinine, Ser 1.06; Hemoglobin 11.5; Platelets 228; Potassium 4.5; Sodium 136   Recent Lipid Panel    Component Value Date/Time   CHOL 151 10/01/2020 0656   TRIG 60 10/01/2020 0656   HDL 37 (L) 10/01/2020 0656   CHOLHDL 4.1 10/01/2020 0656   VLDL 12 10/01/2020 0656   LDLCALC 102 (H) 10/01/2020 0656    ECG personally reviewed by me today-normal sinus rhythm possible left atrial enlargement left axis deviation anterior infarct undetermined age 20 bpm- No acute changes  Echocardiogram 10/01/2020 IMPRESSIONS     1. Left ventricular ejection fraction, by estimation, is 45 to 50%. The  left ventricle has mildly decreased function. The left ventricle has no  regional wall motion abnormalities. There is mild concentric left  ventricular hypertrophy. Left ventricular  diastolic parameters are consistent with Grade I diastolic dysfunction  (impaired relaxation). There is moderate hypokinesis of the left  ventricular, mid-apical anterior wall. There is moderate hypokinesis of  the left ventricular, entire inferolateral  wall.   2. Right ventricular systolic function is normal. The right ventricular  size is normal.   3. Left atrial size was mildly dilated.   4. The mitral valve is normal in structure. No evidence of mitral valve  regurgitation. No evidence of mitral stenosis.   5. The aortic valve is tricuspid. Aortic valve regurgitation is not  visualized. No aortic stenosis is present.   6. There is mild dilatation of the ascending aorta, measuring 42 mm.   7. The inferior vena cava is normal in size with greater than 50%  respiratory variability, suggesting right atrial pressure of 3 mmHg.    Cardiac catheterization 10/27/2020 Prox LAD to Mid LAD lesion is  90% stenosed. Mid LAD lesion is 90% stenosed. 1st Diag lesion is 40% stenosed. Mid Cx to Dist Cx lesion is 100% stenosed. RV Branch lesion is 90% stenosed. 2nd RPL lesion is 70% stenosed. The left ventricular systolic function is normal. LV end diastolic pressure is normal. The left ventricular ejection fraction is 50-55% by visual estimate. Post intervention, there is a 40% residual stenosis. Balloon angioplasty was performed using a BALLOON SAPPHIRE 2.5X12.   1.  Severe 2 vessel obstructive CAD.    There is severe diffuse segmental disease in the proximal to mid LAD 90%    100% occlusion of the LCx distally with thrombus 2. EF 50-55% with mid inferior HK 3. Normal LV EDP 4. Successful PCI of the distal LCx with POBA.   Plan: will resume IV heparin in 8 hours. Continue IV Aggrastat. Initiate beta blocker and high dose statin. Continue IV Ntg. Will consult CT surgery for CABG this admission given severe LAD disease. The LAD and OM 2 and 3 appear to be acceptable targets.  Diagnostic Dominance: Right    Intervention      Assessment & Plan   1.  Coronary artery disease-status post CABG x3 10/03/2020.  Had episodes of PEA arrest prior to CABG.  Tolerated surgery well and continues to recover well.  He is slowly increasing his physical activity.  He has maintained surgical sternal precautions. Continue aspirin, Plavix, atorvastatin, metoprolol, lisinopril Heart healthy low-sodium diet-salty 6 given Increase physical activity as tolerated  Hyperlipidemia-10/01/2020: Cholesterol 151; HDL 37; LDL Cholesterol 102; Triglycerides 60; VLDL 12 Continue atorvastatin Heart healthy low-sodium high-fiber diet Increase physical activity as tolerated  Essential hypertension-BP today 102/78.  Well-controlled at home. Continue lisinopril, metoprolol Heart healthy low-sodium diet-salty 6 given Increase physical activity as tolerated  Snoring/oxygen desaturation-noted postoperative nighttime  desaturations.  Reports daytime somnolence. Continue weight loss clinic Sleep hygiene information given Recommend sleep study in the future.  Disposition: Follow-up with Dr. Martinique in 3 months.  Jossie Ng. Tais Koestner NP-C    10/27/2020, 9:50 AM [place] [address] Office [phone] Fax [phone]  Notice: This dictation was prepared with Dragon dictation along with smaller phrase technology. Any transcriptional errors that result from this process are unintentional and may not be corrected upon review.  I spent 15 minutes examining this patient, reviewing medications, and using patient centered shared decision making involving her cardiac care.  Prior to her visit I spent greater than 20 minutes reviewing her past medical history,  medications, and prior cardiac tests.

## 2020-10-26 ENCOUNTER — Other Ambulatory Visit: Payer: Self-pay | Admitting: Thoracic Surgery (Cardiothoracic Vascular Surgery)

## 2020-10-26 DIAGNOSIS — Z951 Presence of aortocoronary bypass graft: Secondary | ICD-10-CM

## 2020-10-27 ENCOUNTER — Other Ambulatory Visit: Payer: Self-pay

## 2020-10-27 ENCOUNTER — Ambulatory Visit (INDEPENDENT_AMBULATORY_CARE_PROVIDER_SITE_OTHER): Payer: 59 | Admitting: General Practice

## 2020-10-27 ENCOUNTER — Encounter: Payer: Self-pay | Admitting: General Practice

## 2020-10-27 VITALS — BP 102/78 | HR 83 | Ht 68.0 in | Wt 238.0 lb

## 2020-10-27 DIAGNOSIS — R0902 Hypoxemia: Secondary | ICD-10-CM

## 2020-10-27 DIAGNOSIS — I10 Essential (primary) hypertension: Secondary | ICD-10-CM | POA: Diagnosis not present

## 2020-10-27 DIAGNOSIS — E782 Mixed hyperlipidemia: Secondary | ICD-10-CM | POA: Diagnosis not present

## 2020-10-27 DIAGNOSIS — Z951 Presence of aortocoronary bypass graft: Secondary | ICD-10-CM | POA: Diagnosis not present

## 2020-10-27 DIAGNOSIS — R0683 Snoring: Secondary | ICD-10-CM

## 2020-10-27 NOTE — Patient Instructions (Addendum)
Medication Instructions:  The current medical regimen is effective;  continue present plan and medications as directed. Please refer to the Current Medication list given to you today.  *If you need a refill on your cardiac medications before your next appointment, please call your pharmacy*  Lab Work:   Testing/Procedures:  NONE    NONE  Special Instructions PLEASE READ AND FOLLOW SALTY 6-ATTACHED-1,800mg  daily  PLEASE INCREASE PHYSICAL ACTIVITY AS TOLERATED-CONTINUE STERNAL PRECAUTIONS  PLEASE READ AND FOLLOW SLEEP TIPS-ATTACHED  Follow-Up: Your next appointment:  3 month(s) In Person with Keith Martinique, MD   At [facility], you and your health needs are our priority.  As part of our continuing mission to provide you with exceptional heart care, we have created designated Provider Care Teams.  These Care Teams include your primary Cardiologist (physician) and Advanced Practice Providers (APPs -  Physician Assistants and Nurse Practitioners) who all work together to provide you with the care you need, when you need it.            6 SALTY THINGS TO AVOID     1,800MG  DAILY             Mindfulness-Based Stress Reduction Mindfulness-based stress reduction (MBSR) is a program that helps people learn to practice mindfulness. Mindfulness is the practice of intentionally paying attention to the present moment. MBSR focuses on developing self-awareness, which allows you to respond to life stress without judgment or negative emotions. It can be learned and practiced through techniques such as education, breathing exercises, meditation, and yoga. MBSR includes several mindfulnesstechniques in one program. MBSR works best when you understand the treatment, are willing to try new things, and can commit to spending time practicing what you learn. MBSR training may include learning about: How your emotions, thoughts, and reactions affect your body. New ways to respond to things that cause  negative thoughts to start (triggers). How to notice your thoughts and let go of them. Practicing awareness of everyday things that you normally do without thinking. The techniques and goals of different types of meditation. What are the benefits of MBSR? MBSR can have many benefits, which include helping you to: Develop self-awareness. This refers to knowing and understanding yourself. Learn skills and attitudes that help you to participate in your own health care. Learn new ways to care for yourself. Be more accepting about how things are, and let things go. Be less judgmental and approach things with an open mind. Be patient with yourself and trust yourself more. MBSR has also been shown to: Reduce negative emotions, such as depression and anxiety. Improve memory and focus. Change how you sense and approach pain. Boost your body's ability to fight infections. Help you connect better with other people. Improve your sense of well-being. Follow these instructions at home:  Find a local in-person or online MBSR program. Set aside some time regularly for mindfulness practice. Find a mindfulness practice that works best for you. This may include one or more of the following: Meditation. Meditation involves focusing your mind on a certain thought or activity. Breathing awareness exercises. These help you to stay present by focusing on your breath. Body scan. For this practice, you lie down and pay attention to each part of your body from head to toe. You can identify tension and soreness and intentionally relax parts of your body. Yoga. Yoga involves stretching and breathing, and it can improve your ability to move and be flexible. It can also provide an experience of testing  your body's limits, which can help you release stress. Mindful eating. This way of eating involves focusing on the taste, texture, color, and smell of each bite of food. Because this slows down eating and helps you feel  full sooner, it can be an important part of a weight-loss plan. Find a podcast or recording that provides guidance for breathing awareness, body scan, or meditation exercises. You can listen to these any time when you have a free moment to rest without distractions. Follow your treatment plan as told by your health care provider. This may include taking regular medicines and making changes to your diet or lifestyle as recommended. How to practice mindfulness To do a basic awareness exercise: Find a comfortable place to sit. Pay attention to the present moment. Observe your thoughts, feelings, and surroundings just as they are. Avoid placing judgment on yourself, your feelings, or your surroundings. Make note of any judgment that comes up, and let it go. Your mind may wander, and that is okay. Make note of when your thoughts drift, and return your attention to the present moment. To do basic mindfulness meditation: Find a comfortable place to sit. This may include a stable chair or a firm floor cushion. Sit upright with your back straight. Let your arms fall next to your side with your hands resting on your legs. If sitting in a chair, rest your feet flat on the floor. If sitting on a cushion, cross your legs in front of you. Keep your head in a neutral position with your chin dropped slightly. Relax your jaw and rest the tip of your tongue on the roof of your mouth. Drop your gaze to the floor. You can close your eyes if you like. Breathe normally and pay attention to your breath. Feel the air moving in and out of your nose. Feel your belly expanding and relaxing with each breath. Your mind may wander, and that is okay. Make note of when your thoughts drift, and return your attention to your breath. Avoid placing judgment on yourself, your feelings, or your surroundings. Make note of any judgment or feelings that come up, let them go, and bring your attention back to your breath. When you are  ready, lift your gaze or open your eyes. Pay attention to how your body feels after the meditation. Where to find more information You can find more information about MBSR from: Your health care provider. Community-based meditation centers or programs. Programs offered near you. Summary Mindfulness-based stress reduction (MBSR) is a program that teaches you how to intentionally pay attention to the present moment. It is used with other treatments to help you cope better with daily stress, emotions, and pain. MBSR focuses on developing self-awareness, which allows you to respond to life stress without judgment or negative emotions. MBSR programs may involve learning different mindfulness practices, such as breathing exercises, meditation, yoga, body scan, or mindful eating. Find a mindfulness practice that works best for you, and set aside time for it on a regular basis. This information is not intended to replace advice given to you by your health care provider. Make sure you discuss any questions you have with your healthcare provider. Document Revised: 01/01/2020 Document Reviewed: 01/01/2020 Elsevier Patient Education  2022 [employer].

## 2020-10-30 ENCOUNTER — Emergency Department (HOSPITAL_COMMUNITY): Payer: 59

## 2020-10-30 ENCOUNTER — Emergency Department (HOSPITAL_COMMUNITY)
Admission: EM | Admit: 2020-10-30 | Discharge: 2020-10-30 | Disposition: A | Discharge status: Home / Self Care | Payer: 59 | Attending: Emergency Medicine | Admitting: Emergency Medicine

## 2020-10-30 ENCOUNTER — Other Ambulatory Visit: Payer: Self-pay

## 2020-10-30 DIAGNOSIS — Z85828 Personal history of other malignant neoplasm of skin: Secondary | ICD-10-CM | POA: Diagnosis not present

## 2020-10-30 DIAGNOSIS — R319 Hematuria, unspecified: Secondary | ICD-10-CM | POA: Insufficient documentation

## 2020-10-30 DIAGNOSIS — Z79899 Other long term (current) drug therapy: Secondary | ICD-10-CM | POA: Diagnosis not present

## 2020-10-30 DIAGNOSIS — Z7902 Long term (current) use of antithrombotics/antiplatelets: Secondary | ICD-10-CM | POA: Insufficient documentation

## 2020-10-30 DIAGNOSIS — Z8616 Personal history of COVID-19: Secondary | ICD-10-CM | POA: Diagnosis not present

## 2020-10-30 DIAGNOSIS — Z951 Presence of aortocoronary bypass graft: Secondary | ICD-10-CM | POA: Insufficient documentation

## 2020-10-30 LAB — CBC WITH DIFFERENTIAL/PLATELET
Abs Immature Granulocytes: 0.02 10*3/uL (ref 0.00–0.07)
Basophils Absolute: 0 10*3/uL (ref 0.0–0.1)
Basophils Relative: 0 %
Eosinophils Absolute: 0.1 10*3/uL (ref 0.0–0.5)
Eosinophils Relative: 1 %
HCT: 42.8 % (ref 39.0–52.0)
Hemoglobin: 14 g/dL (ref 13.0–17.0)
Immature Granulocytes: 0 %
Lymphocytes Relative: 28 %
Lymphs Abs: 2.6 10*3/uL (ref 0.7–4.0)
MCH: 30.7 pg (ref 26.0–34.0)
MCHC: 32.7 g/dL (ref 30.0–36.0)
MCV: 93.9 fL (ref 80.0–100.0)
Monocytes Absolute: 0.9 10*3/uL (ref 0.1–1.0)
Monocytes Relative: 10 %
Neutro Abs: 5.6 10*3/uL (ref 1.7–7.7)
Neutrophils Relative %: 61 %
Platelets: 319 10*3/uL (ref 150–400)
RBC: 4.56 MIL/uL (ref 4.22–5.81)
RDW: 12.8 % (ref 11.5–15.5)
WBC: 9.2 10*3/uL (ref 4.0–10.5)
nRBC: 0 % (ref 0.0–0.2)

## 2020-10-30 LAB — BASIC METABOLIC PANEL
Anion gap: 10 (ref 5–15)
BUN: 20 mg/dL (ref 6–20)
CO2: 21 mmol/L — ABNORMAL LOW (ref 22–32)
Calcium: 9.4 mg/dL (ref 8.9–10.3)
Chloride: 104 mmol/L (ref 98–111)
Creatinine, Ser: 0.97 mg/dL (ref 0.61–1.24)
GFR, Estimated: >60 mL/min (ref >60)
Glucose, Bld: 99 mg/dL (ref 70–99)
Potassium: 3.7 mmol/L (ref 3.5–5.1)
Sodium: 135 mmol/L (ref 135–145)

## 2020-10-30 LAB — URINALYSIS, MICROSCOPIC (REFLEX): Squamous Epithelial / HPF: NONE SEEN (ref 0–5)

## 2020-10-30 LAB — URINALYSIS, ROUTINE W REFLEX MICROSCOPIC
Bilirubin Urine: NEGATIVE
Glucose, UA: NEGATIVE mg/dL
Ketones, ur: NEGATIVE mg/dL
Leukocytes,Ua: NEGATIVE
Nitrite: NEGATIVE
Protein, ur: NEGATIVE mg/dL
Specific Gravity, Urine: 1.01 (ref 1.005–1.030)
pH: 6 (ref 5.0–8.0)

## 2020-10-30 NOTE — ED Provider Notes (Signed)
Emergency Medicine Provider Triage Evaluation Note  Keith Franklin , a 60 y.o. male  was evaluated in triage.  Pt complains of hematuria x3 days, with decreased urine frequency and decreased amount of urine.  No fevers, chills, abdominal pain, dysuria.  No penile discharge or scrotal pain/swelling.  CABG last month, newly on clopidogrel..  Review of Systems  Positive: Hematuria decreased urine amount, decreased urine frequency Negative: , Fevers, chills, abdominal pain, nausea, vomiting, diarrhea  Physical Exam  BP 118/83 (BP Location: Left Arm)   Pulse 66   Temp 98.5 F (36.9 C) (Oral)   Resp 14   SpO2 100%  Gen:   Awake, no distress   Resp:  Normal effort  MSK:   Moves extremities without difficulty  Other:  Abdomen soft, nondistended, nontender no CVAT  Medical Decision Making  Medically screening exam initiated at 6:39 PM.  Appropriate orders placed.  Huxley Shurley was informed that the remainder of the evaluation will be completed by another provider, this initial triage assessment does not replace that evaluation, and the importance of remaining in the ED until their evaluation is complete.  This chart was dictated using voice recognition software, Dragon. Despite the best efforts of this provider to proofread and correct errors, errors may still occur which can change documentation meaning.    Emeline Darling, PA-C 10/30/20 1845    Arnaldo Natal, MD 10/30/20 2103

## 2020-10-30 NOTE — ED Triage Notes (Signed)
Pt s/p catherization/triple bypass approx 26 days, dc 6/10, suspected hematuria since yesterday. Endorses 4/10 pain during urination, lower urine output for approx 2-3 days. Admits to less water intake for same. Denies abd pain, but states "fleeting" lower L back pain approx 2wks ago, since resolved

## 2020-10-30 NOTE — Discharge Instructions (Addendum)
Return for any problem.  ?

## 2020-10-30 NOTE — ED Provider Notes (Signed)
[facility] EMERGENCY DEPARTMENT Provider Note   CSN: [id number] Arrival date & time: 10/30/20  1757     History Chief Complaint  Patient presents with   Hematuria    Keith Franklin is a 60 y.o. male.  60 year old male with prior medical history as detailed below presents for evaluation.  Patient is presenting with complaint of painless hematuria.  He noted the symptoms 3 days prior.  He denies fever.  He denies nausea or vomiting.  He denies flank pain. He has a history of prior renal colic  He is newly on Plavix after recent bypass.   The history is provided by the patient and medical records.  Hematuria This is a new problem. The current episode started more than 2 days ago. The problem occurs daily. The problem has not changed since onset.Pertinent negatives include no chest pain and no abdominal pain. Nothing aggravates the symptoms. Nothing relieves the symptoms.      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   Acute ST elevation myocardial infarction (STEMI) involving left circumflex coronary artery ([place]) 09/30/2020   Obesity (BMI 30-39.9) 09/30/2020   Hyperglycemia 09/30/2020   STEMI involving left circumflex coronary artery ([place]) 09/30/2020   STEMI involving left circumflex coronary artery ([place]) 09/30/2020    Past Surgical History:  Procedure Laterality Date   CORONARY ARTERY BYPASS GRAFT N/A 10/03/2020   Procedure: CORONARY ARTERY BYPASS GRAFTING (CABG) X THREE, ON PUMP, USING LEFT INTERNAL MAMMARY ARTERY AND RIGHT GREATER SAPHENOUS ENDOSCOPIC VEIN HARVEST CONDUITS;  Surgeon: Melrose Nakayama, MD;  Location: [place];  Service: Open Heart Surgery;  Laterality: N/A;  will harvest left radial artery for conduit [place]   CORONARY/GRAFT ACUTE MI  REVASCULARIZATION N/A 09/30/2020   Procedure: Coronary/Graft Acute MI Revascularization;  Surgeon: Martinique, Finlee Milo M, MD;  Location: [facility];  Service: Cardiovascular;  Laterality: N/A;   LEFT HEART CATH AND CORONARY ANGIOGRAPHY N/A 09/30/2020   Procedure: LEFT HEART CATH AND CORONARY ANGIOGRAPHY;  Surgeon: Martinique, Jaylynn Mcaleer M, MD;  Location: [place] [facility];  Service: Cardiovascular;  Laterality: N/A;   skin cancer removal         Family History  Problem Relation Age of Onset   Cancer Mother    Heart attack Father     Social History   Tobacco Use   Smoking status: Never   Smokeless tobacco: Never  Vaping Use   Vaping Use: Never used  Substance Use Topics   Alcohol use: Yes   Drug use: Never    Home Medications Prior to Admission medications   Medication Sig Start Date End Date Taking? Authorizing Provider  aspirin EC 81 MG EC tablet Take 1 tablet (81 mg total) by mouth daily. Swallow whole. 10/07/20   Gold, Wayne E, PA-C  atorvastatin (LIPITOR) 80 MG tablet Take 1 tablet (80 mg total) by mouth daily. 10/07/20   John Giovanni, PA-C  clopidogrel (PLAVIX) 75 MG tablet Take 1 tablet (75 mg total) by mouth daily. 10/07/20   Gold, Wayne E, PA-C  famotidine (PEPCID) 10 MG tablet Take 10 mg by mouth daily. As Needed    [provider]  lisinopril (ZESTRIL) 2.5 MG tablet Take 1 tablet (2.5 mg total) by mouth daily. 10/07/20  Jadene Pierini E, PA-C  metoprolol tartrate (LOPRESSOR) 25 MG tablet Take 0.5 tablets (12.5 mg total) by mouth 2 (two) times daily. 10/07/20   Gold, Wilder Glade, PA-C  Simethicone (GAS-X PO) Take 2 tablets by mouth daily as needed (bloating).    [provider]  traMADol (ULTRAM) 50 MG tablet Take 1 tablet (50 mg total) by mouth every 6 (six) hours as needed. 10/18/20   Barrett, Lodema Hong, PA-C    Allergies    Patient has no known allergies.  Review of Systems   Review of Systems  Cardiovascular:  Negative for chest pain.  Gastrointestinal:  Negative  for abdominal pain.  Genitourinary:  Positive for hematuria.  All other systems reviewed and are negative.  Physical Exam Updated Vital Signs BP 119/73   Pulse 65   Temp 98.5 F (36.9 C) (Oral)   Resp 14   Ht 5\' 8"  (1.727 m)   Wt 108 kg   SpO2 99%   BMI 36.20 kg/m   Physical Exam Vitals and nursing note reviewed.  Constitutional:      General: He is not in acute distress.    Appearance: Normal appearance. He is well-developed.  HENT:     Head: Normocephalic and atraumatic.  Eyes:     Conjunctiva/sclera: Conjunctivae normal.     Pupils: Pupils are equal, round, and reactive to light.  Cardiovascular:     Rate and Rhythm: Normal rate and regular rhythm.     Heart sounds: Normal heart sounds.  Pulmonary:     Effort: Pulmonary effort is normal. No respiratory distress.     Breath sounds: Normal breath sounds.  Abdominal:     General: There is no distension.     Palpations: Abdomen is soft.     Tenderness: There is no abdominal tenderness.  Musculoskeletal:        General: No deformity. Normal range of motion.     Cervical back: Normal range of motion and neck supple.  Skin:    General: Skin is warm and dry.  Neurological:     General: No focal deficit present.     Mental Status: He is alert and oriented to person, place, and time.    ED Results / Procedures / Treatments   Labs (all labs ordered are listed, but only abnormal results are displayed) Labs Reviewed  BASIC METABOLIC PANEL - Abnormal; Notable for the following components:      Result Value   CO2 21 (*)    All other components within normal limits  URINALYSIS, ROUTINE W REFLEX MICROSCOPIC - Abnormal; Notable for the following components:   Hgb urine dipstick SMALL (*)    All other components within normal limits  URINALYSIS, MICROSCOPIC (REFLEX) - Abnormal; Notable for the following components:   Bacteria, UA RARE (*)    All other components within normal limits  URINE CULTURE  CBC WITH  DIFFERENTIAL/PLATELET    EKG None  Radiology CT Renal Stone Study  Result Date: 10/30/2020 CLINICAL DATA:  Hematuria, left flank pain EXAM: CT ABDOMEN AND PELVIS WITHOUT CONTRAST TECHNIQUE: Multidetector CT imaging of the abdomen and pelvis was performed following the standard protocol without IV contrast. COMPARISON:  10/25/2015 FINDINGS: Lower chest: Trace left pleural effusion. Hepatobiliary: Two hepatic cysts measuring up to 13 mm (series 3/image 9). Gallbladder is unremarkable. No intrahepatic or extrahepatic ductal dilatation. Pancreas: Within normal limits. Spleen: Within normal limits. Adrenals/Urinary Tract: Adrenal glands are within normal limits. Kidneys are within normal limits. No renal, ureteral, or  bladder calculi. No hydronephrosis. Bladder is within normal limits. Stomach/Bowel: Stomach is within normal limits. No evidence of bowel obstruction. Normal appendix (series 3/image 56). Vascular/Lymphatic: No evidence of abdominal aortic aneurysm. Mild atherosclerotic calcifications of the bilateral iliac arteries. No suspicious abdominopelvic lymphadenopathy. Reproductive: Prostate is unremarkable. Other: No abdominopelvic ascites. Musculoskeletal: Mild degenerative changes of the visualized thoracolumbar spine. Median sternotomy, incompletely visualized. IMPRESSION: No renal, ureteral, or bladder calculi.  No hydronephrosis. No CT findings to account for the patient's left flank pain or hematuria. Electronically Signed   By: Julian Hy M.D.   On: 10/30/2020 20:28    Procedures Procedures   Medications Ordered in ED Medications - No data to display  ED Course  I have reviewed the triage vital signs and the nursing notes.  Pertinent labs & imaging results that were available during my care of the patient were reviewed by me and considered in my medical decision making (see chart for details).    MDM Rules/Calculators/A&P                          MDM  MSE  complete  Keith Franklin was evaluated in Emergency Department on 10/30/2020 for the symptoms described in the history of present illness. He was evaluated in the context of the global COVID-19 pandemic, which necessitated consideration that the patient might be at risk for infection with the SARS-CoV-2 virus that causes COVID-19. Institutional protocols and algorithms that pertain to the evaluation of patients at risk for COVID-19 are in a state of rapid change based on information released by regulatory bodies including the CDC and federal and state organizations. These policies and algorithms were followed during the patient's care in the ED.  Patient is presenting with complaint of painless hematuria.  Patient without evidence of significant acute pathology found on ED work-up.  No evidence of infection found.  Patient without significant abnormality on CT imaging.  Patient without significant blood in UA obtained today.  Urine culture is pending.  Suspect hematuria may be related to recent initiation of Plavix.  Patient feels improved.  He does understand need for close follow-up with Urology.   Final Clinical Impression(s) / ED Diagnoses Final diagnoses:  Hematuria, unspecified type    Rx / DC Orders ED Discharge Orders     None        Valarie Merino, MD 10/30/20 2111

## 2020-11-01 ENCOUNTER — Other Ambulatory Visit: Payer: Self-pay

## 2020-11-01 ENCOUNTER — Ambulatory Visit
Admission: RE | Admit: 2020-11-01 | Discharge: 2020-11-01 | Disposition: A | Discharge status: Home / Self Care | Payer: 59 | Source: Ambulatory Visit | Attending: Thoracic Surgery (Cardiothoracic Vascular Surgery) | Admitting: Thoracic Surgery (Cardiothoracic Vascular Surgery)

## 2020-11-01 ENCOUNTER — Ambulatory Visit (INDEPENDENT_AMBULATORY_CARE_PROVIDER_SITE_OTHER): Payer: Self-pay | Admitting: Physician Assistant

## 2020-11-01 ENCOUNTER — Encounter: Payer: 59 | Admitting: Thoracic Surgery (Cardiothoracic Vascular Surgery)

## 2020-11-01 VITALS — BP 134/82 | HR 100 | Resp 20 | Ht 68.0 in | Wt 237.0 lb

## 2020-11-01 DIAGNOSIS — Z951 Presence of aortocoronary bypass graft: Secondary | ICD-10-CM

## 2020-11-01 LAB — URINE CULTURE: Culture: <10000 — AB

## 2020-11-01 NOTE — Progress Notes (Signed)
[address]       [place] [address]             [phone]    Keith Franklin is a 60 y.o. male patient s/p Coronary artery bypass grafting x 3 about a month ago. He did not have any significant complications during his time in the hospital.  He did notice a small clot in his urine a few days ago and went to the ED for further evaluation.  He has a history of a kidney stone back in 2017 so a UA and a CT renal stone study was performed.  The UA was negative and the urine culture was negative for any significant bacteria.  There was no kidney stone identified on CT.  He has no pain with urination however he does continue to have resistance.  He does not have any prostate issues that he is aware of.   No diagnosis found. Past Medical History:  Diagnosis Date   Cancer ([place]) 2010   skin cancer, Basal Cell Carcinoma    COVID-19 07/2019   GERD (gastroesophageal reflux disease)    No past surgical history pertinent negatives on file. Scheduled Meds: Current Outpatient Medications on File Prior to Visit  Medication Sig Dispense Refill   aspirin EC 81 MG EC tablet Take 1 tablet (81 mg total) by mouth daily. Swallow whole.     atorvastatin (LIPITOR) 80 MG tablet Take 1 tablet (80 mg total) by mouth daily. 30 tablet 1   clopidogrel (PLAVIX) 75 MG tablet Take 1 tablet (75 mg total) by mouth daily. 30 tablet 1   famotidine (PEPCID) 10 MG tablet Take 10 mg by mouth daily. As Needed     lisinopril (ZESTRIL) 2.5 MG tablet Take 1 tablet (2.5 mg total) by mouth daily. 30 tablet 1   metoprolol tartrate (LOPRESSOR) 25 MG tablet Take 0.5 tablets (12.5 mg total) by mouth 2 (two) times daily. 30 tablet 1   Simethicone (GAS-X PO) Take 2 tablets by mouth daily as needed (bloating).     traMADol (ULTRAM) 50 MG tablet Take 1 tablet (50 mg total) by mouth every 6 (six) hours as needed. 28 tablet 0   No current facility-administered medications on file prior to visit.     No Known  Allergies Active Problems:   * No active hospital problems. *  Blood pressure 134/82, pulse 100, resp. rate 20, height 5\' 8"  (1.727 m), weight 237 lb (107.5 kg), SpO2 96 %.    Subjective Objective: Vital signs (most recent): Blood pressure 134/82, pulse 100, resp. rate 20, height 5\' 8"  (1.727 m), weight 237 lb (107.5 kg), SpO2 96 %.  Cor: RRR, no mumur Pulm: CTA bilaterally and in all fields Abd: no tenderness Ext: 1+ non pitting edema in right leg (EVH side) Wound: healing well, clean and dry  Assessment & Plan  Recovering well status post coronary artery bypass grafting x4 about a month ago.  He has been ambulating without any shortness of breath and has been slowly increasing his activity level.  He does have some mid back soreness that started when he was in the hospital.  This seems to be musculoskeletal therefore I recommended heat, ice, and gentle stretches.  If his back pain gets worse not better he is to schedule an appointment with his primary care provider. We discussed cardiopulmonary rehab and starting a program at the 6-week point.  If the referral has not already been sent we will  send 1 today for him to get an evaluation. He is cleared to start part-time work from home.  He is able to only work 2 days a week from home and feels this will be reasonable at this time.  He is not allowed to drive until the 6-week point due to his sternum still healing.  The patient is aware and understands. I reviewed the CT scan from his ER visit few days ago in addition to his UA and urine culture.  I do not think any antibiotics are indicated at this time.  However, if he continues to have symptoms and he has new pain or new frequency he is to call either our office or his primary care for reevaluation. We reviewed his chest x-ray together and he does have a small left pleural effusion but it is not large enough for [name] to treat at this time.  He does want to come back to see Dr. Roxan Hockey to  follow-up on a few of these things in 6 weeks time so I have ordered another chest x-ray to follow-up on this left pleural effusion.  I do believe will resolve on its own.  Plan: The patient is to follow-up with Dr. Koleen Nimrod in 6 weeks for further evaluation of a small left pleural effusion and general progression in the postop period.  Overall, he has been doing quite well and has been progressing appropriately.  If he has any concerns that come up between now and his appointment he is to call our office so that we can see him sooner.  He was asking if he should go ahead and get a calcium score done and I will leave this up to cardiology.  He is on all the appropriate medications as far as we are concerned.  [name] 11/01/2020

## 2020-11-08 ENCOUNTER — Other Ambulatory Visit: Payer: Self-pay | Admitting: Physician Assistant

## 2020-11-08 ENCOUNTER — Telehealth: Payer: Self-pay

## 2020-11-08 MED ORDER — TRAMADOL HCL 50 MG PO TABS: 50.0000 mg | ORAL_TABLET | Freq: Four times a day (QID) | ORAL | 0 refills | Status: DC | PRN

## 2020-11-08 NOTE — Telephone Encounter (Signed)
Patient contacted the office requesting a refill of his pain medication. He is s/p CABG with Dr. Roxan Hockey 10/03/20 and was discharged home from the hospital with Oxycodone 5 mg 10/07/20. He called the office to state that he feels the Oxycodone is too strong and requested something else. He states that he did not finish his Oxycodone. He was prescribed Tramadol 50mg  10/18/20. Patient requesting another Tramadol refill. He states that he is taking about one, once daily, some days two times a day. Pain is in sternum. Advised that this is likely his last refill and that Enid Cutter, [place] would be notified for a refill. Enid Cutter, PA aware and approved refill. Will contact patient and update. [facility] [address] [place], [place].

## 2020-11-08 NOTE — Progress Notes (Unsigned)
Post surgical pain is improving but he is continuing to use tramadol primarily at night. Rx re-ordered for #28 with no refills.

## 2020-11-14 ENCOUNTER — Other Ambulatory Visit: Payer: Self-pay

## 2020-11-14 MED ORDER — TRAMADOL HCL 50 MG PO TABS: 50.0000 mg | ORAL_TABLET | Freq: Four times a day (QID) | ORAL | 0 refills | Status: DC | PRN

## 2020-11-14 NOTE — Progress Notes (Signed)
Patient contacted the office today and stated that he went to pick-up his Tramad[facility]ill [facility] [address]. Stated that he spoke with the pharmacy and they did not have the prescription.   [facility] [address] and spoke with the pharmacist on duty that stated  prescription was never called in and he last prescription for Tramadol was sent on 10/25/2020. Called in refill for Tramadol for patient, with approval from [place], [place] and stated to patient that this would be his last refill. He states that he is only taking about one a day mainly at night and is trying to space them out to every other night.  Called in Tramadol 50 mg tablet one tablet as needed by mouth for pain Q6hrs #28, [facility] [address] in [place]. Patient acknowledged receipt.

## 2020-11-16 ENCOUNTER — Telehealth (HOSPITAL_COMMUNITY): Payer: Self-pay

## 2020-11-21 ENCOUNTER — Telehealth: Payer: Self-pay | Admitting: *Deleted

## 2020-11-21 NOTE — Telephone Encounter (Signed)
Patient contacted the office c/o occasional sharp pains in the abdomen. Patient states he noticed the pain last Wednesday when he was bending over. Patient states pain is in between his bellybutton and sternum. Per patient the pain is about three inches below his chest tube incision. This morning patient states he awoke with what felt like abdominal muscle cramping. Patient states he takes a daily stool softener and is having no issue moving his bowels. States he drinks adequate amounts of water daily. Advised patient to receive care via an urgent care if sensation continues as patient can not get in with a PCP until September. Patient also with questions regarding cardiac rehab. Advised to call cardiac rehab to set up initial appointment if he does not hear from them this week. Patient verbalized understanding.

## 2020-11-22 ENCOUNTER — Telehealth (HOSPITAL_COMMUNITY): Payer: Self-pay | Admitting: *Deleted

## 2020-11-22 NOTE — Telephone Encounter (Signed)
Returned pt call from message left earlier today regarding scheduling for Cardiac rehab.  Spoke with pt and confirmed that he had been cleared after the completion of his follow up appt with the heart surgeon on 7/6.  His name is added to the waitlist according to the follow up and clearance date. Reviewed with him how we contact and schedule pt.  Reviewed with him activity that he can be doing during this interim.  Pt has a treadmill and feels comfortable with this being an option as the temperature and humidity level prevent him from walking outside. Pt also mentioned having some numbess and tingling in his arms and finger with bilateral thumb involvement.  Advised him to contact the surgeons office for advise and ask whether hand therapy would be a benefit. Thanked me for the information. Cherre Huger, BSN Cardiac and Training and development officer

## 2020-11-29 ENCOUNTER — Telehealth: Payer: Self-pay | Admitting: Cardiology

## 2020-11-29 ENCOUNTER — Other Ambulatory Visit: Payer: Self-pay | Admitting: Surgical

## 2020-11-29 MED ORDER — CLOPIDOGREL BISULFATE 75 MG PO TABS: 75.0000 mg | ORAL_TABLET | Freq: Every day | ORAL | 1 refills | Status: DC

## 2020-11-29 MED ORDER — LISINOPRIL 2.5 MG PO TABS: 2.5000 mg | ORAL_TABLET | Freq: Every day | ORAL | 1 refills | Status: DC

## 2020-11-29 MED ORDER — ATORVASTATIN CALCIUM 80 MG PO TABS: 80.0000 mg | ORAL_TABLET | Freq: Every day | ORAL | 1 refills | Status: DC

## 2020-11-29 MED ORDER — METOPROLOL TARTRATE 25 MG PO TABS: 12.5000 mg | ORAL_TABLET | Freq: Two times a day (BID) | ORAL | 1 refills | Status: DC

## 2020-11-29 NOTE — Telephone Encounter (Signed)
*  STAT* If patient is at the pharmacy, call can be transferred to refill team.   1. Which medications need to be refilled? (please list name of each medication and dose if known) atorvastatin (LIPITOR) 80 MG tablet, clopidogrel (PLAVIX) 75 MG tablet,  lisinopril (ZESTRIL) 2.5 MG tablet, metoprolol tartrate (LOPRESSOR) 25 MG tablet  2. Which pharmacy/location (including street and city if local pharmacy) is medication to be sent to[id number] [place] [place]. Do they need a 30 day or 90 day supply? [place]

## 2020-12-01 ENCOUNTER — Encounter (HOSPITAL_COMMUNITY): Payer: Self-pay

## 2020-12-01 ENCOUNTER — Telehealth (HOSPITAL_COMMUNITY): Payer: Self-pay

## 2020-12-01 NOTE — Telephone Encounter (Signed)
Pt returned CR phone call and stated he is interested in CR. Pt will come in for orientation on 12/29/20 @ 10:30AM and will attend the 10:45AM class.

## 2020-12-01 NOTE — Telephone Encounter (Signed)
Attempted to call patient in regards to Cardiac Rehab - LM on VM Mailed letter 

## 2020-12-05 ENCOUNTER — Other Ambulatory Visit: Payer: Self-pay | Admitting: Thoracic Surgery (Cardiothoracic Vascular Surgery)

## 2020-12-05 DIAGNOSIS — Z951 Presence of aortocoronary bypass graft: Secondary | ICD-10-CM

## 2020-12-06 ENCOUNTER — Other Ambulatory Visit: Payer: Self-pay

## 2020-12-06 ENCOUNTER — Ambulatory Visit
Admission: RE | Admit: 2020-12-06 | Discharge: 2020-12-06 | Disposition: A | Discharge status: Home / Self Care | Payer: 59 | Source: Ambulatory Visit | Attending: Thoracic Surgery (Cardiothoracic Vascular Surgery) | Admitting: Thoracic Surgery (Cardiothoracic Vascular Surgery)

## 2020-12-06 ENCOUNTER — Ambulatory Visit (INDEPENDENT_AMBULATORY_CARE_PROVIDER_SITE_OTHER): Payer: Self-pay | Admitting: Physician Assistant

## 2020-12-06 VITALS — BP 145/89 | HR 67 | Resp 20 | Ht 68.0 in | Wt 235.0 lb

## 2020-12-06 DIAGNOSIS — Z951 Presence of aortocoronary bypass graft: Secondary | ICD-10-CM

## 2020-12-06 MED ORDER — LISINOPRIL 5 MG PO TABS: 5.0000 mg | ORAL_TABLET | Freq: Every day | ORAL | 1 refills | Status: DC

## 2020-12-06 NOTE — Progress Notes (Signed)
HPI:  Patient returns for 6 week follow up.  He is S/P CABG x 3 performed on 10/03/2020.  The patient was last evaluated in our office on 11/01/2020.  At that time he was evaluated by Nicholes Rough PA-C and overall he was doing well.  He did have a couple of issues including hematuria which showed negative work up in the ED, a small left pleural effusion, and musculoskeletal back pain.  The patient presents today for follow up on previously mentioned issues.  Overall he is doing well.  He has had no further hematuria, but he continues to have issues with strong voiding urges with slow to start and slow stream.  His musculoskeletal back pain has also improved.  He states he will occasionally get some pain in his shoulder area, but he ultimately thinks this pain was related to moving from bed to the CT scanner.  He has a pain in the upper portion of his abdomen right below his chest tube sites.  This pain has been improving.  Finally he is interested in cardiac rehabilitation, but states he is unable to start until September.  He has therefore found some instructional videos on you tube and has been doing some exercising.  He is ambulating on a treadmill without difficulty.  He recently received steroid injections on his hands and has been on a long steroid taper for this.  Current Outpatient Medications  Medication Sig Dispense Refill   aspirin EC 81 MG EC tablet Take 1 tablet (81 mg total) by mouth daily. Swallow whole.     atorvastatin (LIPITOR) 80 MG tablet Take 1 tablet (80 mg total) by mouth daily. 90 tablet 1   clopidogrel (PLAVIX) 75 MG tablet Take 1 tablet (75 mg total) by mouth daily. 90 tablet 1   famotidine (PEPCID) 10 MG tablet Take 10 mg by mouth daily. As Needed     lisinopril (ZESTRIL) 2.5 MG tablet Take 1 tablet (2.5 mg total) by mouth daily. 90 tablet 1   metoprolol tartrate (LOPRESSOR) 25 MG tablet Take 0.5 tablets (12.5 mg total) by mouth 2 (two) times daily. 180 tablet 1   Simethicone (GAS-X  PO) Take 2 tablets by mouth daily as needed (bloating).     traMADol (ULTRAM) 50 MG tablet Take 1 tablet (50 mg total) by mouth every 6 (six) hours as needed for severe pain. 28 tablet 0   No current facility-administered medications for this visit.    Physical Exam  BP (!) 145/89 (BP Location: Right Arm, Patient Position: Sitting, Cuff Size: Normal)   Pulse 67   Resp 20   Ht '5\' 8"'$  (1.727 m)   Wt 235 lb (106.6 kg)   SpO2 97% Comment: RA  BMI 35.73 kg/m   Gen: No apparent distress, looks great Heart: RRR Lungs: CTA bilaterally Abd: no evidence of hernia on exam Ext: no edema Incisions: well healed, no evidence of infection  Diagnostic Tests:  CXR: resolution of previous pneumothorax   S/P CABG x 3 doing very well.  He is hypertensive at today's visit.  This could be related to current regimen of steroids.  Will monitor and if remains elevated at his next Cardiology visit in September can increase Lisinopril to 5 mg daily BPH- patient describes symptoms of BPH.  He is up multiple times per night attempting to void due to strong urge with minimal response.  Hematuria has resolved.  I have placed a referral to Urology for evaluation Musculoskeletal pain has resolved, upper abdominal  pain suspect minor injury from chest tubes that were placed.. no evidence of hernia on exam Cardiac rehab- okay to start, currently scheduled for September RTC prn   Ellwood Handler, PA-C [facility] [phone]

## 2020-12-06 NOTE — Patient Instructions (Signed)
Will place referral for [facility] on [place], [place].  If you do not hear from them in the next 1-2 weeks contact their office to set up appointment.    You may resume unrestricted physical activity without any particular limitations at this time.  Make every effort to maintain a "heart-healthy" lifestyle with regular physical exercise and adherence to a low-fat, low-carbohydrate diet.  Continue to seek regular follow-up appointments with your primary care physician and/or cardiologist.   You may return to driving an automobile as long as you are no longer requiring oral narcotic pain relievers during the daytime.  It would be wise to start driving only short distances during the daylight and gradually increase from there as you feel comfortable.

## 2020-12-23 ENCOUNTER — Other Ambulatory Visit: Payer: Self-pay

## 2020-12-23 DIAGNOSIS — G8918 Other acute postprocedural pain: Secondary | ICD-10-CM

## 2020-12-23 MED ORDER — TRAMADOL HCL 50 MG PO TABS: 50.0000 mg | ORAL_TABLET | Freq: Four times a day (QID) | ORAL | 0 refills | Status: DC | PRN

## 2020-12-28 ENCOUNTER — Telehealth (HOSPITAL_COMMUNITY): Payer: Self-pay | Admitting: *Deleted

## 2020-12-28 NOTE — Telephone Encounter (Signed)
Called [name] to confirm his CR orientation appointment and to complete his health history. He confirms that he will be coming and I completed his health history. We discussed Covid precautions, mask, proper shoes, directions to the department and our contact number. He voices understanding.

## 2020-12-29 ENCOUNTER — Encounter (HOSPITAL_COMMUNITY)
Admission: RE | Admit: 2020-12-29 | Discharge: 2020-12-29 | Disposition: A | Discharge status: Home / Self Care | Payer: 59 | Source: Ambulatory Visit | Attending: Cardiology | Admitting: Cardiology

## 2020-12-29 ENCOUNTER — Other Ambulatory Visit: Payer: Self-pay

## 2020-12-29 VITALS — BP 112/80 | HR 96 | Ht 67.25 in | Wt 232.6 lb

## 2020-12-29 DIAGNOSIS — Z951 Presence of aortocoronary bypass graft: Secondary | ICD-10-CM

## 2020-12-29 DIAGNOSIS — I213 ST elevation (STEMI) myocardial infarction of unspecified site: Secondary | ICD-10-CM | POA: Insufficient documentation

## 2020-12-29 NOTE — Progress Notes (Signed)
Cardiac Individual Treatment Plan  Patient Details  Name: Keith Franklin MRN: [id number] Date of Birth: 1960/11/01 Referring Provider:   Flowsheet Row CARDIAC REHAB PHASE II ORIENTATION from 12/29/2020 in [facility]  Referring Provider Peter Martinique, MD       Initial Encounter Date:  [place] from 12/29/2020 in [place] [facility]  Date 12/29/20       Visit Diagnosis: ST elevation myocardial infarction (STEMI), unspecified artery ([place])  S/P CABG x 3  Patient's Home Medications on Admission:  Current Outpatient Medications:    aspirin EC 81 MG EC tablet, Take 1 tablet (81 mg total) by mouth daily. Swallow whole., Disp: , Rfl:    atorvastatin (LIPITOR) 80 MG tablet, Take 1 tablet (80 mg total) by mouth daily., Disp: 90 tablet, Rfl: 1   clopidogrel (PLAVIX) 75 MG tablet, Take 1 tablet (75 mg total) by mouth daily., Disp: 90 tablet, Rfl: 1   famotidine (PEPCID) 10 MG tablet, Take 10 mg by mouth daily as needed for heartburn or indigestion., Disp: , Rfl:    lisinopril (ZESTRIL) 5 MG tablet, Take 1 tablet (5 mg total) by mouth daily. (Patient taking differently: Take 2.5 mg by mouth daily.), Disp: 90 tablet, Rfl: 1   metoprolol tartrate (LOPRESSOR) 25 MG tablet, Take 0.5 tablets (12.5 mg total) by mouth 2 (two) times daily., Disp: 180 tablet, Rfl: 1   Simethicone (GAS-X PO), Take 2 tablets by mouth daily as needed (bloating)., Disp: , Rfl:    traMADol (ULTRAM) 50 MG tablet, Take 1 tablet (50 mg total) by mouth every 6 (six) hours as needed for severe pain., Disp: 28 tablet, Rfl: 0  Past Medical History: Past Medical History:  Diagnosis Date   Cancer ([facility]) 2010   skin cancer, Basal Cell Carcinoma    COVID-19 07/2019   GERD (gastroesophageal reflux disease)     Tobacco Use: Social History   Tobacco Use  Smoking Status Never  Smokeless Tobacco Never    Labs: Recent Review Flowsheet  Data     Labs for ITP Cardiac and Pulmonary Rehab Latest Ref Rng & Units 10/03/2020 10/03/2020 10/03/2020 10/03/2020 10/03/2020   Cholestrol 0 - 200 mg/dL - - - - -   LDLCALC 0 - 99 mg/dL - - - - -   HDL >40 mg/dL - - - - -   Trlycerides <150 mg/dL - - - - -   Hemoglobin A1c 4.8 - 5.6 % - - - - -   PHART 7.350 - 7.450 7.171(LL) 7.230(L) 7.313(L) 7.313(L) 7.292(L)   PCO2ART 32.0 - 48.0 mmHg 54.8(H) 52.6(H) 38.3 31.9(L) 32.6   HCO3 20.0 - 28.0 mmol/L 20.3 22.0 19.7(L) 16.1(L) 15.8(L)   TCO2 22 - 32 mmol/L 22 24 21(L) 17(L) 17(L)   ACIDBASEDEF 0.0 - 2.0 mmol/L 9.0(H) 6.0(H) 6.0(H) 9.0(H) 10.0(H)   O2SAT % 92.0 92.0 89.0 94.0 91.0       Capillary Blood Glucose: Lab Results  Component Value Date   GLUCAP 104 (H) 10/07/2020   GLUCAP 121 (H) 10/06/2020   GLUCAP 90 10/06/2020   GLUCAP 129 (H) 10/06/2020   GLUCAP 111 (H) 10/06/2020     Exercise Target Goals: Exercise Program Goal: Individual exercise prescription set using results from initial 6 min walk test and THRR while considering  patient's activity barriers and safety.   Exercise Prescription Goal: Initial exercise prescription builds to 30-45 minutes a day of aerobic activity, 2-3 days per week.  Home exercise guidelines will be given to patient during program as part of exercise prescription that the participant will acknowledge.  Activity Barriers & Risk Stratification:  Activity Barriers & Cardiac Risk Stratification - 12/29/20 1051       Activity Barriers & Cardiac Risk Stratification   Activity Barriers Other (comment)    Comments Trigger thumbs    Cardiac Risk Stratification High             6 Minute Walk:  6 Minute Walk     Row Name 12/29/20 1130         6 Minute Walk   Phase Initial     Distance 1741 feet     Walk Time 6 minutes     # of Rest Breaks 0     MPH 3.3     METS 3.8     RPE 9     Perceived Dyspnea  0     VO2 Peak 13.28     Symptoms No     Resting HR 87 bpm     Resting BP 112/80     Resting  Oxygen Saturation  98 %     Exercise Oxygen Saturation  during 6 min walk 98 %     Max Ex. HR 109 bpm     Max Ex. BP 130/84     2 Minute Post BP 114/76              Oxygen Initial Assessment:   Oxygen Re-Evaluation:   Oxygen Discharge (Final Oxygen Re-Evaluation):   Initial Exercise Prescription:  Initial Exercise Prescription - 12/29/20 1000       Date of Initial Exercise RX and Referring Provider   Date 12/29/20    Referring Provider Peter Martinique, MD    Expected Discharge Date 02/24/21      Treadmill   MPH 3    Grade 1    Minutes 15    METs 3.71      NuStep   Level 3    SPM 85    Minutes 15    METs 2.5      Prescription Details   Frequency (times per week) 3    Duration Progress to 30 minutes of continuous aerobic without signs/symptoms of physical distress      Intensity   THRR 40-80% of Max Heartrate 64-128    Ratings of Perceived Exertion 11-13    Perceived Dyspnea 0-4      Progression   Progression Continue progressive overload as per policy without signs/symptoms or physical distress.      Resistance Training   Training Prescription Yes    Weight 5 lbs    Reps 10-15             Perform Capillary Blood Glucose checks as needed.  Exercise Prescription Changes:   Exercise Comments:   Exercise Goals and Review:   Exercise Goals     Row Name 12/29/20 1051             Exercise Goals   Increase Physical Activity Yes       Intervention Provide advice, education, support and counseling about physical activity/exercise needs.;Develop an individualized exercise prescription for aerobic and resistive training based on initial evaluation findings, risk stratification, comorbidities and participant's personal goals.       Expected Outcomes Short Term: Attend rehab on a regular basis to increase amount of physical activity.;Long Term: Add in home exercise to make exercise part of routine and to increase  amount of physical activity.;Long  Term: Exercising regularly at least 3-5 days a week.       Increase Strength and Stamina Yes       Intervention Provide advice, education, support and counseling about physical activity/exercise needs.;Develop an individualized exercise prescription for aerobic and resistive training based on initial evaluation findings, risk stratification, comorbidities and participant's personal goals.       Expected Outcomes Short Term: Increase workloads from initial exercise prescription for resistance, speed, and METs.;Short Term: Perform resistance training exercises routinely during rehab and add in resistance training at home;Long Term: Improve cardiorespiratory fitness, muscular endurance and strength as measured by increased METs and functional capacity (6MWT)       Able to understand and use rate of perceived exertion (RPE) scale Yes       Intervention Provide education and explanation on how to use RPE scale       Expected Outcomes Short Term: Able to use RPE daily in rehab to express subjective intensity level;Long Term:  Able to use RPE to guide intensity level when exercising independently       Knowledge and understanding of Target Heart Rate Range (THRR) Yes       Intervention Provide education and explanation of THRR including how the numbers were predicted and where they are located for reference       Expected Outcomes Short Term: Able to use daily as guideline for intensity in rehab;Long Term: Able to use THRR to govern intensity when exercising independently       Understanding of Exercise Prescription Yes       Intervention Provide education, explanation, and written materials on patient's individual exercise prescription       Expected Outcomes Short Term: Able to explain program exercise prescription;Long Term: Able to explain home exercise prescription to exercise independently                Exercise Goals Re-Evaluation :   Discharge Exercise Prescription (Final Exercise  Prescription Changes):   Nutrition:  Target Goals: Understanding of nutrition guidelines, daily intake of sodium '1500mg'$ , cholesterol '200mg'$ , calories 30% from fat and 7% or less from saturated fats, daily to have 5 or more servings of fruits and vegetables.  Biometrics:  Pre Biometrics - 12/29/20 1025       Pre Biometrics   Waist Circumference 45.5 inches    Hip Circumference 47.25 inches    Waist to Hip Ratio 0.96 %    Triceps Skinfold 23 mm    % Body Fat 38 %    Grip Strength 37 kg    Flexibility 13 in    Single Leg Stand 30 seconds              Nutrition Therapy Plan and Nutrition Goals:   Nutrition Assessments:  MEDIFICTS Score Key: ?70 Need to make dietary changes  40-70 Heart Healthy Diet ? 40 Therapeutic Level Cholesterol Diet    Picture Your Plate Scores: [id number] Unhealthy dietary pattern with much room for improvement. 41-50 Dietary pattern unlikely to meet recommendations for good health and room for improvement. 51-60 More healthful dietary pattern, with some room for improvement.  >60 Healthy dietary pattern, although there may be some specific behaviors that could be improved.    Nutrition Goals Re-Evaluation:   Nutrition Goals Re-Evaluation:   Nutrition Goals Discharge (Final Nutrition Goals Re-Evaluation):   Psychosocial: Target Goals: Acknowledge presence or absence of significant depression and/or stress, maximize coping skills, provide positive support system. Participant is able  to verbalize types and ability to use techniques and skills needed for reducing stress and depression.  Initial Review & Psychosocial Screening:  Initial Psych Review & Screening - 12/29/20 1200       Initial Review   Current issues with None Identified      Family Dynamics   Good Support System? Yes    Comments Wife very supportivce of his recovery.  Daughter who recently graduated lives with them and is helpful with keeping him on track with his nutrition and  exercise      Barriers   Psychosocial barriers to participate in program There are no identifiable barriers or psychosocial needs.      Screening Interventions   Interventions Encouraged to exercise    Expected Outcomes Long Term goal: The participant improves quality of Life and PHQ9 Scores as seen by post scores and/or verbalization of changes             Quality of Life Scores:  Quality of Life - 12/29/20 1049       Quality of Life   Select Quality of Life      Quality of Life Scores   Health/Function Pre 19.53 %    Socioeconomic Pre 22.5 %    Psych/Spiritual Pre 22.21 %    Family Pre 25.2 %    GLOBAL Pre 21.53 %            Scores of 19 and below usually indicate a poorer quality of life in these areas.  A difference of  2-3 points is a clinically meaningful difference.  A difference of 2-3 points in the total score of the Quality of Life Index has been associated with significant improvement in overall quality of life, self-image, physical symptoms, and general health in studies assessing change in quality of life.  PHQ-9: Recent Review Flowsheet Data     Depression screen [facility] 2/9 12/29/2020   Decreased Interest 0   Down, Depressed, Hopeless 1   PHQ - 2 Score 1   Altered sleeping 1   Tired, decreased energy 1   Change in appetite 0   Feeling bad or failure about yourself  0   Trouble concentrating 0   Moving slowly or fidgety/restless 0   Suicidal thoughts 0   PHQ-9 Score 3   Difficult doing work/chores Not difficult at all      Interpretation of Total Score  Total Score Depression Severity:  1-4 = Minimal depression, 5-9 = Mild depression, 10-14 = Moderate depression, 15-19 = Moderately severe depression, 20-27 = Severe depression   Psychosocial Evaluation and Intervention:   Psychosocial Re-Evaluation:   Psychosocial Discharge (Final Psychosocial Re-Evaluation):   Vocational Rehabilitation: Provide vocational rehab assistance to qualifying  candidates.   Vocational Rehab Evaluation & Intervention:  Vocational Rehab - 12/29/20 1320       Initial Vocational Rehab Evaluation & Intervention   Assessment shows need for Vocational Rehabilitation No             Education: Education Goals: Education classes will be provided on a weekly basis, covering required topics. Participant will state understanding/return demonstration of topics presented.  Learning Barriers/Preferences:  Learning Barriers/Preferences - 12/29/20 1050       Learning Barriers/Preferences   Learning Barriers Sight   wears glasses   Learning Preferences Skilled Demonstration;Individual Instruction;Group Instruction;Video;Computer/Internet             Education Topics: Count Your Pulse:  -Group instruction provided by verbal instruction, demonstration, patient participation and written  materials to support subject.  Instructors address importance of being able to find your pulse and how to count your pulse when at home without a heart monitor.  Patients get hands on experience counting their pulse with staff help and individually.   Heart Attack, Angina, and Risk Factor Modification:  -Group instruction provided by verbal instruction, video, and written materials to support subject.  Instructors address signs and symptoms of angina and heart attacks.    Also discuss risk factors for heart disease and how to make changes to improve heart health risk factors.   Functional Fitness:  -Group instruction provided by verbal instruction, demonstration, patient participation, and written materials to support subject.  Instructors address safety measures for doing things around the house.  Discuss how to get up and down off the floor, how to pick things up properly, how to safely get out of a chair without assistance, and balance training.   Meditation and Mindfulness:  -Group instruction provided by verbal instruction, patient participation, and written  materials to support subject.  Instructor addresses importance of mindfulness and meditation practice to help reduce stress and improve awareness.  Instructor also leads participants through a meditation exercise.    Stretching for Flexibility and Mobility:  -Group instruction provided by verbal instruction, patient participation, and written materials to support subject.  Instructors lead participants through series of stretches that are designed to increase flexibility thus improving mobility.  These stretches are additional exercise for major muscle groups that are typically performed during regular warm up and cool down.   Hands Only CPR:  -Group verbal, video, and participation provides a basic overview of AHA guidelines for community CPR. Role-play of emergencies allow participants the opportunity to practice calling for help and chest compression technique with discussion of AED use.   Hypertension: -Group verbal and written instruction that provides a basic overview of hypertension including the most recent diagnostic guidelines, risk factor reduction with self-care instructions and medication management.    Nutrition I class: Heart Healthy Eating:  -Group instruction provided by PowerPoint slides, verbal discussion, and written materials to support subject matter. The instructor gives an explanation and review of the Therapeutic Lifestyle Changes diet recommendations, which includes a discussion on lipid goals, dietary fat, sodium, fiber, plant stanol/sterol esters, sugar, and the components of a well-balanced, healthy diet.   Nutrition II class: Lifestyle Skills:  -Group instruction provided by PowerPoint slides, verbal discussion, and written materials to support subject matter. The instructor gives an explanation and review of label reading, grocery shopping for heart health, heart healthy recipe modifications, and ways to make healthier choices when eating out.   Diabetes  Question & Answer:  -Group instruction provided by PowerPoint slides, verbal discussion, and written materials to support subject matter. The instructor gives an explanation and review of diabetes co-morbidities, pre- and post-prandial blood glucose goals, pre-exercise blood glucose goals, signs, symptoms, and treatment of hypoglycemia and hyperglycemia, and foot care basics.   Diabetes Blitz:  -Group instruction provided by PowerPoint slides, verbal discussion, and written materials to support subject matter. The instructor gives an explanation and review of the physiology behind type 1 and type 2 diabetes, diabetes medications and rational behind using different medications, pre- and post-prandial blood glucose recommendations and Hemoglobin A1c goals, diabetes diet, and exercise including blood glucose guidelines for exercising safely.    Portion Distortion:  -Group instruction provided by PowerPoint slides, verbal discussion, written materials, and food models to support subject matter. The instructor gives an explanation of  serving size versus portion size, changes in portions sizes over the last 20 years, and what consists of a serving from each food group.   Stress Management:  -Group instruction provided by verbal instruction, video, and written materials to support subject matter.  Instructors review role of stress in heart disease and how to cope with stress positively.     Exercising on Your Own:  -Group instruction provided by verbal instruction, power point, and written materials to support subject.  Instructors discuss benefits of exercise, components of exercise, frequency and intensity of exercise, and end points for exercise.  Also discuss use of nitroglycerin and activating EMS.  Review options of places to exercise outside of rehab.  Review guidelines for sex with heart disease.   Cardiac Drugs I:  -Group instruction provided by verbal instruction and written materials to  support subject.  Instructor reviews cardiac drug classes: antiplatelets, anticoagulants, beta blockers, and statins.  Instructor discusses reasons, side effects, and lifestyle considerations for each drug class.   Cardiac Drugs II:  -Group instruction provided by verbal instruction and written materials to support subject.  Instructor reviews cardiac drug classes: angiotensin converting enzyme inhibitors (ACE-I), angiotensin II receptor blockers (ARBs), nitrates, and calcium channel blockers.  Instructor discusses reasons, side effects, and lifestyle considerations for each drug class.   Anatomy and Physiology of the Circulatory System:  Group verbal and written instruction and models provide basic cardiac anatomy and physiology, with the coronary electrical and arterial systems. Review of: AMI, Angina, Valve disease, Heart Failure, Peripheral Artery Disease, Cardiac Arrhythmia, Pacemakers, and the ICD.   Other Education:  -Group or individual verbal, written, or video instructions that support the educational goals of the cardiac rehab program.   Holiday Eating Survival Tips:  -Group instruction provided by PowerPoint slides, verbal discussion, and written materials to support subject matter. The instructor gives patients tips, tricks, and techniques to help them not only survive but enjoy the holidays despite the onslaught of food that accompanies the holidays.   Knowledge Questionnaire Score:  Knowledge Questionnaire Score - 12/29/20 1049       Knowledge Questionnaire Score   Pre Score 23/24             Core Components/Risk Factors/Patient Goals at Admission:  Personal Goals and Risk Factors at Admission - 12/29/20 1053       Core Components/Risk Factors/Patient Goals on Admission    Weight Management Yes;Obesity;Weight Loss    Intervention Weight Management: Develop a combined nutrition and exercise program designed to reach desired caloric intake, while maintaining  appropriate intake of nutrient and fiber, sodium and fats, and appropriate energy expenditure required for the weight goal.;Weight Management: Provide education and appropriate resources to help participant work on and attain dietary goals.;Weight Management/Obesity: Establish reasonable short term and long term weight goals.;Obesity: Provide education and appropriate resources to help participant work on and attain dietary goals.    Admit Weight 232 lb 9.4 oz (105.5 kg)    Expected Outcomes Short Term: Continue to assess and modify interventions until short term weight is achieved;Long Term: Adherence to nutrition and physical activity/exercise program aimed toward attainment of established weight goal;Weight Maintenance: Understanding of the daily nutrition guidelines, which includes 25-35% calories from fat, 7% or less cal from saturated fats, less than '200mg'$  cholesterol, less than 1.5gm of sodium, & 5 or more servings of fruits and vegetables daily;Weight Loss: Understanding of general recommendations for a balanced deficit meal plan, which promotes 1-2 lb weight loss per week and  includes a negative energy balance of [phone] kcal/d;Understanding recommendations for meals to include 15-35% energy as protein, 25-35% energy from fat, 35-60% energy from carbohydrates, less than '200mg'$  of dietary cholesterol, 20-35 gm of total fiber daily;Understanding of distribution of calorie intake throughout the day with the consumption of 4-5 meals/snacks    Hypertension Yes    Intervention Provide education on lifestyle modifcations including regular physical activity/exercise, weight management, moderate sodium restriction and increased consumption of fresh fruit, vegetables, and low fat dairy, alcohol moderation, and smoking cessation.;Monitor prescription use compliance.    Expected Outcomes Short Term: Continued assessment and intervention until BP is < 140/7m HG in hypertensive participants. < 130/885mHG in  hypertensive participants with diabetes, heart failure or chronic kidney disease.;Long Term: Maintenance of blood pressure at goal levels.    Lipids Yes    Intervention Provide education and support for participant on nutrition & aerobic/resistive exercise along with prescribed medications to achieve LDL '70mg'$ , HDL >'40mg'$ .    Expected Outcomes Short Term: Participant states understanding of desired cholesterol values and is compliant with medications prescribed. Participant is following exercise prescription and nutrition guidelines.;Long Term: Cholesterol controlled with medications as prescribed, with individualized exercise RX and with personalized nutrition plan. Value goals: LDL < '70mg'$ , HDL > 40 mg.             Core Components/Risk Factors/Patient Goals Review:    Core Components/Risk Factors/Patient Goals at Discharge (Final Review):    ITP Comments:   Comments: Patient attended orientation on 12/29/2020 to review rules and guidelines for program.  Completed 6 minute walk test, Intitial ITP, and exercise prescription.  VSS. Telemetry-Sr.  Asymptomatic. Safety measures and social distancing in place per CDC guidelines. CaCherre HugerBSN Cardiac and PuTraining and development officer

## 2020-12-29 NOTE — Progress Notes (Signed)
Cardiac Rehab Medication Review   Does the patient  feel that his/her medications are working for him/her?  yes  Has the patient been experiencing any side effects to the medications prescribed?  no  Does the patient measure his/her own blood pressure or blood glucose at home?  yes   Does the patient have any problems obtaining medications due to transportation or finances?   no  Understanding of regimen: excellent Understanding of indications: excellent Potential of compliance: excellent    Keith Franklin Im RN 12/29/2020 12:08 PM

## 2021-01-04 ENCOUNTER — Other Ambulatory Visit: Payer: Self-pay

## 2021-01-04 ENCOUNTER — Encounter (HOSPITAL_COMMUNITY)
Admission: RE | Admit: 2021-01-04 | Discharge: 2021-01-04 | Disposition: A | Discharge status: Home / Self Care | Payer: 59 | Source: Ambulatory Visit | Attending: Cardiology | Admitting: Cardiology

## 2021-01-04 DIAGNOSIS — Z951 Presence of aortocoronary bypass graft: Secondary | ICD-10-CM | POA: Diagnosis present

## 2021-01-04 DIAGNOSIS — I213 ST elevation (STEMI) myocardial infarction of unspecified site: Secondary | ICD-10-CM | POA: Diagnosis not present

## 2021-01-04 NOTE — Progress Notes (Signed)
Daily Session Note  Patient Details  Name: Colan Laymon MRN: [id number] Date of Birth: [birth year] Referring Provider:   Flowsheet Row CARDIAC REHAB PHASE II ORIENTATION from 12/29/2020 in [place]  Referring Provider Peter Martinique, MD       Encounter Date: 01/04/2021  Check In:  Session Check In - 01/04/21 1104       Check-In   Supervising physician immediately available to respond to emergencies [facility] immediately available    Physician(s) Dr. Florene Glen    Location [facility]    Staff Present Maurice Small, RN, Quentin Ore, MS, ACSM-CEP, Exercise Physiologist;David Makemson, MS, ACSM-CEP, CCRP, Exercise Physiologist;Olinty Celesta Aver, MS, ACSM CEP, Exercise Physiologist;Aleane Wesenberg, RN, BSN    Virtual Visit No    Medication changes reported     No    Fall or balance concerns reported    No    Tobacco Cessation No Change    Warm-up and Cool-down Performed on first and last piece of equipment    Resistance Training Performed No    VAD Patient? No    PAD/SET Patient? No      Pain Assessment   Currently in Pain? No/denies    Pain Score 0-No pain    Multiple Pain Sites No             Capillary Blood Glucose: No results found for this or any previous visit (from the past 24 hour(s)).   Exercise Prescription Changes - 01/04/21 1054       Response to Exercise   Blood Pressure (Admit) 120/68    Blood Pressure (Exercise) 138/72    Blood Pressure (Exit) 102/72    Heart Rate (Admit) 66 bpm    Heart Rate (Exercise) 94 bpm    Heart Rate (Exit) 71 bpm    Rating of Perceived Exertion (Exercise) 12    Symptoms None    Comments Off to a great start with exercise.    Duration Continue with 30 min of aerobic exercise without signs/symptoms of physical distress.    Intensity THRR unchanged      Progression   Progression Continue to progress workloads to maintain intensity without signs/symptoms of physical  distress.    Average METs 2.9      Resistance Training   Training Prescription No      Interval Training   Interval Training No      Treadmill   MPH 3    Grade 1    Minutes 15    METs 3.71      T5 Nustep   Level 3    SPM 85    Minutes 15    METs 2.2             Social History   Tobacco Use  Smoking Status Never  Smokeless Tobacco Never    Goals Met:  Exercise tolerated well No report of concerns or symptoms today  Goals Unmet:  Not Applicable  Comments: Pt started cardiac rehab today.  Pt tolerated light exercise without difficulty. VSS, telemetry-Sinus Rhythm, asymptomatic.  Medication list reconciled. Pt denies barriers to medicaiton compliance.  PSYCHOSOCIAL ASSESSMENT:  PHQ-3. Pt exhibits positive coping skills, hopeful outlook with supportive family. No psychosocial needs identified at this time, no psychosocial interventions. necessary. Will review QOL in the upcoming sessions   Pt enjoys golf and cycling.   Pt oriented to exercise equipment and routine.    Understanding verbalized.    Dr. Fransico Him is Medical Director  for Cardiac Rehab at [facility].

## 2021-01-06 ENCOUNTER — Encounter (HOSPITAL_COMMUNITY)
Admission: RE | Admit: 2021-01-06 | Discharge: 2021-01-06 | Disposition: A | Discharge status: Home / Self Care | Payer: 59 | Source: Ambulatory Visit | Attending: Cardiology | Admitting: Cardiology

## 2021-01-06 ENCOUNTER — Other Ambulatory Visit: Payer: Self-pay

## 2021-01-06 ENCOUNTER — Telehealth: Payer: Self-pay | Admitting: Cardiology

## 2021-01-06 DIAGNOSIS — Z951 Presence of aortocoronary bypass graft: Secondary | ICD-10-CM

## 2021-01-06 DIAGNOSIS — I213 ST elevation (STEMI) myocardial infarction of unspecified site: Secondary | ICD-10-CM | POA: Diagnosis not present

## 2021-01-06 NOTE — Telephone Encounter (Signed)
It would be very helpful to check his BP and HR when he has these symptoms. Would encourage him to get a home BP monitor.  Kamali Nephew Martinique MD, [facility]

## 2021-01-06 NOTE — Progress Notes (Addendum)
Keith Franklin  reports that he feels clammy about an hour after taking his morning medications this has been going on for a while. Keith Franklin reports that he felt clammy for a brief moment while sitting in the check in room prior to starting exercise. Keith Franklin denies symptoms today. Oxygen saturation 98 on room air. Exit BP 118/82 heart rate 73.  Dr Doug Sou office called and notified  Keith Franklin spoke with Lelon Frohlich RN about his symptoms over the phone.Will continue to monitor the patient throughout  the program. Attempted to teach Keith Franklin how to count his pulse. Patient unable to palpate will keep working with the patient during the program. Keith Franklin does have a pulse oximeter will bring next week to check for accuracy for heart rate.Barnet Pall, RN,BSN 01/06/2021 12:30 PM

## 2021-01-06 NOTE — Telephone Encounter (Signed)
Keith Franklin with Cardiac Rehab called to let us talk with the pt re: his meds... I spoke with the pt and he reports that after about an hour after taking his meds he feels clammy, cold hands, and sweaty for about 30 min until it subsides...  This morning he got up, ate wheat toast with peanut butter and had a boiled egg... he then took a shower and then had these symptoms..  Per Keith Franklin at cardiac rehab... he has been doing well with exercise tolerance, his BP 118/82 and HR 70, NSR.   He does not have any issues with blood sugar and unable to check his BP at home but plans to buy a cuff.   I advised him that I will forward to the pharmacist for advise re: his meds.   He will continue to monitor his symptoms.   He denies chest pain, dizziness, and no palpitations.

## 2021-01-06 NOTE — Telephone Encounter (Signed)
Pt advised and will let us know what his readings are after he purchases a BP monitor.

## 2021-01-09 ENCOUNTER — Other Ambulatory Visit: Payer: Self-pay

## 2021-01-09 ENCOUNTER — Encounter (HOSPITAL_COMMUNITY)
Admission: RE | Admit: 2021-01-09 | Discharge: 2021-01-09 | Disposition: A | Discharge status: Home / Self Care | Payer: 59 | Source: Ambulatory Visit | Attending: Cardiology | Admitting: Cardiology

## 2021-01-09 DIAGNOSIS — I213 ST elevation (STEMI) myocardial infarction of unspecified site: Secondary | ICD-10-CM

## 2021-01-09 DIAGNOSIS — Z951 Presence of aortocoronary bypass graft: Secondary | ICD-10-CM

## 2021-01-09 NOTE — Progress Notes (Signed)
Cardiac Individual Treatment Plan  Patient Details  Name: Keith Franklin MRN: [id number] Date of Birth: 15-Aug-1960 Referring Provider:   Flowsheet Row CARDIAC REHAB PHASE II ORIENTATION from 12/29/2020 in [facility]  Referring Provider Peter Martinique, MD       Initial Encounter Date:  [facility] from 12/29/2020 in [place]  Date 12/29/20       Visit Diagnosis: ST elevation myocardial infarction (STEMI), unspecified artery ([place])  S/P CABG x 3  Patient's Home Medications on Admission:  Current Outpatient Medications:    aspirin EC 81 MG EC tablet, Take 1 tablet (81 mg total) by mouth daily. Swallow whole., Disp: , Rfl:    atorvastatin (LIPITOR) 80 MG tablet, Take 1 tablet (80 mg total) by mouth daily., Disp: 90 tablet, Rfl: 1   clopidogrel (PLAVIX) 75 MG tablet, Take 1 tablet (75 mg total) by mouth daily., Disp: 90 tablet, Rfl: 1   famotidine (PEPCID) 10 MG tablet, Take 10 mg by mouth daily as needed for heartburn or indigestion., Disp: , Rfl:    lisinopril (ZESTRIL) 5 MG tablet, Take 1 tablet (5 mg total) by mouth daily. (Patient taking differently: Take 2.5 mg by mouth daily.), Disp: 90 tablet, Rfl: 1   metoprolol tartrate (LOPRESSOR) 25 MG tablet, Take 0.5 tablets (12.5 mg total) by mouth 2 (two) times daily., Disp: 180 tablet, Rfl: 1   Simethicone (GAS-X PO), Take 2 tablets by mouth daily as needed (bloating)., Disp: , Rfl:    traMADol (ULTRAM) 50 MG tablet, Take 1 tablet (50 mg total) by mouth every 6 (six) hours as needed for severe pain., Disp: 28 tablet, Rfl: 0  Past Medical History: Past Medical History:  Diagnosis Date   Cancer ([place]) 2010   skin cancer, Basal Cell Carcinoma    COVID-19 07/2019   GERD (gastroesophageal reflux disease)     Tobacco Use: Social History   Tobacco Use  Smoking Status Never  Smokeless Tobacco Never    Labs: Recent Review Flowsheet  Data     Labs for ITP Cardiac and Pulmonary Rehab Latest Ref Rng & Units 10/03/2020 10/03/2020 10/03/2020 10/03/2020 10/03/2020   Cholestrol 0 - 200 mg/dL - - - - -   LDLCALC 0 - 99 mg/dL - - - - -   HDL >40 mg/dL - - - - -   Trlycerides <150 mg/dL - - - - -   Hemoglobin A1c 4.8 - 5.6 % - - - - -   PHART 7.350 - 7.450 7.171(LL) 7.230(L) 7.313(L) 7.313(L) 7.292(L)   PCO2ART 32.0 - 48.0 mmHg 54.8(H) 52.6(H) 38.3 31.9(L) 32.6   HCO3 20.0 - 28.0 mmol/L 20.3 22.0 19.7(L) 16.1(L) 15.8(L)   TCO2 22 - 32 mmol/L 22 24 21(L) 17(L) 17(L)   ACIDBASEDEF 0.0 - 2.0 mmol/L 9.0(H) 6.0(H) 6.0(H) 9.0(H) 10.0(H)   O2SAT % 92.0 92.0 89.0 94.0 91.0       Capillary Blood Glucose: Lab Results  Component Value Date   GLUCAP 104 (H) 10/07/2020   GLUCAP 121 (H) 10/06/2020   GLUCAP 90 10/06/2020   GLUCAP 129 (H) 10/06/2020   GLUCAP 111 (H) 10/06/2020     Exercise Target Goals: Exercise Program Goal: Individual exercise prescription set using results from initial 6 min walk test and THRR while considering  patient's activity barriers and safety.   Exercise Prescription Goal: Initial exercise prescription builds to 30-45 minutes a day of aerobic activity, 2-3 days per week.  Home exercise guidelines will be given to patient during program as part of exercise prescription that the participant will acknowledge.  Activity Barriers & Risk Stratification:  Activity Barriers & Cardiac Risk Stratification - 12/29/20 1051       Activity Barriers & Cardiac Risk Stratification   Activity Barriers Other (comment)    Comments Trigger thumbs    Cardiac Risk Stratification High             6 Minute Walk:  6 Minute Walk     Row Name 12/29/20 1130         6 Minute Walk   Phase Initial     Distance 1741 feet     Walk Time 6 minutes     # of Rest Breaks 0     MPH 3.3     METS 3.8     RPE 9     Perceived Dyspnea  0     VO2 Peak 13.28     Symptoms No     Resting HR 87 bpm     Resting BP 112/80     Resting  Oxygen Saturation  98 %     Exercise Oxygen Saturation  during 6 min walk 98 %     Max Ex. HR 109 bpm     Max Ex. BP 130/84     2 Minute Post BP 114/76              Oxygen Initial Assessment:   Oxygen Re-Evaluation:   Oxygen Discharge (Final Oxygen Re-Evaluation):   Initial Exercise Prescription:  Initial Exercise Prescription - 12/29/20 1000       Date of Initial Exercise RX and Referring Provider   Date 12/29/20    Referring Provider Peter Martinique, MD    Expected Discharge Date 02/24/21      Treadmill   MPH 3    Grade 1    Minutes 15    METs 3.71      NuStep   Level 3    SPM 85    Minutes 15    METs 2.5      Prescription Details   Frequency (times per week) 3    Duration Progress to 30 minutes of continuous aerobic without signs/symptoms of physical distress      Intensity   THRR 40-80% of Max Heartrate 64-128    Ratings of Perceived Exertion 11-13    Perceived Dyspnea 0-4      Progression   Progression Continue progressive overload as per policy without signs/symptoms or physical distress.      Resistance Training   Training Prescription Yes    Weight 5 lbs    Reps 10-15             Perform Capillary Blood Glucose checks as needed.  Exercise Prescription Changes:   Exercise Prescription Changes     Row Name 01/04/21 1054 01/09/21 1051           Response to Exercise   Blood Pressure (Admit) 120/68 122/80      Blood Pressure (Exercise) 138/72 122/82      Blood Pressure (Exit) 102/72 118/76      Heart Rate (Admit) 66 bpm 84 bpm      Heart Rate (Exercise) 94 bpm 101 bpm      Heart Rate (Exit) 71 bpm 87 bpm      Rating of Perceived Exertion (Exercise) 12 11      Symptoms None None  Comments Off to a great start with exercise. --      Duration Continue with 30 min of aerobic exercise without signs/symptoms of physical distress. Continue with 30 min of aerobic exercise without signs/symptoms of physical distress.      Intensity  THRR unchanged THRR unchanged             Progression   Progression Continue to progress workloads to maintain intensity without signs/symptoms of physical distress. Continue to progress workloads to maintain intensity without signs/symptoms of physical distress.      Average METs 2.9 2.9             Resistance Training   Training Prescription No Yes      Weight -- 5 lbs      Reps -- 10-15             Interval Training   Interval Training No No             Treadmill   MPH 3 3      Grade 1 1      Minutes 15 15      METs 3.71 3.71             T5 Nustep   Level 3 3      SPM 85 85      Minutes 15 15      METs 2.2 2.1               Exercise Comments:   Exercise Comments     Row Name 01/04/21 1146 01/09/21 1140         Exercise Comments Patient tolerated 1st session of exrecise well without symptoms. Reviewed METs and goals with patient.               Exercise Goals and Review:   Exercise Goals     Row Name 12/29/20 1051             Exercise Goals   Increase Physical Activity Yes       Intervention Provide advice, education, support and counseling about physical activity/exercise needs.;Develop an individualized exercise prescription for aerobic and resistive training based on initial evaluation findings, risk stratification, comorbidities and participant's personal goals.       Expected Outcomes Short Term: Attend rehab on a regular basis to increase amount of physical activity.;Long Term: Add in home exercise to make exercise part of routine and to increase amount of physical activity.;Long Term: Exercising regularly at least 3-5 days a week.       Increase Strength and Stamina Yes       Intervention Provide advice, education, support and counseling about physical activity/exercise needs.;Develop an individualized exercise prescription for aerobic and resistive training based on initial evaluation findings, risk stratification, comorbidities and  participant's personal goals.       Expected Outcomes Short Term: Increase workloads from initial exercise prescription for resistance, speed, and METs.;Short Term: Perform resistance training exercises routinely during rehab and add in resistance training at home;Long Term: Improve cardiorespiratory fitness, muscular endurance and strength as measured by increased METs and functional capacity (6MWT)       Able to understand and use rate of perceived exertion (RPE) scale Yes       Intervention Provide education and explanation on how to use RPE scale       Expected Outcomes Short Term: Able to use RPE daily in rehab to express subjective intensity level;Long Term:  Able to use RPE to  guide intensity level when exercising independently       Knowledge and understanding of Target Heart Rate Range (THRR) Yes       Intervention Provide education and explanation of THRR including how the numbers were predicted and where they are located for reference       Expected Outcomes Short Term: Able to use daily as guideline for intensity in rehab;Long Term: Able to use THRR to govern intensity when exercising independently       Understanding of Exercise Prescription Yes       Intervention Provide education, explanation, and written materials on patient's individual exercise prescription       Expected Outcomes Short Term: Able to explain program exercise prescription;Long Term: Able to explain home exercise prescription to exercise independently                Exercise Goals Re-Evaluation :  Exercise Goals Re-Evaluation     Row Name 01/04/21 1146 01/09/21 1140           Exercise Goal Re-Evaluation   Exercise Goals Review Increase Physical Activity;Able to understand and use rate of perceived exertion (RPE) scale Increase Physical Activity;Able to understand and use rate of perceived exertion (RPE) scale      Comments Patient able to understand and use RPE scale appropriately. Patient is tolerating  low to moderate intensity exercise well without symptoms. Patient's goal is to develop an exercise routine, focus on eating a healthy diet, and to lose weight. Patient has a treadmill at home and 5 and 10 pound weights that he plans to use for his home exercise routine.      Expected Outcomes Progress workloads as tolerated to help achieve personal health and fitness goals. Patient will begin and maintain an exercise routine including aerobic and resistance training that he can maintain long term.               Discharge Exercise Prescription (Final Exercise Prescription Changes):  Exercise Prescription Changes - 01/09/21 1051       Response to Exercise   Blood Pressure (Admit) 122/80    Blood Pressure (Exercise) 122/82    Blood Pressure (Exit) 118/76    Heart Rate (Admit) 84 bpm    Heart Rate (Exercise) 101 bpm    Heart Rate (Exit) 87 bpm    Rating of Perceived Exertion (Exercise) 11    Symptoms None    Duration Continue with 30 min of aerobic exercise without signs/symptoms of physical distress.    Intensity THRR unchanged      Progression   Progression Continue to progress workloads to maintain intensity without signs/symptoms of physical distress.    Average METs 2.9      Resistance Training   Training Prescription Yes    Weight 5 lbs    Reps 10-15      Interval Training   Interval Training No      Treadmill   MPH 3    Grade 1    Minutes 15    METs 3.71      T5 Nustep   Level 3    SPM 85    Minutes 15    METs 2.1             Nutrition:  Target Goals: Understanding of nutrition guidelines, daily intake of sodium '1500mg'$ , cholesterol '200mg'$ , calories 30% from fat and 7% or less from saturated fats, daily to have 5 or more servings of fruits and vegetables.  Biometrics:  Pre Biometrics -  12/29/20 1025       Pre Biometrics   Waist Circumference 45.5 inches    Hip Circumference 47.25 inches    Waist to Hip Ratio 0.96 %    Triceps Skinfold 23 mm    %  Body Fat 38 %    Grip Strength 37 kg    Flexibility 13 in    Single Leg Stand 30 seconds              Nutrition Therapy Plan and Nutrition Goals:  Nutrition Therapy & Goals - 01/09/21 1200       Nutrition Therapy   Diet TLC    Drug/Food Interactions Statins/Certain Fruits      Personal Nutrition Goals   Nutrition Goal Pt to identify food quantities necessary to achieve weight loss of 6-12 lb at graduation from cardiac rehab.    Personal Goal #2 Pt to build a healthy plate including vegetables, fruits, whole grains, and low-fat dairy products in a heart healthy meal plan.    Personal Goal #3 Pt to incorporate beans 3-4 times per week      Intervention Plan   Intervention Prescribe, educate and counsel regarding individualized specific dietary modifications aiming towards targeted core components such as weight, hypertension, lipid management, diabetes, heart failure and other comorbidities.;Nutrition handout(s) given to patient.    Expected Outcomes Short Term Goal: A plan has been developed with personal nutrition goals set during dietitian appointment.;Long Term Goal: Adherence to prescribed nutrition plan.             Nutrition Assessments:  MEDIFICTS Score Key: ?70 Need to make dietary changes  40-70 Heart Healthy Diet ? 40 Therapeutic Level Cholesterol Diet   Flowsheet Row CARDIAC REHAB PHASE II EXERCISE from 01/09/2021 in [facility]  Picture Your Plate Total Score on Admission 77      Picture Your Plate Scores: [id number] Unhealthy dietary pattern with much room for improvement. 41-50 Dietary pattern unlikely to meet recommendations for good health and room for improvement. 51-60 More healthful dietary pattern, with some room for improvement.  >60 Healthy dietary pattern, although there may be some specific behaviors that could be improved.    Nutrition Goals Re-Evaluation:  Nutrition Goals Re-Evaluation     [place] Name 01/09/21 1201              Goals   Current Weight 232 lb (105.2 kg)       Nutrition Goal Pt to identify food quantities necessary to achieve weight loss of 6-12 lb at graduation from cardiac rehab.               Personal Goal #2 Re-Evaluation   Personal Goal #2 Pt to build a healthy plate including vegetables, fruits, whole grains, and low-fat dairy products in a heart healthy meal plan.               Personal Goal #3 Re-Evaluation   Personal Goal #3 Pt to incorporate beans 3-4 times per week                Nutrition Goals Re-Evaluation:  Nutrition Goals Re-Evaluation     [place] Name 01/09/21 1201             Goals   Current Weight 232 lb (105.2 kg)       Nutrition Goal Pt to identify food quantities necessary to achieve weight loss of 6-12 lb at graduation from cardiac rehab.  Personal Goal #2 Re-Evaluation   Personal Goal #2 Pt to build a healthy plate including vegetables, fruits, whole grains, and low-fat dairy products in a heart healthy meal plan.               Personal Goal #3 Re-Evaluation   Personal Goal #3 Pt to incorporate beans 3-4 times per week                Nutrition Goals Discharge (Final Nutrition Goals Re-Evaluation):  Nutrition Goals Re-Evaluation - 01/09/21 1201       Goals   Current Weight 232 lb (105.2 kg)    Nutrition Goal Pt to identify food quantities necessary to achieve weight loss of 6-12 lb at graduation from cardiac rehab.      Personal Goal #2 Re-Evaluation   Personal Goal #2 Pt to build a healthy plate including vegetables, fruits, whole grains, and low-fat dairy products in a heart healthy meal plan.      Personal Goal #3 Re-Evaluation   Personal Goal #3 Pt to incorporate beans 3-4 times per week             Psychosocial: Target Goals: Acknowledge presence or absence of significant depression and/or stress, maximize coping skills, provide positive support system. Participant is able to verbalize types and ability to  use techniques and skills needed for reducing stress and depression.  Initial Review & Psychosocial Screening:  Initial Psych Review & Screening - 12/29/20 1200       Initial Review   Current issues with None Identified      Family Dynamics   Good Support System? Yes    Comments Wife very supportivce of his recovery.  Daughter who recently graduated lives with them and is helpful with keeping him on track with his nutrition and exercise      Barriers   Psychosocial barriers to participate in program There are no identifiable barriers or psychosocial needs.      Screening Interventions   Interventions Encouraged to exercise    Expected Outcomes Long Term goal: The participant improves quality of Life and PHQ9 Scores as seen by post scores and/or verbalization of changes             Quality of Life Scores:  Quality of Life - 12/29/20 1049       Quality of Life   Select Quality of Life      Quality of Life Scores   Health/Function Pre 19.53 %    Socioeconomic Pre 22.5 %    Psych/Spiritual Pre 22.21 %    Family Pre 25.2 %    GLOBAL Pre 21.53 %            Scores of 19 and below usually indicate a poorer quality of life in these areas.  A difference of  2-3 points is a clinically meaningful difference.  A difference of 2-3 points in the total score of the Quality of Life Index has been associated with significant improvement in overall quality of life, self-image, physical symptoms, and general health in studies assessing change in quality of life.  PHQ-9: Recent Review Flowsheet Data     Depression screen [facility] 2/9 12/29/2020   Decreased Interest 0   Down, Depressed, Hopeless 1   PHQ - 2 Score 1   Altered sleeping 1   Tired, decreased energy 1   Change in appetite 0   Feeling bad or failure about yourself  0   Trouble concentrating 0   Moving slowly  or fidgety/restless 0   Suicidal thoughts 0   PHQ-9 Score 3   Difficult doing work/chores Not difficult at all       Interpretation of Total Score  Total Score Depression Severity:  1-4 = Minimal depression, 5-9 = Mild depression, 10-14 = Moderate depression, 15-19 = Moderately severe depression, 20-27 = Severe depression   Psychosocial Evaluation and Intervention:   Psychosocial Re-Evaluation:  Psychosocial Re-Evaluation     [place] Name 01/04/21 1707             Psychosocial Re-Evaluation   Current issues with None Identified       Interventions Encouraged to attend Cardiac Rehabilitation for the exercise       Continue Psychosocial Services  No Follow up required                Psychosocial Discharge (Final Psychosocial Re-Evaluation):  Psychosocial Re-Evaluation - 01/04/21 1707       Psychosocial Re-Evaluation   Current issues with None Identified    Interventions Encouraged to attend Cardiac Rehabilitation for the exercise    Continue Psychosocial Services  No Follow up required             Vocational Rehabilitation: Provide vocational rehab assistance to qualifying candidates.   Vocational Rehab Evaluation & Intervention:  Vocational Rehab - 12/29/20 1320       Initial Vocational Rehab Evaluation & Intervention   Assessment shows need for Vocational Rehabilitation No             Education: Education Goals: Education classes will be provided on a weekly basis, covering required topics. Participant will state understanding/return demonstration of topics presented.  Learning Barriers/Preferences:  Learning Barriers/Preferences - 12/29/20 1050       Learning Barriers/Preferences   Learning Barriers Sight   wears glasses   Learning Preferences Skilled Demonstration;Individual Instruction;Group Instruction;Video;Computer/Internet             Education Topics: Count Your Pulse:  -Group instruction provided by verbal instruction, demonstration, patient participation and written materials to support subject.  Instructors address importance of being able to  find your pulse and how to count your pulse when at home without a heart monitor.  Patients get hands on experience counting their pulse with staff help and individually.   Heart Attack, Angina, and Risk Factor Modification:  -Group instruction provided by verbal instruction, video, and written materials to support subject.  Instructors address signs and symptoms of angina and heart attacks.    Also discuss risk factors for heart disease and how to make changes to improve heart health risk factors.   Functional Fitness:  -Group instruction provided by verbal instruction, demonstration, patient participation, and written materials to support subject.  Instructors address safety measures for doing things around the house.  Discuss how to get up and down off the floor, how to pick things up properly, how to safely get out of a chair without assistance, and balance training.   Meditation and Mindfulness:  -Group instruction provided by verbal instruction, patient participation, and written materials to support subject.  Instructor addresses importance of mindfulness and meditation practice to help reduce stress and improve awareness.  Instructor also leads participants through a meditation exercise.    Stretching for Flexibility and Mobility:  -Group instruction provided by verbal instruction, patient participation, and written materials to support subject.  Instructors lead participants through series of stretches that are designed to increase flexibility thus improving mobility.  These stretches are additional exercise for major muscle  groups that are typically performed during regular warm up and cool down.   Hands Only CPR:  -Group verbal, video, and participation provides a basic overview of AHA guidelines for community CPR. Role-play of emergencies allow participants the opportunity to practice calling for help and chest compression technique with discussion of AED  use.   Hypertension: -Group verbal and written instruction that provides a basic overview of hypertension including the most recent diagnostic guidelines, risk factor reduction with self-care instructions and medication management.    Nutrition I class: Heart Healthy Eating:  -Group instruction provided by PowerPoint slides, verbal discussion, and written materials to support subject matter. The instructor gives an explanation and review of the Therapeutic Lifestyle Changes diet recommendations, which includes a discussion on lipid goals, dietary fat, sodium, fiber, plant stanol/sterol esters, sugar, and the components of a well-balanced, healthy diet.   Nutrition II class: Lifestyle Skills:  -Group instruction provided by PowerPoint slides, verbal discussion, and written materials to support subject matter. The instructor gives an explanation and review of label reading, grocery shopping for heart health, heart healthy recipe modifications, and ways to make healthier choices when eating out.   Diabetes Question & Answer:  -Group instruction provided by PowerPoint slides, verbal discussion, and written materials to support subject matter. The instructor gives an explanation and review of diabetes co-morbidities, pre- and post-prandial blood glucose goals, pre-exercise blood glucose goals, signs, symptoms, and treatment of hypoglycemia and hyperglycemia, and foot care basics.   Diabetes Blitz:  -Group instruction provided by PowerPoint slides, verbal discussion, and written materials to support subject matter. The instructor gives an explanation and review of the physiology behind type 1 and type 2 diabetes, diabetes medications and rational behind using different medications, pre- and post-prandial blood glucose recommendations and Hemoglobin A1c goals, diabetes diet, and exercise including blood glucose guidelines for exercising safely.    Portion Distortion:  -Group instruction provided by  PowerPoint slides, verbal discussion, written materials, and food models to support subject matter. The instructor gives an explanation of serving size versus portion size, changes in portions sizes over the last 20 years, and what consists of a serving from each food group.   Stress Management:  -Group instruction provided by verbal instruction, video, and written materials to support subject matter.  Instructors review role of stress in heart disease and how to cope with stress positively.     Exercising on Your Own:  -Group instruction provided by verbal instruction, power point, and written materials to support subject.  Instructors discuss benefits of exercise, components of exercise, frequency and intensity of exercise, and end points for exercise.  Also discuss use of nitroglycerin and activating EMS.  Review options of places to exercise outside of rehab.  Review guidelines for sex with heart disease.   Cardiac Drugs I:  -Group instruction provided by verbal instruction and written materials to support subject.  Instructor reviews cardiac drug classes: antiplatelets, anticoagulants, beta blockers, and statins.  Instructor discusses reasons, side effects, and lifestyle considerations for each drug class.   Cardiac Drugs II:  -Group instruction provided by verbal instruction and written materials to support subject.  Instructor reviews cardiac drug classes: angiotensin converting enzyme inhibitors (ACE-I), angiotensin II receptor blockers (ARBs), nitrates, and calcium channel blockers.  Instructor discusses reasons, side effects, and lifestyle considerations for each drug class.   Anatomy and Physiology of the Circulatory System:  Group verbal and written instruction and models provide basic cardiac anatomy and physiology, with the coronary electrical and arterial  systems. Review of: AMI, Angina, Valve disease, Heart Failure, Peripheral Artery Disease, Cardiac Arrhythmia, Pacemakers, and  the ICD.   Other Education:  -Group or individual verbal, written, or video instructions that support the educational goals of the cardiac rehab program.   Holiday Eating Survival Tips:  -Group instruction provided by PowerPoint slides, verbal discussion, and written materials to support subject matter. The instructor gives patients tips, tricks, and techniques to help them not only survive but enjoy the holidays despite the onslaught of food that accompanies the holidays.   Knowledge Questionnaire Score:  Knowledge Questionnaire Score - 12/29/20 1049       Knowledge Questionnaire Score   Pre Score 23/24             Core Components/Risk Factors/Patient Goals at Admission:  Personal Goals and Risk Factors at Admission - 12/29/20 1053       Core Components/Risk Factors/Patient Goals on Admission    Weight Management Yes;Obesity;Weight Loss    Intervention Weight Management: Develop a combined nutrition and exercise program designed to reach desired caloric intake, while maintaining appropriate intake of nutrient and fiber, sodium and fats, and appropriate energy expenditure required for the weight goal.;Weight Management: Provide education and appropriate resources to help participant work on and attain dietary goals.;Weight Management/Obesity: Establish reasonable short term and long term weight goals.;Obesity: Provide education and appropriate resources to help participant work on and attain dietary goals.    Admit Weight 232 lb 9.4 oz (105.5 kg)    Expected Outcomes Short Term: Continue to assess and modify interventions until short term weight is achieved;Long Term: Adherence to nutrition and physical activity/exercise program aimed toward attainment of established weight goal;Weight Maintenance: Understanding of the daily nutrition guidelines, which includes 25-35% calories from fat, 7% or less cal from saturated fats, less than '200mg'$  cholesterol, less than 1.5gm of sodium, & 5 or  more servings of fruits and vegetables daily;Weight Loss: Understanding of general recommendations for a balanced deficit meal plan, which promotes 1-2 lb weight loss per week and includes a negative energy balance of [phone] kcal/d;Understanding recommendations for meals to include 15-35% energy as protein, 25-35% energy from fat, 35-60% energy from carbohydrates, less than '200mg'$  of dietary cholesterol, 20-35 gm of total fiber daily;Understanding of distribution of calorie intake throughout the day with the consumption of 4-5 meals/snacks    Hypertension Yes    Intervention Provide education on lifestyle modifcations including regular physical activity/exercise, weight management, moderate sodium restriction and increased consumption of fresh fruit, vegetables, and low fat dairy, alcohol moderation, and smoking cessation.;Monitor prescription use compliance.    Expected Outcomes Short Term: Continued assessment and intervention until BP is < 140/30m HG in hypertensive participants. < 130/879mHG in hypertensive participants with diabetes, heart failure or chronic kidney disease.;Long Term: Maintenance of blood pressure at goal levels.    Lipids Yes    Intervention Provide education and support for participant on nutrition & aerobic/resistive exercise along with prescribed medications to achieve LDL '70mg'$ , HDL >'40mg'$ .    Expected Outcomes Short Term: Participant states understanding of desired cholesterol values and is compliant with medications prescribed. Participant is following exercise prescription and nutrition guidelines.;Long Term: Cholesterol controlled with medications as prescribed, with individualized exercise RX and with personalized nutrition plan. Value goals: LDL < '70mg'$ , HDL > 40 mg.             Core Components/Risk Factors/Patient Goals Review:   Goals and Risk Factor Review     Row Name 01/04/21 [id number]  Core Components/Risk Factors/Patient Goals Review   Personal  Goals Review Weight Management/Obesity;Hypertension;Lipids       Review [name] started CR on 01/04/21 and did well with exercise. Vital signs stable.       Expected Outcomes [name] will continue to participate in phase 2 cardiac rehab for exercise, nutrition and lifestyle modifications.                Core Components/Risk Factors/Patient Goals at Discharge (Final Review):   Goals and Risk Factor Review - 01/04/21 1708       Core Components/Risk Factors/Patient Goals Review   Personal Goals Review Weight Management/Obesity;Hypertension;Lipids    Review [name] started CR on 01/04/21 and did well with exercise. Vital signs stable.    Expected Outcomes [name] will continue to participate in phase 2 cardiac rehab for exercise, nutrition and lifestyle modifications.             ITP Comments:  ITP Comments     Row Name 01/04/21 1706           ITP Comments 30 Day ITP Review. [name] started cardiac rehab on 01/04/21 and did well with exercise.                Comments: See ITP Comments

## 2021-01-09 NOTE — Progress Notes (Signed)
Bilal Thiam 60 y.o. male Nutrition Note  Diagnosis: STEMI, CABGx3  Past Medical History:  Diagnosis Date   Cancer [facility]) 2010   skin cancer, Basal Cell Carcinoma    COVID-19 07/2019   GERD (gastroesophageal reflux disease)      Medications reviewed.   Current Outpatient Medications:    aspirin EC 81 MG EC tablet, Take 1 tablet (81 mg total) by mouth daily. Swallow whole., Disp: , Rfl:    atorvastatin (LIPITOR) 80 MG tablet, Take 1 tablet (80 mg total) by mouth daily., Disp: 90 tablet, Rfl: 1   clopidogrel (PLAVIX) 75 MG tablet, Take 1 tablet (75 mg total) by mouth daily., Disp: 90 tablet, Rfl: 1   famotidine (PEPCID) 10 MG tablet, Take 10 mg by mouth daily as needed for heartburn or indigestion., Disp: , Rfl:    lisinopril (ZESTRIL) 5 MG tablet, Take 1 tablet (5 mg total) by mouth daily. (Patient taking differently: Take 2.5 mg by mouth daily.), Disp: 90 tablet, Rfl: 1   metoprolol tartrate (LOPRESSOR) 25 MG tablet, Take 0.5 tablets (12.5 mg total) by mouth 2 (two) times daily., Disp: 180 tablet, Rfl: 1   Simethicone (GAS-X PO), Take 2 tablets by mouth daily as needed (bloating)., Disp: , Rfl:    traMADol (ULTRAM) 50 MG tablet, Take 1 tablet (50 mg total) by mouth every 6 (six) hours as needed for severe pain., Disp: 28 tablet, Rfl: 0   Ht Readings from Last 1 Encounters:  12/29/20 5' 7.25" (1.708 m)     Wt Readings from Last 3 Encounters:  12/29/20 232 lb 9.4 oz (105.5 kg)  12/06/20 235 lb (106.6 kg)  11/01/20 237 lb (107.5 kg)     There is no height or weight on file to calculate BMI.   Social History   Tobacco Use  Smoking Status Never  Smokeless Tobacco Never     Lab Results  Component Value Date   CHOL 151 10/01/2020   Lab Results  Component Value Date   HDL 37 (L) 10/01/2020   Lab Results  Component Value Date   LDLCALC 102 (H) 10/01/2020   Lab Results  Component Value Date   TRIG 60 10/01/2020     Lab Results  Component Value Date   HGBA1C  6.0 (H) 10/02/2020     CBG (last 3)  No results for input(s): GLUCAP in the last 72 hours.   Nutrition Note  Spoke with pt. Nutrition Plan and Nutrition Survey goals reviewed with pt. Pt is following a Heart Healthy diet. Pt wants to lose wt. Pt has been trying to lose wt by avoiding sugary beverages, reducing portions, eating balanced meals. Wt loss tips reviewed (label reading, how to build a healthy plate, portion sizes, eating frequently across the day).  Pt has Pre-diabetes. Last A1c indicates blood glucose well-controlled. Reviewed diabetes prevention with pt.   Per discussion, pt does not use canned/convenience foods often. Pt does not add salt to food. Pt does not eat out frequently.   LDL is 102 mg/dl. Pt has reduced saturated fat intake by avoiding red/processed meats most of the time. He does not use butter. Pt eats beans about 1-2 times per weeks. He typically chooses whole grain bread. Recommended choosing beans more often for a boost in soluble fiber intake. Pt does not take plant stanols or sterols.   Pt expressed understanding of the information reviewed.    Nutrition Diagnosis Food-and nutrition-related knowledge deficit related to lack of exposure to information as related to  diagnosis of: ? CVD ? Pre-diabetes  Nutrition Intervention Pt's individual nutrition plan reviewed with pt. Benefits of adopting Heart Healthy diet discussed when Picture Your Plate reviewed.   Pt given handouts for: ? Nutrition I class ? Nutrition II class ? Continue client-centered nutrition education by RD, as part of interdisciplinary care.  Goal(s) Pt to identify food quantities necessary to achieve weight loss of 6-12 lb at graduation from cardiac rehab.  Pt to build a healthy plate including vegetables, fruits, whole grains, and low-fat dairy products in a heart healthy meal plan. Pt to incorporate beans 3-4 times per week  Plan:  Will provide client-centered nutrition education as part  of interdisciplinary care Monitor and evaluate progress toward nutrition goal with team.   Michaele Offer, MS, RDN, LDN, CDCES

## 2021-01-11 ENCOUNTER — Encounter (HOSPITAL_COMMUNITY)
Admission: RE | Admit: 2021-01-11 | Discharge: 2021-01-11 | Disposition: A | Discharge status: Home / Self Care | Payer: 59 | Source: Ambulatory Visit | Attending: Cardiology | Admitting: Cardiology

## 2021-01-11 ENCOUNTER — Other Ambulatory Visit: Payer: Self-pay

## 2021-01-11 DIAGNOSIS — I213 ST elevation (STEMI) myocardial infarction of unspecified site: Secondary | ICD-10-CM | POA: Diagnosis not present

## 2021-01-11 DIAGNOSIS — Z951 Presence of aortocoronary bypass graft: Secondary | ICD-10-CM

## 2021-01-11 NOTE — Telephone Encounter (Signed)
Spoke to patient he understands to stop taking Lisinopril.He will monitor B/P and pulse.He will send readings in 1 week.

## 2021-01-13 ENCOUNTER — Other Ambulatory Visit: Payer: Self-pay

## 2021-01-13 ENCOUNTER — Ambulatory Visit (INDEPENDENT_AMBULATORY_CARE_PROVIDER_SITE_OTHER): Payer: 59 | Admitting: Physician Assistant

## 2021-01-13 ENCOUNTER — Encounter: Payer: Self-pay | Admitting: Physician Assistant

## 2021-01-13 ENCOUNTER — Encounter (HOSPITAL_COMMUNITY)
Admission: RE | Admit: 2021-01-13 | Discharge: 2021-01-13 | Disposition: A | Discharge status: Home / Self Care | Payer: 59 | Source: Ambulatory Visit | Attending: Cardiology | Admitting: Cardiology

## 2021-01-13 VITALS — BP 106/72 | HR 89 | Temp 97.4°F | Ht 67.25 in | Wt 232.5 lb

## 2021-01-13 DIAGNOSIS — Z23 Encounter for immunization: Secondary | ICD-10-CM

## 2021-01-13 DIAGNOSIS — R7303 Prediabetes: Secondary | ICD-10-CM

## 2021-01-13 DIAGNOSIS — I213 ST elevation (STEMI) myocardial infarction of unspecified site: Secondary | ICD-10-CM

## 2021-01-13 DIAGNOSIS — Z1211 Encounter for screening for malignant neoplasm of colon: Secondary | ICD-10-CM | POA: Diagnosis not present

## 2021-01-13 DIAGNOSIS — G4700 Insomnia, unspecified: Secondary | ICD-10-CM | POA: Diagnosis not present

## 2021-01-13 DIAGNOSIS — Z951 Presence of aortocoronary bypass graft: Secondary | ICD-10-CM

## 2021-01-13 DIAGNOSIS — Z125 Encounter for screening for malignant neoplasm of prostate: Secondary | ICD-10-CM

## 2021-01-13 DIAGNOSIS — Z13220 Encounter for screening for lipoid disorders: Secondary | ICD-10-CM

## 2021-01-13 DIAGNOSIS — Z7689 Persons encountering health services in other specified circumstances: Secondary | ICD-10-CM

## 2021-01-13 NOTE — Progress Notes (Addendum)
Keith Franklin resting heart rate was above 100 prior to beginning exercise this morning. Keith Franklin said he drank coffee and only had 2 hours of sleep last night. Keith Franklin admits to still having problems with insomnia since his open heart surgery in June. Keith Franklin says he has tired melatonin and used Advil PM prior to surgery that worked well for him. Now that  he cant take Advil he has tired Tylenol PM with little success. Keith Franklin says that he has no problem going to sleep he has problems staying asleep. Will send message to his primary care provider as he has an appointment this afternoon. Keith Franklin says that he will drink his coffee after cardiac rehab or try decaffeinated coffee instead. Keith Franklin admits to having job stress and is in the process of putting his home up for sale in [place]. Keith Franklin did go over his target . Exit heart rate 89. Blood pressure 108/76. Medication list update as Keith Franklin said that his urologist added doxycycline and Flomax.Barnet Pall, RN,BSN 01/13/2021 2:07 PM

## 2021-01-13 NOTE — Progress Notes (Signed)
New Patient Office Visit  Subjective:  Patient ID: Keith Franklin, male    DOB: [birth year]  Age: 60 y.o. MRN: [id number]  CC:  Chief Complaint  Patient presents with   Insomnia   Referral    Colposcopy   Labs Only    Lipid,PSA, A1c    HPI Keith Franklin presents for new patient establishment. He would like labs and referral to GI for screening colonoscopy.  June 3rd, 2022 - MI; CABG on 10/03/20. He has been doing very well with diet and exercise since this event.  He is still having some issues with insomnia. Prior to MI, he was taking Aleve PM with relief of his insomnia. Takes Melatonin to help him fall asleep. Sleeps on his back, elevated, which helps him to not snore. Occasional gasps for breath. Hospital also noted he might have some sleep apnea as well.   Denies any other symptoms or concerns today.   Past Medical History:  Diagnosis Date   Cancer ([place]) 2010   skin cancer, Basal Cell Carcinoma    COVID-19 07/2019   GERD (gastroesophageal reflux disease)    Myocardial infarction [facility])     Past Surgical History:  Procedure Laterality Date   CORONARY ARTERY BYPASS GRAFT N/A 10/03/2020   Procedure: CORONARY ARTERY BYPASS GRAFTING (CABG) X THREE, ON PUMP, USING LEFT INTERNAL MAMMARY ARTERY AND RIGHT GREATER SAPHENOUS ENDOSCOPIC VEIN HARVEST CONDUITS;  Surgeon: Melrose Nakayama, MD;  Location: [place];  Service: Open Heart Surgery;  Laterality: N/A;  will harvest left radial artery for conduit Swan   CORONARY/GRAFT ACUTE MI REVASCULARIZATION N/A 09/30/2020   Procedure: Coronary/Graft Acute MI Revascularization;  Surgeon: Martinique, Peter M, MD;  Location: [place] [facility];  Service: Cardiovascular;  Laterality: N/A;   LEFT HEART CATH AND CORONARY ANGIOGRAPHY N/A 09/30/2020   Procedure: LEFT HEART CATH AND CORONARY ANGIOGRAPHY;  Surgeon: Martinique, Peter M, MD;  Location: [place] [facility];  Service: Cardiovascular;  Laterality: N/A;   skin cancer removal      Family History   Problem Relation Age of Onset   Cancer Mother    Heart attack Father     Social History   Socioeconomic History   Marital status: Married    Spouse name: Not on file   Number of children: Not on file   Years of education: Not on file   Highest education level: Not on file  Occupational History   Not on file  Tobacco Use   Smoking status: Never   Smokeless tobacco: Never  Vaping Use   Vaping Use: Never used  Substance and Sexual Activity   Alcohol use: Yes    Alcohol/week: 1.0 standard drink    Types: 1 Cans of beer per week   Drug use: Never   Sexual activity: Not on file  Other Topics Concern   Not on file  Social History Narrative   Not on file   Social Determinants of Health   Financial Resource Strain: Not on file  Food Insecurity: Not on file  Transportation Needs: Not on file  Physical Activity: Not on file  Stress: Not on file  Social Connections: Not on file  Intimate Partner Violence: Not on file    ROS Review of Systems REFER TO HPI FOR PERTINENT POSITIVES AND NEGATIVES  Objective:   Today's Vitals: BP 106/72   Pulse 89   Temp (!) 97.4 F (36.3 C)   Ht 5' 7.25" (1.708 m)   Wt 232 lb 8 oz (  105.5 kg)   SpO2 95%   BMI 36.14 kg/m   Physical Exam Vitals and nursing note reviewed.  Constitutional:      General: He is not in acute distress.    Appearance: Normal appearance. He is not toxic-appearing.  HENT:     Head: Normocephalic and atraumatic.     Right Ear: Tympanic membrane, ear canal and external ear normal.     Left Ear: Tympanic membrane, ear canal and external ear normal.     Nose: Nose normal.     Mouth/Throat:     Mouth: Mucous membranes are moist.     Pharynx: Oropharynx is clear.  Eyes:     Extraocular Movements: Extraocular movements intact.     Conjunctiva/sclera: Conjunctivae normal.     Pupils: Pupils are equal, round, and reactive to light.  Cardiovascular:     Rate and Rhythm: Normal rate and regular rhythm.      Pulses: Normal pulses.     Heart sounds: Normal heart sounds.  Pulmonary:     Effort: Pulmonary effort is normal.     Breath sounds: Normal breath sounds.  Chest:     Comments: Vertical scar chest wall s/p CABG  Abdominal:     General: Abdomen is flat. Bowel sounds are normal.     Palpations: Abdomen is soft.     Tenderness: There is no abdominal tenderness.  Musculoskeletal:        General: Normal range of motion.     Cervical back: Normal range of motion and neck supple.  Skin:    General: Skin is warm and dry.  Neurological:     General: No focal deficit present.     Mental Status: He is alert and oriented to person, place, and time.  Psychiatric:        Mood and Affect: Mood normal.        Behavior: Behavior normal.    Assessment & Plan:   Problem List Items Addressed This Visit   None Visit Diagnoses     Need for immunization against influenza    -  Primary   Relevant Orders   Flu Vaccine QUAD 20moIM (Fluarix, Fluzone & Alfiuria Quad PF) (Completed)       Outpatient Encounter Medications as of 01/13/2021  Medication Sig   aspirin EC 81 MG EC tablet Take 1 tablet (81 mg total) by mouth daily. Swallow whole.   atorvastatin (LIPITOR) 80 MG tablet Take 1 tablet (80 mg total) by mouth daily.   clopidogrel (PLAVIX) 75 MG tablet Take 1 tablet (75 mg total) by mouth daily.   doxycycline (VIBRA-TABS) 100 MG tablet Take 100 mg by mouth 2 (two) times daily.   famotidine (PEPCID) 10 MG tablet Take 10 mg by mouth daily as needed for heartburn or indigestion.   metoprolol tartrate (LOPRESSOR) 25 MG tablet Take 0.5 tablets (12.5 mg total) by mouth 2 (two) times daily.   Simethicone (GAS-X PO) Take 2 tablets by mouth daily as needed (bloating).   tamsulosin (FLOMAX) 0.4 MG CAPS capsule Take 0.4 mg by mouth daily.   traMADol (ULTRAM) 50 MG tablet Take 1 tablet (50 mg total) by mouth every 6 (six) hours as needed for severe pain.   No facility-administered encounter medications on  file as of 01/13/2021.   PLAN: -Cont f/up with cardiology -Labs as requested, call with results -Continue great work with heart healthy diet and exercise  -Referral for screening colonoscopy -Referral for sleep study procedure  Follow-up: Return in about  6 months (around 07/13/2021) for fasting labs and recheck .   Sivan Quast M Zira Helinski, PA-C

## 2021-01-13 NOTE — Patient Instructions (Signed)
Good to meet you today! Please schedule fasting labs next week. Keep up great work with your help. Referral for sleep study and colonoscopy.

## 2021-01-14 LAB — LIPID PANEL
Cholesterol: 82 mg/dL (ref <200)
HDL: 34 mg/dL — ABNORMAL LOW (ref >=40)
LDL Cholesterol (Calc): 32 mg/dL (calc)
Non-HDL Cholesterol (Calc): 48 mg/dL (calc) (ref <130)
Total CHOL/HDL Ratio: 2.4 (calc) (ref <5.0)
Triglycerides: 82 mg/dL (ref <150)

## 2021-01-14 LAB — HEMOGLOBIN A1C
Hgb A1c MFr Bld: 5.7 % of total Hgb — ABNORMAL HIGH (ref <5.7)
Mean Plasma Glucose: 117 mg/dL
eAG (mmol/L): 6.5 mmol/L

## 2021-01-14 LAB — PSA: PSA: 0.54 ng/mL (ref <=4.00)

## 2021-01-16 ENCOUNTER — Other Ambulatory Visit: Payer: Self-pay

## 2021-01-16 ENCOUNTER — Encounter (HOSPITAL_COMMUNITY)
Admission: RE | Admit: 2021-01-16 | Discharge: 2021-01-16 | Disposition: A | Discharge status: Home / Self Care | Payer: 59 | Source: Ambulatory Visit | Attending: Cardiology | Admitting: Cardiology

## 2021-01-16 DIAGNOSIS — Z951 Presence of aortocoronary bypass graft: Secondary | ICD-10-CM

## 2021-01-16 DIAGNOSIS — I213 ST elevation (STEMI) myocardial infarction of unspecified site: Secondary | ICD-10-CM

## 2021-01-16 DIAGNOSIS — E7889 Other lipoprotein metabolism disorders: Secondary | ICD-10-CM

## 2021-01-18 ENCOUNTER — Other Ambulatory Visit: Payer: Self-pay

## 2021-01-18 ENCOUNTER — Other Ambulatory Visit: Payer: 59

## 2021-01-18 ENCOUNTER — Encounter (HOSPITAL_COMMUNITY)
Admission: RE | Admit: 2021-01-18 | Discharge: 2021-01-18 | Disposition: A | Discharge status: Home / Self Care | Payer: 59 | Source: Ambulatory Visit | Attending: Cardiology | Admitting: Cardiology

## 2021-01-18 DIAGNOSIS — I213 ST elevation (STEMI) myocardial infarction of unspecified site: Secondary | ICD-10-CM | POA: Diagnosis not present

## 2021-01-18 DIAGNOSIS — Z951 Presence of aortocoronary bypass graft: Secondary | ICD-10-CM

## 2021-01-19 ENCOUNTER — Other Ambulatory Visit (INDEPENDENT_AMBULATORY_CARE_PROVIDER_SITE_OTHER): Payer: 59

## 2021-01-19 DIAGNOSIS — E7889 Other lipoprotein metabolism disorders: Secondary | ICD-10-CM

## 2021-01-19 LAB — LIPID PANEL
Cholesterol: 78 mg/dL (ref 0–200)
HDL: 36.2 mg/dL — ABNORMAL LOW (ref >39.00)
LDL Cholesterol: 33 mg/dL (ref 0–99)
NonHDL: 41.34
Total CHOL/HDL Ratio: 2
Triglycerides: 43 mg/dL (ref 0.0–149.0)
VLDL: 8.6 mg/dL (ref 0.0–40.0)

## 2021-01-20 ENCOUNTER — Encounter (HOSPITAL_COMMUNITY)
Admission: RE | Admit: 2021-01-20 | Discharge: 2021-01-20 | Disposition: A | Discharge status: Home / Self Care | Payer: 59 | Source: Ambulatory Visit | Attending: Cardiology | Admitting: Cardiology

## 2021-01-20 ENCOUNTER — Other Ambulatory Visit: Payer: Self-pay

## 2021-01-20 DIAGNOSIS — I213 ST elevation (STEMI) myocardial infarction of unspecified site: Secondary | ICD-10-CM

## 2021-01-20 DIAGNOSIS — Z951 Presence of aortocoronary bypass graft: Secondary | ICD-10-CM

## 2021-01-20 NOTE — Progress Notes (Signed)
Reviewed home exercise guidelines with patient including endpoints, temperature precautions, target heart rate and rate of perceived exertion. Patient is currently walking on his treadmill at home 3.0 mph for 5 minutes, and 3.5 mph for 15 minutes, 2 days/week as his mode of home exercise. Patient also has 5 lb weights at home and is doing his weight exercises 15 repetitions, 1 day/week. Encouraged patient to increase from 20 minutes to 30 minutes 2 days/week, and patient is amenable to this. Patient has a pulse oximeter that he can use to check his pulse. Patient voices understanding of instructions given.  Sol Passer, MS, ACSM CEP

## 2021-01-23 ENCOUNTER — Other Ambulatory Visit: Payer: Self-pay

## 2021-01-23 ENCOUNTER — Encounter (HOSPITAL_COMMUNITY)
Admission: RE | Admit: 2021-01-23 | Discharge: 2021-01-23 | Disposition: A | Discharge status: Home / Self Care | Payer: 59 | Source: Ambulatory Visit | Attending: Cardiology | Admitting: Cardiology

## 2021-01-23 DIAGNOSIS — Z951 Presence of aortocoronary bypass graft: Secondary | ICD-10-CM

## 2021-01-23 DIAGNOSIS — I213 ST elevation (STEMI) myocardial infarction of unspecified site: Secondary | ICD-10-CM

## 2021-01-23 NOTE — Progress Notes (Signed)
Cardiology Clinic Note   Patient Name: Dionisio Aragones Date of Encounter: 01/24/2021  Primary Care Provider:  Allwardt, Randa Evens, PA-C Primary Cardiologist:  Peter Martinique, MD  Patient Profile    Hassan Blackshire 60 year old male presents to the clinic today for follow-up evaluation of his coronary artery disease status post CABG x3 on 10/03/2020.  Past Medical History    Past Medical History:  Diagnosis Date   Cancer ([place]) 2010   skin cancer, Basal Cell Carcinoma    COVID-19 07/2019   GERD (gastroesophageal reflux disease)    Myocardial infarction ([place]) 09/30/2020   Past Surgical History:  Procedure Laterality Date   CORONARY ARTERY BYPASS GRAFT N/A 10/03/2020   Procedure: CORONARY ARTERY BYPASS GRAFTING (CABG) X THREE, ON PUMP, USING LEFT INTERNAL MAMMARY ARTERY AND RIGHT GREATER SAPHENOUS ENDOSCOPIC VEIN HARVEST CONDUITS;  Surgeon: Melrose Nakayama, MD;  Location: [facility];  Service: Open Heart Surgery;  Laterality: N/A;  will harvest left radial artery for conduit Swan   CORONARY/GRAFT ACUTE MI REVASCULARIZATION N/A 09/30/2020   Procedure: Coronary/Graft Acute MI Revascularization;  Surgeon: Martinique, Peter M, MD;  Location: [facility];  Service: Cardiovascular;  Laterality: N/A;   LEFT HEART CATH AND CORONARY ANGIOGRAPHY N/A 09/30/2020   Procedure: LEFT HEART CATH AND CORONARY ANGIOGRAPHY;  Surgeon: Martinique, Peter M, MD;  Location: [facility];  Service: Cardiovascular;  Laterality: N/A;   skin cancer removal      Allergies  No Known Allergies  History of Present Illness    Bart Ashford is a PMH of hypoglycemia, PEA cardiac arrest, coronary artery disease, hyperlipidemia, and essential hypertension.   He presented to [facility] with chest pain and was ruled in for ST elevated myocardial infarction.  He was taken emergently to the cardiac Cath Lab which showed a totally occluded circumflex.  Dr. Martinique was able to reestablish flow through the  circumflex.  However, he was also noted to have severe LAD diagonal disease with moderate residual circumflex disease.  CABG was recommended.  He remained stable following his left heart catheterization.  He underwent CABG x3 on 10/03/2020.  While his lines were being placed and he was given sedation he had a bradycardic event with pulseless electrical activity cardiac arrest.  CPR was initiated.  It was performed for less than 2 minutes.  He had return of spontaneous circulation and was taken emergently to the operating room.  On arrival to the operating room he was noted to be tachycardic with stable blood pressure.  He received a LIMA-LAD, SVG to first diagonal and third obtuse marginal.  He was transferred to surgical ICU and remained hemodynamically stable.  He was weaned from the ventilator and extubated by 4 PM on the day of surgery.  He progressed well and tolerated his chest tubes and monitoring lines been removed.  His epicardial pacing wires were removed on postop day 3.  He was noted to have occasional monitor desaturations at night.  He was felt to potentially have OSA.  He continued to recover well without signs of infection.  He was discharged home in stable condition with his wife who is a retired Therapist, sports his daughter who is a Psychologist, sport and exercise on 10/07/2020.   He presented to the clinic 10/27/2020 for follow-up evaluation stated he was slowly increasing his physical activity and walking about 3 to 4 minutes/day.  He reported that his father had an MI at age 59.  He received CABG at that time.  He  underwent cardiac catheterization at age 41 and died from CVA.  We reviewed his cholesterol medications.  He reported that he  had intermittent periods of joint/muscle discomfort that lasted for about 48 hours and dissipates.  He had also noticed sleep disturbances.  I recommended sleep hygiene practices.  We discussed the importance of heart healthy low-sodium low-cholesterol diet.  His sternal incision and chest  tube sites are healing well.  We reviewed signs and symptoms of obstructive sleep apnea.  He did not yet have a sleep study scheduled but I recommended this in the near future.  He was given a salty 6 diet sheet, instructed to maintain his sternal precautions and increase his physical activity as tolerated, and follow-up was planned for 3 months.  He presents to the clinic today for follow-up evaluation states he feels well.  He continues to have periods of insomnia.  He is scheduled for sleep study on November 30.  He has been increasing his physical activity at cardiac rehab and is following a strict heart healthy diet.  He has had a good amount of support from his daughter which she appreciates.  We reviewed his current lipid panel.  His LDL has decreased to 33.  He expresses some concern about cancers with literature he is read and PCP recommendations.  We will reduce his atorvastatin to 40 mg and will repeat his fasting lipids in 4 to 6 weeks.  I will have him continue to monitor his blood pressure, continue his diet and physical activity.  We will have him follow-up in 6 months.   Today he denies chest pain, shortness of breath, lower extremity edema, fatigue, palpitations, melena, hematuria, hemoptysis, diaphoresis, weakness, presyncope, syncope, orthopnea, and PND.  Home Medications    Prior to Admission medications   Medication Sig Start Date End Date Taking? Authorizing Provider  aspirin EC 81 MG EC tablet Take 1 tablet (81 mg total) by mouth daily. Swallow whole. 10/07/20   Gold, Wayne E, PA-C  atorvastatin (LIPITOR) 80 MG tablet Take 1 tablet (80 mg total) by mouth daily. 11/29/20   Martinique, Peter M, MD  clopidogrel (PLAVIX) 75 MG tablet Take 1 tablet (75 mg total) by mouth daily. 11/29/20   Martinique, Peter M, MD  doxycycline (VIBRA-TABS) 100 MG tablet Take 100 mg by mouth 2 (two) times daily. 01/10/21   [provider]  famotidine (PEPCID) 10 MG tablet Take 10 mg by mouth daily as needed  for heartburn or indigestion.    [provider]  metoprolol tartrate (LOPRESSOR) 25 MG tablet Take 0.5 tablets (12.5 mg total) by mouth 2 (two) times daily. 11/29/20   Martinique, Peter M, MD  Simethicone (GAS-X PO) Take 2 tablets by mouth daily as needed (bloating).    [provider]  tamsulosin (FLOMAX) 0.4 MG CAPS capsule Take 0.4 mg by mouth daily. 01/10/21   [provider]  traMADol (ULTRAM) 50 MG tablet Take 1 tablet (50 mg total) by mouth every 6 (six) hours as needed for severe pain. 12/23/20   Melrose Nakayama, MD    Family History    Family History  Problem Relation Age of Onset   Cancer Mother    Heart attack Father    He indicated that his mother is deceased. He indicated that his father is deceased.  Social History    Social History   Socioeconomic History   Marital status: Married    Spouse name: Not on file   Number of children: Not on  file   Years of education: Not on file   Highest education level: Not on file  Occupational History   Not on file  Tobacco Use   Smoking status: Never   Smokeless tobacco: Never  Vaping Use   Vaping Use: Never used  Substance and Sexual Activity   Alcohol use: Yes    Alcohol/week: 1.0 standard drink    Types: 1 Cans of beer per week   Drug use: Never   Sexual activity: Not on file  Other Topics Concern   Not on file  Social History Narrative   Not on file   Social Determinants of Health   Financial Resource Strain: Not on file  Food Insecurity: Not on file  Transportation Needs: Not on file  Physical Activity: Not on file  Stress: Not on file  Social Connections: Not on file  Intimate Partner Violence: Not on file     Review of Systems    General:  No chills, fever, night sweats or weight changes.  Cardiovascular:  No chest pain, dyspnea on exertion, edema, orthopnea, palpitations, paroxysmal nocturnal dyspnea. Dermatological: No rash, lesions/masses Respiratory: No cough,  dyspnea Urologic: No hematuria, dysuria Abdominal:   No nausea, vomiting, diarrhea, bright red blood per rectum, melena, or hematemesis Neurologic:  No visual changes, wkns, changes in mental status. All other systems reviewed and are otherwise negative except as noted above.  Physical Exam    VS:  BP 116/82 (BP Location: Left Arm, Patient Position: Sitting, Cuff Size: Normal)   Pulse 70   Ht 5\' 7"  (1.702 m)   Wt 231 lb 9.6 oz (105.1 kg)   SpO2 98%   BMI 36.27 kg/m  , BMI Body mass index is 36.27 kg/m. GEN: Well nourished, well developed, in no acute distress. HEENT: normal. Neck: Supple, no JVD, carotid bruits, or masses. Cardiac: RRR, no murmurs, rubs, or gallops. No clubbing, cyanosis, edema.  Radials/DP/PT 2+ and equal bilaterally.  Respiratory:  Respirations regular and unlabored, clear to auscultation bilaterally. GI: Soft, nontender, nondistended, BS + x 4. MS: no deformity or atrophy. Skin: warm and dry, no rash. Neuro:  Strength and sensation are intact. Psych: Normal affect.  Accessory Clinical Findings    Recent Labs: 10/03/2020: ALT 36 10/04/2020: Magnesium 2.6 10/30/2020: BUN 20; Creatinine, Ser 0.97; Hemoglobin 14.0; Platelets 319; Potassium 3.7; Sodium 135   Recent Lipid Panel    Component Value Date/Time   CHOL 78 01/19/2021 0903   TRIG 43.0 01/19/2021 0903   HDL 36.20 (L) 01/19/2021 0903   CHOLHDL 2 01/19/2021 0903   VLDL 8.6 01/19/2021 0903   LDLCALC 33 01/19/2021 0903   LDLCALC 32 01/13/2021 1516    ECG personally reviewed by me today-none today.  EKG 10/27/2020 normal sinus rhythm possible left atrial enlargement left axis deviation anterior infarct undetermined age 56 bpm- No acute changes   Echocardiogram 10/01/2020 IMPRESSIONS     1. Left ventricular ejection fraction, by estimation, is 45 to 50%. The  left ventricle has mildly decreased function. The left ventricle has no  regional wall motion abnormalities. There is mild concentric left   ventricular hypertrophy. Left ventricular  diastolic parameters are consistent with Grade I diastolic dysfunction  (impaired relaxation). There is moderate hypokinesis of the left  ventricular, mid-apical anterior wall. There is moderate hypokinesis of  the left ventricular, entire inferolateral  wall.   2. Right ventricular systolic function is normal. The right ventricular  size is normal.   3. Left atrial size was mildly dilated.  4. The mitral valve is normal in structure. No evidence of mitral valve  regurgitation. No evidence of mitral stenosis.   5. The aortic valve is tricuspid. Aortic valve regurgitation is not  visualized. No aortic stenosis is present.   6. There is mild dilatation of the ascending aorta, measuring 42 mm.   7. The inferior vena cava is normal in size with greater than 50%  respiratory variability, suggesting right atrial pressure of 3 mmHg.      Cardiac catheterization 10/27/2020 Prox LAD to Mid LAD lesion is 90% stenosed. Mid LAD lesion is 90% stenosed. 1st Diag lesion is 40% stenosed. Mid Cx to Dist Cx lesion is 100% stenosed. RV Branch lesion is 90% stenosed. 2nd RPL lesion is 70% stenosed. The left ventricular systolic function is normal. LV end diastolic pressure is normal. The left ventricular ejection fraction is 50-55% by visual estimate. Post intervention, there is a 40% residual stenosis. Balloon angioplasty was performed using a BALLOON SAPPHIRE 2.5X12.   1. Severe 2 vessel obstructive CAD.    There is severe diffuse segmental disease in the proximal to mid LAD 90%    100% occlusion of the LCx distally with thrombus 2. EF 50-55% with mid inferior HK 3. Normal LV EDP 4. Successful PCI of the distal LCx with POBA.   Plan: will resume IV heparin in 8 hours. Continue IV Aggrastat. Initiate beta blocker and high dose statin. Continue IV Ntg. Will consult CT surgery for CABG this admission given severe LAD disease. The LAD and OM 2 and 3 appear  to be acceptable targets.   Diagnostic Dominance: Right     Intervention        Assessment & Plan   1.   Coronary artery disease-no recent episodes of arm neck back or chest discomfort.  Status post CABG x3 10/03/2020.  Had episodes of PEA arrest prior to CABG.  Tolerated surgery well and continues to recover well.  He has been working with physical therapy regularly several times per week.  Continue aspirin, Plavix, atorvastatin, metoprolol Heart healthy low-sodium diet-salty 6 given Increase physical activity as tolerated Repeat Echo  Hyperlipidemia-01/19/2021: Cholesterol 78; HDL 36.20; LDL Cholesterol 33; Triglycerides 43.0; VLDL 8.6 Reduce  atorvastatin to 40 mg Heart healthy low-sodium high-fiber diet Increase physical activity as tolerated Repeat fasting lipids and LFTs in 4 to 6 weeks  Essential hypertension-BP today 116/82.  Well-controlled at home. Continue  metoprolol Heart healthy low-sodium diet-salty 6 given Increase physical activity as tolerated   Snoring/oxygen desaturation-previously noted postoperative nighttime desaturations.  Continues to report  daytime somnolence. Continue weight loss  Sleep study planned   Disposition: Follow-up with Dr. Martinique in 6 months.   Jossie Ng. Darrie Macmillan NP-C    01/24/2021, 10:17 AM [address] Office [phone] Fax [phone]  Notice: This dictation was prepared with Dragon dictation along with smaller phrase technology. Any transcriptional errors that result from this process are unintentional and may not be corrected upon review.  I spent 15 minutes examining this patient, reviewing medications, and using patient centered shared decision making involving her cardiac care.  Prior to her visit I spent greater than 20 minutes reviewing her past medical history,  medications, and prior cardiac tests.

## 2021-01-24 ENCOUNTER — Ambulatory Visit (INDEPENDENT_AMBULATORY_CARE_PROVIDER_SITE_OTHER): Payer: 59 | Admitting: General Practice

## 2021-01-24 ENCOUNTER — Encounter: Payer: Self-pay | Admitting: General Practice

## 2021-01-24 VITALS — BP 116/82 | HR 70 | Ht 67.0 in | Wt 231.6 lb

## 2021-01-24 DIAGNOSIS — I2510 Atherosclerotic heart disease of native coronary artery without angina pectoris: Secondary | ICD-10-CM

## 2021-01-24 DIAGNOSIS — E782 Mixed hyperlipidemia: Secondary | ICD-10-CM

## 2021-01-24 DIAGNOSIS — Z951 Presence of aortocoronary bypass graft: Secondary | ICD-10-CM

## 2021-01-24 DIAGNOSIS — R0683 Snoring: Secondary | ICD-10-CM

## 2021-01-24 DIAGNOSIS — I2583 Coronary atherosclerosis due to lipid rich plaque: Secondary | ICD-10-CM

## 2021-01-24 DIAGNOSIS — Z79899 Other long term (current) drug therapy: Secondary | ICD-10-CM

## 2021-01-24 MED ORDER — ATORVASTATIN CALCIUM 40 MG PO TABS: 40.0000 mg | ORAL_TABLET | Freq: Every day | ORAL | 6 refills | Status: DC

## 2021-01-24 NOTE — Patient Instructions (Signed)
Medication Instructions:  DECREASE ATORVASTATIN 40MG  DAILY  *If you need a refill on your cardiac medications before your next appointment, please call your pharmacy*  Lab Work: FASTING LIPID AND LFT IN 4-6 WEEKS If you have labs (blood work) drawn today and your tests are completely normal, you will receive your results only by:  [place] (if you have MyChart) OR A paper copy in the mail.  If you have any lab test that is abnormal or we need to change your treatment, we will call you to review the results. You may go to any [facility] that is convenient for you however, we do have a lab in our office that is able to assist you. You DO NOT need an appointment for our lab. The lab is open 8:00am and closes at 4:00pm. Lunch 12:45 - 1:45pm.  Testing/Procedures: Echocardiogram - Your physician has requested that you have an echocardiogram. Echocardiography is a painless test that uses sound waves to create images of your heart. It provides your doctor with information about the size and shape of your heart and how well your heart's chambers and valves are working. This procedure takes approximately one hour. There are no restrictions for this procedure. This will be performed at our [facility] location - [address].  Special Instructions CONTINUE TO TAKE AND LOG YOUR BLOOD PRESSURE  Follow-Up: Your next appointment:  6 month(s) In Person with Keith Martinique, MD   At [facility] you and your health needs are our priority.  As part of our continuing mission to provide you with exceptional heart care, we have created designated Provider Care Teams.  These Care Teams include your primary Cardiologist (physician) and Advanced Practice Providers (APPs -  Physician Assistants and Nurse Practitioners) who all work together to provide you with the care you need, when you need it.

## 2021-01-25 ENCOUNTER — Other Ambulatory Visit: Payer: Self-pay

## 2021-01-25 ENCOUNTER — Encounter (HOSPITAL_COMMUNITY)
Admission: RE | Admit: 2021-01-25 | Discharge: 2021-01-25 | Disposition: A | Discharge status: Home / Self Care | Payer: 59 | Source: Ambulatory Visit | Attending: Cardiology | Admitting: Cardiology

## 2021-01-25 DIAGNOSIS — Z951 Presence of aortocoronary bypass graft: Secondary | ICD-10-CM

## 2021-01-25 DIAGNOSIS — I213 ST elevation (STEMI) myocardial infarction of unspecified site: Secondary | ICD-10-CM | POA: Diagnosis not present

## 2021-01-27 ENCOUNTER — Other Ambulatory Visit: Payer: Self-pay

## 2021-01-27 ENCOUNTER — Encounter (HOSPITAL_COMMUNITY)
Admission: RE | Admit: 2021-01-27 | Discharge: 2021-01-27 | Disposition: A | Discharge status: Home / Self Care | Payer: 59 | Source: Ambulatory Visit | Attending: Cardiology | Admitting: Cardiology

## 2021-01-27 DIAGNOSIS — I213 ST elevation (STEMI) myocardial infarction of unspecified site: Secondary | ICD-10-CM

## 2021-01-27 DIAGNOSIS — Z951 Presence of aortocoronary bypass graft: Secondary | ICD-10-CM

## 2021-01-30 ENCOUNTER — Encounter (HOSPITAL_COMMUNITY)
Admission: RE | Admit: 2021-01-30 | Discharge: 2021-01-30 | Disposition: A | Discharge status: Home / Self Care | Payer: 59 | Source: Ambulatory Visit | Attending: Cardiology | Admitting: Cardiology

## 2021-01-30 ENCOUNTER — Telehealth (HOSPITAL_COMMUNITY): Payer: Self-pay | Admitting: *Deleted

## 2021-01-30 ENCOUNTER — Other Ambulatory Visit: Payer: Self-pay

## 2021-01-30 DIAGNOSIS — I213 ST elevation (STEMI) myocardial infarction of unspecified site: Secondary | ICD-10-CM | POA: Insufficient documentation

## 2021-01-30 DIAGNOSIS — Z951 Presence of aortocoronary bypass graft: Secondary | ICD-10-CM | POA: Diagnosis not present

## 2021-01-30 NOTE — Telephone Encounter (Signed)
-----   Message from Peter M Martinique, MD sent at 01/27/2021  2:24 PM EDT ----- Regarding: RE: Target heart rate increase May increase THR to 40-90% of age predicted max.  Peter Martinique MD, [facility]   ----- Message ----- From: Sol Passer Sent: 01/27/2021   1:54 PM EDT To: Peter M Martinique, MD Subject: Target heart rate increase                     Greetings Dr. Martinique,  [name] has been in rehab approximately 3.5 weeks and is doing well. Blood pressure is within normal limits, but exercise heart rates are beginning to exceed Target Heart Rate Range(THRR) of 64-128 bpm (40%-80% of age predicted max HR).  If no GXT is planned for the near future and MD agrees, request to increase THR to 40% -90% of age predicted max HR 64-144 bpm.    Thank you, Sol Passer, MS, ACSM CEP

## 2021-02-01 ENCOUNTER — Encounter (HOSPITAL_COMMUNITY): Payer: 59

## 2021-02-03 ENCOUNTER — Encounter (HOSPITAL_COMMUNITY): Payer: 59

## 2021-02-06 ENCOUNTER — Other Ambulatory Visit: Payer: Self-pay

## 2021-02-06 ENCOUNTER — Encounter (HOSPITAL_COMMUNITY): Payer: 59

## 2021-02-06 DIAGNOSIS — E7889 Other lipoprotein metabolism disorders: Secondary | ICD-10-CM

## 2021-02-06 NOTE — Progress Notes (Signed)
Cardiac Individual Treatment Plan  Patient Details  Name: Elwin Tsou MRN: [id number] Date of Birth: 02/13/1961 Referring Provider:   Flowsheet Row CARDIAC REHAB PHASE II ORIENTATION from 12/29/2020 in [facility]  Referring Provider Peter Martinique, MD       Initial Encounter Date:  [place] from 12/29/2020 in [facility]  Date 12/29/20       Visit Diagnosis: ST elevation myocardial infarction (STEMI), unspecified artery ([place])  S/P CABG x 3  Patient's Home Medications on Admission:  Current Outpatient Medications:    aspirin EC 81 MG EC tablet, Take 1 tablet (81 mg total) by mouth daily. Swallow whole., Disp: , Rfl:    atorvastatin (LIPITOR) 40 MG tablet, Take 1 tablet (40 mg total) by mouth daily., Disp: 30 tablet, Rfl: 6   clopidogrel (PLAVIX) 75 MG tablet, Take 1 tablet (75 mg total) by mouth daily., Disp: 90 tablet, Rfl: 1   doxycycline (VIBRA-TABS) 100 MG tablet, Take 100 mg by mouth 2 (two) times daily., Disp: , Rfl:    famotidine (PEPCID) 10 MG tablet, Take 10 mg by mouth daily as needed for heartburn or indigestion., Disp: , Rfl:    metoprolol tartrate (LOPRESSOR) 25 MG tablet, Take 0.5 tablets (12.5 mg total) by mouth 2 (two) times daily., Disp: 180 tablet, Rfl: 1   Simethicone (GAS-X PO), Take 2 tablets by mouth daily as needed (bloating)., Disp: , Rfl:    tamsulosin (FLOMAX) 0.4 MG CAPS capsule, Take 0.4 mg by mouth daily., Disp: , Rfl:    traMADol (ULTRAM) 50 MG tablet, Take 1 tablet (50 mg total) by mouth every 6 (six) hours as needed for severe pain., Disp: 28 tablet, Rfl: 0  Past Medical History: Past Medical History:  Diagnosis Date   Cancer ([place]) 2010   skin cancer, Basal Cell Carcinoma    COVID-19 07/2019   GERD (gastroesophageal reflux disease)    Myocardial infarction ([place]) 09/30/2020    Tobacco Use: Social History   Tobacco Use  Smoking Status  Never  Smokeless Tobacco Never    Labs: Recent Review Flowsheet Data     Labs for ITP Cardiac and Pulmonary Rehab Latest Ref Rng & Units 10/03/2020 10/03/2020 10/03/2020 01/13/2021 01/19/2021   Cholestrol 0 - 200 mg/dL - - - 82 78   LDLCALC 0 - 99 mg/dL - - - 32 33   HDL >39.00 mg/dL - - - 34(L) 36.20(L)   Trlycerides 0.0 - 149.0 mg/dL - - - 82 43.0   Hemoglobin A1c <5.7 % of total Hgb - - - 5.7(H) -   PHART 7.350 - 7.450 7.313(L) 7.313(L) 7.292(L) - -   PCO2ART 32.0 - 48.0 mmHg 38.3 31.9(L) 32.6 - -   HCO3 20.0 - 28.0 mmol/L 19.7(L) 16.1(L) 15.8(L) - -   TCO2 22 - 32 mmol/L 21(L) 17(L) 17(L) - -   ACIDBASEDEF 0.0 - 2.0 mmol/L 6.0(H) 9.0(H) 10.0(H) - -   O2SAT % 89.0 94.0 91.0 - -       Capillary Blood Glucose: Lab Results  Component Value Date   GLUCAP 104 (H) 10/07/2020   GLUCAP 121 (H) 10/06/2020   GLUCAP 90 10/06/2020   GLUCAP 129 (H) 10/06/2020   GLUCAP 111 (H) 10/06/2020     Exercise Target Goals: Exercise Program Goal: Individual exercise prescription set using results from initial 6 min walk test and THRR while considering  patient's activity barriers and safety.   Exercise Prescription Goal:  Initial exercise prescription builds to 30-45 minutes a day of aerobic activity, 2-3 days per week.  Home exercise guidelines will be given to patient during program as part of exercise prescription that the participant will acknowledge.  Activity Barriers & Risk Stratification:  Activity Barriers & Cardiac Risk Stratification - 12/29/20 1051       Activity Barriers & Cardiac Risk Stratification   Activity Barriers Other (comment)    Comments Trigger thumbs    Cardiac Risk Stratification High             6 Minute Walk:  6 Minute Walk     Row Name 12/29/20 1130         6 Minute Walk   Phase Initial     Distance 1741 feet     Walk Time 6 minutes     # of Rest Breaks 0     MPH 3.3     METS 3.8     RPE 9     Perceived Dyspnea  0     VO2 Peak 13.28     Symptoms  No     Resting HR 87 bpm     Resting BP 112/80     Resting Oxygen Saturation  98 %     Exercise Oxygen Saturation  during 6 min walk 98 %     Max Ex. HR 109 bpm     Max Ex. BP 130/84     2 Minute Post BP 114/76              Oxygen Initial Assessment:   Oxygen Re-Evaluation:   Oxygen Discharge (Final Oxygen Re-Evaluation):   Initial Exercise Prescription:  Initial Exercise Prescription - 12/29/20 1000       Date of Initial Exercise RX and Referring Provider   Date 12/29/20    Referring Provider Peter Martinique, MD    Expected Discharge Date 02/24/21      Treadmill   MPH 3    Grade 1    Minutes 15    METs 3.71      NuStep   Level 3    SPM 85    Minutes 15    METs 2.5      Prescription Details   Frequency (times per week) 3    Duration Progress to 30 minutes of continuous aerobic without signs/symptoms of physical distress      Intensity   THRR 40-80% of Max Heartrate 64-128    Ratings of Perceived Exertion 11-13    Perceived Dyspnea 0-4      Progression   Progression Continue progressive overload as per policy without signs/symptoms or physical distress.      Resistance Training   Training Prescription Yes    Weight 5 lbs    Reps 10-15             Perform Capillary Blood Glucose checks as needed.  Exercise Prescription Changes:   Exercise Prescription Changes     Row Name 01/04/21 1054 01/09/21 1051 01/23/21 1055 01/30/21 1056       Response to Exercise   Blood Pressure (Admit) 120/68 122/80 118/68 102/64    Blood Pressure (Exercise) 138/72 122/82 142/80 144/84    Blood Pressure (Exit) 102/72 118/76 118/64 102/72    Heart Rate (Admit) 66 bpm 84 bpm 76 bpm 87 bpm    Heart Rate (Exercise) 94 bpm 101 bpm 117 bpm 131 bpm    Heart Rate (Exit) 71 bpm 87 bpm 75 bpm 97 bpm  Rating of Perceived Exertion (Exercise) 12 11 11 11     Symptoms None None None None    Comments Off to a great start with exercise. -- Increased speed and incline on TM  today and tolerated well. --    Duration Continue with 30 min of aerobic exercise without signs/symptoms of physical distress. Continue with 30 min of aerobic exercise without signs/symptoms of physical distress. Continue with 30 min of aerobic exercise without signs/symptoms of physical distress. Continue with 30 min of aerobic exercise without signs/symptoms of physical distress.    Intensity THRR unchanged THRR unchanged THRR unchanged THRR New  THRR increased to 64-144 bpm (40%-90%)         Progression   Progression Continue to progress workloads to maintain intensity without signs/symptoms of physical distress. Continue to progress workloads to maintain intensity without signs/symptoms of physical distress. Continue to progress workloads to maintain intensity without signs/symptoms of physical distress. Continue to progress workloads to maintain intensity without signs/symptoms of physical distress.    Average METs 2.9 2.9 4.4 4.6         Resistance Training   Training Prescription No Yes Yes Yes    Weight -- 5 lbs 5 lbs 5 lbs    Reps -- 10-15 10-15 10-15    Time -- -- -- 10 Minutes         Interval Training   Interval Training No No No No         Treadmill   MPH 3 3 3.4 3.4    Grade 1 1 4 4     Minutes 15 15 15 15     METs 3.71 3.71 5.48 5.48         T5 Nustep   Level 3 3 4 4     SPM 85 85 85 85    Minutes 15 15 15 15     METs 2.2 2.1 3.4 3.7         Home Exercise Plan   Plans to continue exercise at -- -- Home (comment)  TM at home. Home (comment)  TM at home.    Frequency -- -- Add 2 additional days to program exercise sessions. Add 2 additional days to program exercise sessions.    Initial Home Exercises Provided -- -- 01/20/21 01/20/21             Exercise Comments:   Exercise Comments     Row Name 01/04/21 1146 01/09/21 1140 01/20/21 1049 01/23/21 1152 01/30/21 1105   Exercise Comments Patient tolerated 1st session of exrecise well without symptoms. Reviewed  METs and goals with patient. Reviewed home exercise guidelines and goals with patient. Reviewed METs with patient. Patient increased speed and incline on TM today. Patient would like to do weights on Wednesdays in addition to Mondays & Fridays. Patient will use the hand weights MWF. Reviewed goals with patient. Informed patient of THRR increase.            Exercise Goals and Review:   Exercise Goals     Row Name 12/29/20 1051             Exercise Goals   Increase Physical Activity Yes       Intervention Provide advice, education, support and counseling about physical activity/exercise needs.;Develop an individualized exercise prescription for aerobic and resistive training based on initial evaluation findings, risk stratification, comorbidities and participant's personal goals.       Expected Outcomes Short Term: Attend rehab on a regular basis to increase amount of  physical activity.;Long Term: Add in home exercise to make exercise part of routine and to increase amount of physical activity.;Long Term: Exercising regularly at least 3-5 days a week.       Increase Strength and Stamina Yes       Intervention Provide advice, education, support and counseling about physical activity/exercise needs.;Develop an individualized exercise prescription for aerobic and resistive training based on initial evaluation findings, risk stratification, comorbidities and participant's personal goals.       Expected Outcomes Short Term: Increase workloads from initial exercise prescription for resistance, speed, and METs.;Short Term: Perform resistance training exercises routinely during rehab and add in resistance training at home;Long Term: Improve cardiorespiratory fitness, muscular endurance and strength as measured by increased METs and functional capacity (6MWT)       Able to understand and use rate of perceived exertion (RPE) scale Yes       Intervention Provide education and explanation on how to use  RPE scale       Expected Outcomes Short Term: Able to use RPE daily in rehab to express subjective intensity level;Long Term:  Able to use RPE to guide intensity level when exercising independently       Knowledge and understanding of Target Heart Rate Range (THRR) Yes       Intervention Provide education and explanation of THRR including how the numbers were predicted and where they are located for reference       Expected Outcomes Short Term: Able to use daily as guideline for intensity in rehab;Long Term: Able to use THRR to govern intensity when exercising independently       Understanding of Exercise Prescription Yes       Intervention Provide education, explanation, and written materials on patient's individual exercise prescription       Expected Outcomes Short Term: Able to explain program exercise prescription;Long Term: Able to explain home exercise prescription to exercise independently                Exercise Goals Re-Evaluation :  Exercise Goals Re-Evaluation     Row Name 01/04/21 1146 01/09/21 1140 01/20/21 1049 01/30/21 1105       Exercise Goal Re-Evaluation   Exercise Goals Review Increase Physical Activity;Able to understand and use rate of perceived exertion (RPE) scale Increase Physical Activity;Able to understand and use rate of perceived exertion (RPE) scale Increase Physical Activity;Able to understand and use rate of perceived exertion (RPE) scale;Increase Strength and Stamina;Able to check pulse independently;Knowledge and understanding of Target Heart Rate Range (THRR);Understanding of Exercise Prescription Increase Physical Activity;Able to understand and use rate of perceived exertion (RPE) scale;Increase Strength and Stamina;Able to check pulse independently;Knowledge and understanding of Target Heart Rate Range (THRR);Understanding of Exercise Prescription    Comments Patient able to understand and use RPE scale appropriately. Patient is tolerating low to moderate  intensity exercise well without symptoms. Patient's goal is to develop an exercise routine, focus on eating a healthy diet, and to lose weight. Patient has a treadmill at home and 5 and 10 pound weights that he plans to use for his home exercise routine. Reviewed home exercise guidelines with patient. Patient is currently walking on his treadmill at home 3.0 mph for 5 minutes, and 3.5 mph for 15 minutes, 2 days/week as his mode of home exercise. Patient also has 5 lb weights at home and is doing his weight exercises 15 repetitions, 1 day/week. Encouraged patient to increase from 20 minutes to 30 minutes 2 days/week, and patient  is amenable to this. Patient has a pulse oximeter that he can use to check his pulse. Patient's goal is to gradually increase his pace and to lose weight. Patient continues to progress well with exercise. Patient feels that his endurance and stamina are much better now than prior to starting cardiac rehab. Patient feels his weight loss has plateaued. We discussed increasing duration rather than intensity with his home exercise to help with weight loss, and patient is amenable to this. Patient will be out of town for 1.5 weeks. Patient will walk while he's away as able. Received clearance from Dr. Martinique to increase patient's THRR to 64-144 (40-90%). Informed patient of THRR increase. Patient has a pulse oximeter to monitor his pulse with exercise.    Expected Outcomes Progress workloads as tolerated to help achieve personal health and fitness goals. Patient will begin and maintain an exercise routine including aerobic and resistance training that he can maintain long term. Patient will increase exercise duration at home from 20 minutes to 30 minutes to help achieve personal health and fitness goals. Patient will continue walking while out of town. Patient will increase home exercise routine duration to help achieve weight loss goal.             Discharge Exercise Prescription (Final  Exercise Prescription Changes):  Exercise Prescription Changes - 01/30/21 1056       Response to Exercise   Blood Pressure (Admit) 102/64    Blood Pressure (Exercise) 144/84    Blood Pressure (Exit) 102/72    Heart Rate (Admit) 87 bpm    Heart Rate (Exercise) 131 bpm    Heart Rate (Exit) 97 bpm    Rating of Perceived Exertion (Exercise) 11    Symptoms None    Duration Continue with 30 min of aerobic exercise without signs/symptoms of physical distress.    Intensity THRR New   THRR increased to 64-144 bpm (40%-90%)     Progression   Progression Continue to progress workloads to maintain intensity without signs/symptoms of physical distress.    Average METs 4.6      Resistance Training   Training Prescription Yes    Weight 5 lbs    Reps 10-15    Time 10 Minutes      Interval Training   Interval Training No      Treadmill   MPH 3.4    Grade 4    Minutes 15    METs 5.48      T5 Nustep   Level 4    SPM 85    Minutes 15    METs 3.7      Home Exercise Plan   Plans to continue exercise at Home (comment)   TM at home.   Frequency Add 2 additional days to program exercise sessions.    Initial Home Exercises Provided 01/20/21             Nutrition:  Target Goals: Understanding of nutrition guidelines, daily intake of sodium 1500mg , cholesterol 200mg , calories 30% from fat and 7% or less from saturated fats, daily to have 5 or more servings of fruits and vegetables.  Biometrics:  Pre Biometrics - 12/29/20 1025       Pre Biometrics   Waist Circumference 45.5 inches    Hip Circumference 47.25 inches    Waist to Hip Ratio 0.96 %    Triceps Skinfold 23 mm    % Body Fat 38 %    Grip Strength 37 kg  Flexibility 13 in    Single Leg Stand 30 seconds              Nutrition Therapy Plan and Nutrition Goals:  Nutrition Therapy & Goals - 01/09/21 1200       Nutrition Therapy   Diet TLC    Drug/Food Interactions Statins/Certain Fruits      Personal  Nutrition Goals   Nutrition Goal Pt to identify food quantities necessary to achieve weight loss of 6-12 lb at graduation from cardiac rehab.    Personal Goal #2 Pt to build a healthy plate including vegetables, fruits, whole grains, and low-fat dairy products in a heart healthy meal plan.    Personal Goal #3 Pt to incorporate beans 3-4 times per week      Intervention Plan   Intervention Prescribe, educate and counsel regarding individualized specific dietary modifications aiming towards targeted core components such as weight, hypertension, lipid management, diabetes, heart failure and other comorbidities.;Nutrition handout(s) given to patient.    Expected Outcomes Short Term Goal: A plan has been developed with personal nutrition goals set during dietitian appointment.;Long Term Goal: Adherence to prescribed nutrition plan.             Nutrition Assessments:  MEDIFICTS Score Key: ?70 Need to make dietary changes  40-70 Heart Healthy Diet ? 40 Therapeutic Level Cholesterol Diet   Flowsheet Row CARDIAC REHAB PHASE II EXERCISE from 01/09/2021 in [facility]  Picture Your Plate Total Score on Admission 77      Picture Your Plate Scores: <83 Unhealthy dietary pattern with much room for improvement. 41-50 Dietary pattern unlikely to meet recommendations for good health and room for improvement. 51-60 More healthful dietary pattern, with some room for improvement.  >60 Healthy dietary pattern, although there may be some specific behaviors that could be improved.    Nutrition Goals Re-Evaluation:  Nutrition Goals Re-Evaluation     [place] Name 01/09/21 1201             Goals   Current Weight 232 lb (105.2 kg)       Nutrition Goal Pt to identify food quantities necessary to achieve weight loss of 6-12 lb at graduation from cardiac rehab.               Personal Goal #2 Re-Evaluation   Personal Goal #2 Pt to build a healthy plate including  vegetables, fruits, whole grains, and low-fat dairy products in a heart healthy meal plan.               Personal Goal #3 Re-Evaluation   Personal Goal #3 Pt to incorporate beans 3-4 times per week                Nutrition Goals Re-Evaluation:  Nutrition Goals Re-Evaluation     [place] Name 01/09/21 1201             Goals   Current Weight 232 lb (105.2 kg)       Nutrition Goal Pt to identify food quantities necessary to achieve weight loss of 6-12 lb at graduation from cardiac rehab.               Personal Goal #2 Re-Evaluation   Personal Goal #2 Pt to build a healthy plate including vegetables, fruits, whole grains, and low-fat dairy products in a heart healthy meal plan.               Personal Goal #3 Re-Evaluation   Personal  Goal #3 Pt to incorporate beans 3-4 times per week                Nutrition Goals Discharge (Final Nutrition Goals Re-Evaluation):  Nutrition Goals Re-Evaluation - 01/09/21 1201       Goals   Current Weight 232 lb (105.2 kg)    Nutrition Goal Pt to identify food quantities necessary to achieve weight loss of 6-12 lb at graduation from cardiac rehab.      Personal Goal #2 Re-Evaluation   Personal Goal #2 Pt to build a healthy plate including vegetables, fruits, whole grains, and low-fat dairy products in a heart healthy meal plan.      Personal Goal #3 Re-Evaluation   Personal Goal #3 Pt to incorporate beans 3-4 times per week             Psychosocial: Target Goals: Acknowledge presence or absence of significant depression and/or stress, maximize coping skills, provide positive support system. Participant is able to verbalize types and ability to use techniques and skills needed for reducing stress and depression.  Initial Review & Psychosocial Screening:  Initial Psych Review & Screening - 12/29/20 1200       Initial Review   Current issues with None Identified      Family Dynamics   Good Support System? Yes    Comments Wife  very supportivce of his recovery.  Daughter who recently graduated lives with them and is helpful with keeping him on track with his nutrition and exercise      Barriers   Psychosocial barriers to participate in program There are no identifiable barriers or psychosocial needs.      Screening Interventions   Interventions Encouraged to exercise    Expected Outcomes Long Term goal: The participant improves quality of Life and PHQ9 Scores as seen by post scores and/or verbalization of changes             Quality of Life Scores:  Quality of Life - 12/29/20 1049       Quality of Life   Select Quality of Life      Quality of Life Scores   Health/Function Pre 19.53 %    Socioeconomic Pre 22.5 %    Psych/Spiritual Pre 22.21 %    Family Pre 25.2 %    GLOBAL Pre 21.53 %            Scores of 19 and below usually indicate a poorer quality of life in these areas.  A difference of  2-3 points is a clinically meaningful difference.  A difference of 2-3 points in the total score of the Quality of Life Index has been associated with significant improvement in overall quality of life, self-image, physical symptoms, and general health in studies assessing change in quality of life.  PHQ-9: Recent Review Flowsheet Data     Depression screen [facility] 2/9 12/29/2020   Decreased Interest 0   Down, Depressed, Hopeless 1   PHQ - 2 Score 1   Altered sleeping 1   Tired, decreased energy 1   Change in appetite 0   Feeling bad or failure about yourself  0   Trouble concentrating 0   Moving slowly or fidgety/restless 0   Suicidal thoughts 0   PHQ-9 Score 3   Difficult doing work/chores Not difficult at all      Interpretation of Total Score  Total Score Depression Severity:  1-4 = Minimal depression, 5-9 = Mild depression, 10-14 = Moderate depression, 15-19 =  Moderately severe depression, 20-27 = Severe depression   Psychosocial Evaluation and Intervention:   Psychosocial Re-Evaluation:   Psychosocial Re-Evaluation     [place] Name 01/04/21 1707 02/03/21 1605           Psychosocial Re-Evaluation   Current issues with None Identified Current Stress Concerns      Comments -- [name] did mention some stressors as he is preparing to place his home on the market in [place]. [name] also mentioned some stress regarding insomnia. [name] now wears a sleep mask and says he has been able to get more sleep.      Interventions Encouraged to attend Cardiac Rehabilitation for the exercise Encouraged to attend Cardiac Rehabilitation for the exercise      Continue Psychosocial Services  No Follow up required No Follow up required             Initial Review   Source of Stress Concerns -- --  insmonia and stress of [place] home out of state      Comments -- Will continue to monitor and offer support as needed.               Psychosocial Discharge (Final Psychosocial Re-Evaluation):  Psychosocial Re-Evaluation - 02/03/21 1605       Psychosocial Re-Evaluation   Current issues with Current Stress Concerns    Comments [name] did mention some stressors as he is preparing to place his home on the market in [place]. [name] also mentioned some stress regarding insomnia. [name] now wears a sleep mask and says he has been able to get more sleep.    Interventions Encouraged to attend Cardiac Rehabilitation for the exercise    Continue Psychosocial Services  No Follow up required      Initial Review   Source of Stress Concerns --   insmonia and stress of selling home out of state   Comments Will continue to monitor and offer support as needed.             Vocational Rehabilitation: Provide vocational rehab assistance to qualifying candidates.   Vocational Rehab Evaluation & Intervention:  Vocational Rehab - 12/29/20 1320       Initial Vocational Rehab Evaluation & Intervention   Assessment shows need for Vocational Rehabilitation No             Education: Education Goals:  Education classes will be provided on a weekly basis, covering required topics. Participant will state understanding/return demonstration of topics presented.  Learning Barriers/Preferences:  Learning Barriers/Preferences - 12/29/20 1050       Learning Barriers/Preferences   Learning Barriers Sight   wears glasses   Learning Preferences Skilled Demonstration;Individual Instruction;Group Instruction;Video;Computer/Internet             Education Topics: Count Your Pulse:  -Group instruction provided by verbal instruction, demonstration, patient participation and written materials to support subject.  Instructors address importance of being able to find your pulse and how to count your pulse when at home without a heart monitor.  Patients get hands on experience counting their pulse with staff help and individually.   Heart Attack, Angina, and Risk Factor Modification:  -Group instruction provided by verbal instruction, video, and written materials to support subject.  Instructors address signs and symptoms of angina and heart attacks.    Also discuss risk factors for heart disease and how to make changes to improve heart health risk factors.   Functional Fitness:  -Group instruction provided by verbal instruction, demonstration, patient participation, and  written materials to support subject.  Instructors address safety measures for doing things around the house.  Discuss how to get up and down off the floor, how to pick things up properly, how to safely get out of a chair without assistance, and balance training.   Meditation and Mindfulness:  -Group instruction provided by verbal instruction, patient participation, and written materials to support subject.  Instructor addresses importance of mindfulness and meditation practice to help reduce stress and improve awareness.  Instructor also leads participants through a meditation exercise.    Stretching for Flexibility and Mobility:   -Group instruction provided by verbal instruction, patient participation, and written materials to support subject.  Instructors lead participants through series of stretches that are designed to increase flexibility thus improving mobility.  These stretches are additional exercise for major muscle groups that are typically performed during regular warm up and cool down.   Hands Only CPR:  -Group verbal, video, and participation provides a basic overview of AHA guidelines for community CPR. Role-play of emergencies allow participants the opportunity to practice calling for help and chest compression technique with discussion of AED use.   Hypertension: -Group verbal and written instruction that provides a basic overview of hypertension including the most recent diagnostic guidelines, risk factor reduction with self-care instructions and medication management.    Nutrition I class: Heart Healthy Eating:  -Group instruction provided by PowerPoint slides, verbal discussion, and written materials to support subject matter. The instructor gives an explanation and review of the Therapeutic Lifestyle Changes diet recommendations, which includes a discussion on lipid goals, dietary fat, sodium, fiber, plant stanol/sterol esters, sugar, and the components of a well-balanced, healthy diet.   Nutrition II class: Lifestyle Skills:  -Group instruction provided by PowerPoint slides, verbal discussion, and written materials to support subject matter. The instructor gives an explanation and review of label reading, grocery shopping for heart health, heart healthy recipe modifications, and ways to make healthier choices when eating out.   Diabetes Question & Answer:  -Group instruction provided by PowerPoint slides, verbal discussion, and written materials to support subject matter. The instructor gives an explanation and review of diabetes co-morbidities, pre- and post-prandial blood glucose goals,  pre-exercise blood glucose goals, signs, symptoms, and treatment of hypoglycemia and hyperglycemia, and foot care basics.   Diabetes Blitz:  -Group instruction provided by PowerPoint slides, verbal discussion, and written materials to support subject matter. The instructor gives an explanation and review of the physiology behind type 1 and type 2 diabetes, diabetes medications and rational behind using different medications, pre- and post-prandial blood glucose recommendations and Hemoglobin A1c goals, diabetes diet, and exercise including blood glucose guidelines for exercising safely.    Portion Distortion:  -Group instruction provided by PowerPoint slides, verbal discussion, written materials, and food models to support subject matter. The instructor gives an explanation of serving size versus portion size, changes in portions sizes over the last 20 years, and what consists of a serving from each food group.   Stress Management:  -Group instruction provided by verbal instruction, video, and written materials to support subject matter.  Instructors review role of stress in heart disease and how to cope with stress positively.     Exercising on Your Own:  -Group instruction provided by verbal instruction, power point, and written materials to support subject.  Instructors discuss benefits of exercise, components of exercise, frequency and intensity of exercise, and end points for exercise.  Also discuss use of nitroglycerin and activating EMS.  Review  options of places to exercise outside of rehab.  Review guidelines for sex with heart disease.   Cardiac Drugs I:  -Group instruction provided by verbal instruction and written materials to support subject.  Instructor reviews cardiac drug classes: antiplatelets, anticoagulants, beta blockers, and statins.  Instructor discusses reasons, side effects, and lifestyle considerations for each drug class.   Cardiac Drugs II:  -Group instruction  provided by verbal instruction and written materials to support subject.  Instructor reviews cardiac drug classes: angiotensin converting enzyme inhibitors (ACE-I), angiotensin II receptor blockers (ARBs), nitrates, and calcium channel blockers.  Instructor discusses reasons, side effects, and lifestyle considerations for each drug class.   Anatomy and Physiology of the Circulatory System:  Group verbal and written instruction and models provide basic cardiac anatomy and physiology, with the coronary electrical and arterial systems. Review of: AMI, Angina, Valve disease, Heart Failure, Peripheral Artery Disease, Cardiac Arrhythmia, Pacemakers, and the ICD.   Other Education:  -Group or individual verbal, written, or video instructions that support the educational goals of the cardiac rehab program.   Holiday Eating Survival Tips:  -Group instruction provided by PowerPoint slides, verbal discussion, and written materials to support subject matter. The instructor gives patients tips, tricks, and techniques to help them not only survive but enjoy the holidays despite the onslaught of food that accompanies the holidays.   Knowledge Questionnaire Score:  Knowledge Questionnaire Score - 12/29/20 1049       Knowledge Questionnaire Score   Pre Score 23/24             Core Components/Risk Factors/Patient Goals at Admission:  Personal Goals and Risk Factors at Admission - 12/29/20 1053       Core Components/Risk Factors/Patient Goals on Admission    Weight Management Yes;Obesity;Weight Loss    Intervention Weight Management: Develop a combined nutrition and exercise program designed to reach desired caloric intake, while maintaining appropriate intake of nutrient and fiber, sodium and fats, and appropriate energy expenditure required for the weight goal.;Weight Management: Provide education and appropriate resources to help participant work on and attain dietary goals.;Weight  Management/Obesity: Establish reasonable short term and long term weight goals.;Obesity: Provide education and appropriate resources to help participant work on and attain dietary goals.    Admit Weight 232 lb 9.4 oz (105.5 kg)    Expected Outcomes Short Term: Continue to assess and modify interventions until short term weight is achieved;Long Term: Adherence to nutrition and physical activity/exercise program aimed toward attainment of established weight goal;Weight Maintenance: Understanding of the daily nutrition guidelines, which includes 25-35% calories from fat, 7% or less cal from saturated fats, less than 200mg  cholesterol, less than 1.5gm of sodium, & 5 or more servings of fruits and vegetables daily;Weight Loss: Understanding of general recommendations for a balanced deficit meal plan, which promotes 1-2 lb weight loss per week and includes a negative energy balance of [phone] kcal/d;Understanding recommendations for meals to include 15-35% energy as protein, 25-35% energy from fat, 35-60% energy from carbohydrates, less than 200mg  of dietary cholesterol, 20-35 gm of total fiber daily;Understanding of distribution of calorie intake throughout the day with the consumption of 4-5 meals/snacks    Hypertension Yes    Intervention Provide education on lifestyle modifcations including regular physical activity/exercise, weight management, moderate sodium restriction and increased consumption of fresh fruit, vegetables, and low fat dairy, alcohol moderation, and smoking cessation.;Monitor prescription use compliance.    Expected Outcomes Short Term: Continued assessment and intervention until BP is < 140/58mm HG in hypertensive  participants. < 130/64mm HG in hypertensive participants with diabetes, heart failure or chronic kidney disease.;Long Term: Maintenance of blood pressure at goal levels.    Lipids Yes    Intervention Provide education and support for participant on nutrition & aerobic/resistive  exercise along with prescribed medications to achieve LDL 70mg , HDL >40mg .    Expected Outcomes Short Term: Participant states understanding of desired cholesterol values and is compliant with medications prescribed. Participant is following exercise prescription and nutrition guidelines.;Long Term: Cholesterol controlled with medications as prescribed, with individualized exercise RX and with personalized nutrition plan. Value goals: LDL < 70mg , HDL > 40 mg.             Core Components/Risk Factors/Patient Goals Review:   Goals and Risk Factor Review     Row Name 01/04/21 1708 02/03/21 1610           Core Components/Risk Factors/Patient Goals Review   Personal Goals Review Weight Management/Obesity;Hypertension;Lipids Weight Management/Obesity;Hypertension;Lipids      Review [name] started CR on 01/04/21 and did well with exercise. Vital signs stable. [name] has been doing well with cardiac rehab. Vital signs have been stable. [name] has been able to increase his workloads. [name] is currently out of town.      Expected Outcomes [name] will continue to participate in phase 2 cardiac rehab for exercise, nutrition and lifestyle modifications. [name] will continue to participate in phase 2 cardiac rehab for exercise, nutrition and lifestyle modifications.               Core Components/Risk Factors/Patient Goals at Discharge (Final Review):   Goals and Risk Factor Review - 02/03/21 1610       Core Components/Risk Factors/Patient Goals Review   Personal Goals Review Weight Management/Obesity;Hypertension;Lipids    Review [name] has been doing well with cardiac rehab. Vital signs have been stable. [name] has been able to increase his workloads. [name] is currently out of town.    Expected Outcomes [name] will continue to participate in phase 2 cardiac rehab for exercise, nutrition and lifestyle modifications.             ITP Comments:  ITP Comments     Row Name 01/04/21 1706 02/03/21  1603         ITP Comments 30 Day ITP Review. [name] started cardiac rehab on 01/04/21 and did well with exercise. 30 Day ITP Review. [name] has good attendance and participation in phase 2. [name] is currently out of town in [place].               Comments: See ITP Comments

## 2021-02-08 ENCOUNTER — Encounter (HOSPITAL_COMMUNITY): Payer: 59

## 2021-02-10 ENCOUNTER — Encounter (HOSPITAL_COMMUNITY): Payer: 59

## 2021-02-13 ENCOUNTER — Encounter (HOSPITAL_COMMUNITY)
Admission: RE | Admit: 2021-02-13 | Discharge: 2021-02-13 | Disposition: A | Discharge status: Home / Self Care | Payer: 59 | Source: Ambulatory Visit | Attending: Cardiology | Admitting: Cardiology

## 2021-02-13 ENCOUNTER — Other Ambulatory Visit: Payer: Self-pay

## 2021-02-13 ENCOUNTER — Telehealth: Payer: Self-pay

## 2021-02-13 DIAGNOSIS — Z951 Presence of aortocoronary bypass graft: Secondary | ICD-10-CM

## 2021-02-13 DIAGNOSIS — I213 ST elevation (STEMI) myocardial infarction of unspecified site: Secondary | ICD-10-CM

## 2021-02-13 NOTE — Progress Notes (Signed)
Incomplete Session Note  Patient Details  Name: Keith Franklin MRN: [id number] Date of Birth: Sep 17, 1960 Referring Provider:   Flowsheet Row CARDIAC REHAB PHASE II ORIENTATION from 12/29/2020 in [facility]  Referring Provider Peter Martinique, MD       Keith Franklin did not complete his rehab session.  Keith Franklin reports that his incision has felt more sore than usual and he feels that when he drives his incision pulls and feels painful he notices especially when driving. Upon assessment today incision is well healed no drainage noted. No pain noted today. Keith Franklin says that he is concerned. Reports taking tylenol and used a lidocaine cream. Keith Franklin reports he has noticed any clicking. Dr Hendrickson's office called and notified. I spoke with [name] who spoke with the patient over the phone. Keith Franklin is returning from [place] has not participated in exercise for the pat week and a half. Will proceed with exercise per Dr Hendrickson's office and will continue to monitor. Will have Keith Franklin not use hand weights today.Barnet Pall, RN,BSN 02/13/2021 10:57 AM

## 2021-02-13 NOTE — Telephone Encounter (Signed)
Keith Franklin with Cardiac Rehab at [facility] contacted the office concerned about patient's sternal symptoms he has been having. 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 She placed patient on the phone and patient stated that his scar is "puffy" and sore when driving, like a "pulling" sensation at the top and bottom of his incisions. He states that he is using Lidocaine patches and Tylenol which seem to be helping with the discomfort. He did state that he did go on a 8 hour drive for which he stopped frequently. Advised that this motion of driving with hands on the steering wheel could have caused he discomfort to get worse and seems to be musculoskeletal in nature as driving position exacerbated symptoms and pain relievers help with pain. Advised to give the office a call back if he has any changes in his sternal incision or symptoms do not get better. He acknowledged receipt.

## 2021-02-15 ENCOUNTER — Ambulatory Visit (INDEPENDENT_AMBULATORY_CARE_PROVIDER_SITE_OTHER): Payer: 59

## 2021-02-15 ENCOUNTER — Encounter (HOSPITAL_COMMUNITY)
Admission: RE | Admit: 2021-02-15 | Discharge: 2021-02-15 | Disposition: A | Discharge status: Home / Self Care | Payer: 59 | Source: Ambulatory Visit | Attending: Cardiology | Admitting: Cardiology

## 2021-02-15 ENCOUNTER — Other Ambulatory Visit: Payer: Self-pay

## 2021-02-15 DIAGNOSIS — E782 Mixed hyperlipidemia: Secondary | ICD-10-CM

## 2021-02-15 DIAGNOSIS — Z951 Presence of aortocoronary bypass graft: Secondary | ICD-10-CM

## 2021-02-15 DIAGNOSIS — R0683 Snoring: Secondary | ICD-10-CM | POA: Diagnosis not present

## 2021-02-15 DIAGNOSIS — I213 ST elevation (STEMI) myocardial infarction of unspecified site: Secondary | ICD-10-CM | POA: Diagnosis not present

## 2021-02-15 DIAGNOSIS — I2510 Atherosclerotic heart disease of native coronary artery without angina pectoris: Secondary | ICD-10-CM

## 2021-02-15 DIAGNOSIS — I2583 Coronary atherosclerosis due to lipid rich plaque: Secondary | ICD-10-CM

## 2021-02-15 LAB — ECHOCARDIOGRAM COMPLETE
AR max vel: 2.4 cm2
AV Area VTI: 2.66 cm2
AV Area mean vel: 2.69 cm2
AV Mean grad: 3 mmHg
AV Peak grad: 6.3 mmHg
Ao pk vel: 1.25 m/s
Area-P 1/2: 2.95 cm2
S' Lateral: 4.56 cm

## 2021-02-17 ENCOUNTER — Encounter (HOSPITAL_COMMUNITY)
Admission: RE | Admit: 2021-02-17 | Discharge: 2021-02-17 | Disposition: A | Discharge status: Home / Self Care | Payer: 59 | Source: Ambulatory Visit | Attending: Cardiology | Admitting: Cardiology

## 2021-02-17 ENCOUNTER — Other Ambulatory Visit: Payer: Self-pay

## 2021-02-17 DIAGNOSIS — I213 ST elevation (STEMI) myocardial infarction of unspecified site: Secondary | ICD-10-CM | POA: Diagnosis not present

## 2021-02-17 DIAGNOSIS — Z951 Presence of aortocoronary bypass graft: Secondary | ICD-10-CM

## 2021-02-20 ENCOUNTER — Encounter (HOSPITAL_COMMUNITY)
Admission: RE | Admit: 2021-02-20 | Discharge: 2021-02-20 | Disposition: A | Discharge status: Home / Self Care | Payer: 59 | Source: Ambulatory Visit | Attending: Cardiology | Admitting: Cardiology

## 2021-02-20 ENCOUNTER — Other Ambulatory Visit: Payer: Self-pay

## 2021-02-20 DIAGNOSIS — Z951 Presence of aortocoronary bypass graft: Secondary | ICD-10-CM

## 2021-02-20 DIAGNOSIS — I213 ST elevation (STEMI) myocardial infarction of unspecified site: Secondary | ICD-10-CM | POA: Diagnosis not present

## 2021-02-22 ENCOUNTER — Encounter (HOSPITAL_COMMUNITY)
Admission: RE | Admit: 2021-02-22 | Discharge: 2021-02-22 | Disposition: A | Discharge status: Home / Self Care | Payer: 59 | Source: Ambulatory Visit | Attending: Cardiology | Admitting: Cardiology

## 2021-02-22 ENCOUNTER — Other Ambulatory Visit: Payer: Self-pay

## 2021-02-22 DIAGNOSIS — I213 ST elevation (STEMI) myocardial infarction of unspecified site: Secondary | ICD-10-CM

## 2021-02-22 DIAGNOSIS — Z951 Presence of aortocoronary bypass graft: Secondary | ICD-10-CM

## 2021-02-24 ENCOUNTER — Other Ambulatory Visit: Payer: Self-pay

## 2021-02-24 ENCOUNTER — Encounter (HOSPITAL_COMMUNITY)
Admission: RE | Admit: 2021-02-24 | Discharge: 2021-02-24 | Disposition: A | Discharge status: Home / Self Care | Payer: 59 | Source: Ambulatory Visit | Attending: Cardiology | Admitting: Cardiology

## 2021-02-24 VITALS — BP 128/70 | HR 89 | Ht 67.25 in | Wt 231.9 lb

## 2021-02-24 DIAGNOSIS — Z951 Presence of aortocoronary bypass graft: Secondary | ICD-10-CM

## 2021-02-24 DIAGNOSIS — I213 ST elevation (STEMI) myocardial infarction of unspecified site: Secondary | ICD-10-CM

## 2021-02-24 NOTE — Progress Notes (Addendum)
Discharge Progress Report  Patient Details  Name: Keith Franklin MRN: [id number] Date of Birth: February 13, 1961 Referring Provider:   Flowsheet Row CARDIAC REHAB PHASE II ORIENTATION from 12/29/2020 in [place]  Referring Provider Peter Martinique, MD        Number of Visits: 18  Reason for Discharge:  Patient reached a stable level of exercise. Patient independent in their exercise. Patient has met program and personal goals.  Smoking History:  Social History   Tobacco Use  Smoking Status Never  Smokeless Tobacco Never    Diagnosis:  ST elevation myocardial infarction (STEMI), unspecified artery (HCC)  S/P CABG x 3  ADL UCSD:   Initial Exercise Prescription:  Initial Exercise Prescription - 12/29/20 1000       Date of Initial Exercise RX and Referring Provider   Date 12/29/20    Referring Provider Peter Martinique, MD    Expected Discharge Date 02/24/21      Treadmill   MPH 3    Grade 1    Minutes 15    METs 3.71      NuStep   Level 3    SPM 85    Minutes 15    METs 2.5      Prescription Details   Frequency (times per week) 3    Duration Progress to 30 minutes of continuous aerobic without signs/symptoms of physical distress      Intensity   THRR 40-80% of Max Heartrate 64-128    Ratings of Perceived Exertion 11-13    Perceived Dyspnea 0-4      Progression   Progression Continue progressive overload as per policy without signs/symptoms or physical distress.      Resistance Training   Training Prescription Yes    Weight 5 lbs    Reps 10-15             Discharge Exercise Prescription (Final Exercise Prescription Changes):  Exercise Prescription Changes - 02/24/21 1053       Response to Exercise   Blood Pressure (Admit) 128/70    Blood Pressure (Exercise) 152/80    Blood Pressure (Exit) 116/62    Heart Rate (Admit) 89 bpm    Heart Rate (Exercise) 146 bpm    Heart Rate (Exit) 92 bpm    Rating of Perceived  Exertion (Exercise) 12    Symptoms None    Duration Continue with 30 min of aerobic exercise without signs/symptoms of physical distress.    Intensity THRR unchanged      Progression   Progression Continue to progress workloads to maintain intensity without signs/symptoms of physical distress.    Average METs 5.1      Resistance Training   Training Prescription Yes    Weight 5 lbs    Reps 10-15    Time 10 Minutes      Interval Training   Interval Training No      Treadmill   MPH 3.5    Grade 5    Minutes 15    METs 6.09      T5 Nustep   Level 6    SPM 85    Minutes 15    METs 4.1      Home Exercise Plan   Plans to continue exercise at Home (comment)   TM at home.   Frequency Add 2 additional days to program exercise sessions.    Initial Home Exercises Provided 01/20/21  Functional Capacity:  6 Minute Walk     Row Name 12/29/20 1130 02/20/21 1049       6 Minute Walk   Phase Initial Discharge    Distance 1741 feet 1841 feet    Distance % Change -- 5.74 %    Distance Feet Change -- 100 ft    Walk Time 6 minutes 6 minutes    # of Rest Breaks 0 0    MPH 3.3 3.49    METS 3.8 3.99    RPE 9 12    Perceived Dyspnea  0 0    VO2 Peak 13.28 13.98    Symptoms No No    Resting HR 87 bpm 74 bpm    Resting BP 112/80 126/80    Resting Oxygen Saturation  98 % --    Exercise Oxygen Saturation  during 6 min walk 98 % --    Max Ex. HR 109 bpm 106 bpm    Max Ex. BP 130/84 138/82    2 Minute Post BP 114/76 100/68             Psychological, QOL, Others - Outcomes: PHQ 2/9: Depression screen [facility] 2/9 02/22/2021 12/29/2020  Decreased Interest 0 0  Down, Depressed, Hopeless 0 1  PHQ - 2 Score 0 1  Altered sleeping - 1  Tired, decreased energy - 1  Change in appetite - 0  Feeling bad or failure about yourself  - 0  Trouble concentrating - 0  Moving slowly or fidgety/restless - 0  Suicidal thoughts - 0  PHQ-9 Score - 3  Difficult doing work/chores -  Not difficult at all    Quality of Life:  Quality of Life - 02/21/21 0831       Quality of Life   Select Quality of Life      Quality of Life Scores   Health/Function Pre 19.53 %    Health/Function Post 25.9 %    Health/Function % Change 32.62 %    Socioeconomic Pre 22.5 %    Socioeconomic Post 28.93 %    Socioeconomic % Change  28.58 %    Psych/Spiritual Pre 22.21 %    Psych/Spiritual Post 24.64 %    Psych/Spiritual % Change 10.94 %    Family Pre 25.2 %    Family Post 28.8 %    Family % Change 14.29 %    GLOBAL Pre 21.53 %    GLOBAL Post 26.69 %    GLOBAL % Change 23.97 %             Personal Goals: Goals established at orientation with interventions provided to work toward goal.  Personal Goals and Risk Factors at Admission - 12/29/20 1053       Core Components/Risk Factors/Patient Goals on Admission    Weight Management Yes;Obesity;Weight Loss    Intervention Weight Management: Develop a combined nutrition and exercise program designed to reach desired caloric intake, while maintaining appropriate intake of nutrient and fiber, sodium and fats, and appropriate energy expenditure required for the weight goal.;Weight Management: Provide education and appropriate resources to help participant work on and attain dietary goals.;Weight Management/Obesity: Establish reasonable short term and long term weight goals.;Obesity: Provide education and appropriate resources to help participant work on and attain dietary goals.    Admit Weight 232 lb 9.4 oz (105.5 kg)    Expected Outcomes Short Term: Continue to assess and modify interventions until short term weight is achieved;Long Term: Adherence to nutrition and physical activity/exercise program  aimed toward attainment of established weight goal;Weight Maintenance: Understanding of the daily nutrition guidelines, which includes 25-35% calories from fat, 7% or less cal from saturated fats, less than 270m cholesterol, less than 1.5gm  of sodium, & 5 or more servings of fruits and vegetables daily;Weight Loss: Understanding of general recommendations for a balanced deficit meal plan, which promotes 1-2 lb weight loss per week and includes a negative energy balance of [phone] kcal/d;Understanding recommendations for meals to include 15-35% energy as protein, 25-35% energy from fat, 35-60% energy from carbohydrates, less than 2057mof dietary cholesterol, 20-35 gm of total fiber daily;Understanding of distribution of calorie intake throughout the day with the consumption of 4-5 meals/snacks    Hypertension Yes    Intervention Provide education on lifestyle modifcations including regular physical activity/exercise, weight management, moderate sodium restriction and increased consumption of fresh fruit, vegetables, and low fat dairy, alcohol moderation, and smoking cessation.;Monitor prescription use compliance.    Expected Outcomes Short Term: Continued assessment and intervention until BP is < 140/9050mG in hypertensive participants. < 130/45m74m in hypertensive participants with diabetes, heart failure or chronic kidney disease.;Long Term: Maintenance of blood pressure at goal levels.    Lipids Yes    Intervention Provide education and support for participant on nutrition & aerobic/resistive exercise along with prescribed medications to achieve LDL <70mg40mL >40mg.38mExpected Outcomes Short Term: Participant states understanding of desired cholesterol values and is compliant with medications prescribed. Participant is following exercise prescription and nutrition guidelines.;Long Term: Cholesterol controlled with medications as prescribed, with individualized exercise RX and with personalized nutrition plan. Value goals: LDL < 70mg, 26m> 40 mg.              Personal Goals Discharge:  Goals and Risk Factor Review     Row Name 01/04/21 1708 02/03/21 1610 02/24/21 1203         Core Components/Risk Factors/Patient Goals  Review   Personal Goals Review Weight Management/Obesity;Hypertension;Lipids Weight Management/Obesity;Hypertension;Lipids Weight Management/Obesity;Hypertension;Lipids     Review Keith Franklin CR on 01/04/21 and did well with exercise. Vital signs stable. Keith Franklin doing well with cardiac rehab. Vital signs have been stable. Keith Franklin able to increase his workloads. Keith Franklin out of town. Keith Franklin doing well with cardiac rehab. Vital signs have been stable. Keith Franklin phase 2 cardiac rehab on today, 02/24/21     Expected Outcomes Keith Franklin to participate in phase 2 cardiac rehab for exercise, nutrition and lifestyle modifications. Keith Franklin to participate in phase 2 cardiac rehab for exercise, nutrition and lifestyle modifications. Keith Franklin to  exercise, follow  nutrition and lifestyle modifications upon completion of phase 2 cardiac rehab.              Exercise Goals and Review:  Exercise Goals     Row Name 12/29/20 1051             Exercise Goals   Increase Physical Activity Yes       Intervention Provide advice, education, support and counseling about physical activity/exercise needs.;Develop an individualized exercise prescription for aerobic and resistive training based on initial evaluation findings, risk stratification, comorbidities and participant's personal goals.       Expected Outcomes Short Term: Attend rehab on a regular basis to increase amount of physical activity.;Long Term: Add in home exercise to make exercise part of routine and to increase amount of physical activity.;Long Term: Exercising regularly at least 3-5  days a week.       Increase Strength and Stamina Yes       Intervention Provide advice, education, support and counseling about physical activity/exercise needs.;Develop an individualized exercise prescription for aerobic and resistive training based on initial evaluation findings, risk stratification,  comorbidities and participant's personal goals.       Expected Outcomes Short Term: Increase workloads from initial exercise prescription for resistance, speed, and METs.;Short Term: Perform resistance training exercises routinely during rehab and add in resistance training at home;Long Term: Improve cardiorespiratory fitness, muscular endurance and strength as measured by increased METs and functional capacity (6MWT)       Able to understand and use rate of perceived exertion (RPE) scale Yes       Intervention Provide education and explanation on how to use RPE scale       Expected Outcomes Short Term: Able to use RPE daily in rehab to express subjective intensity level;Long Term:  Able to use RPE to guide intensity level when exercising independently       Knowledge and understanding of Target Heart Rate Range (THRR) Yes       Intervention Provide education and explanation of THRR including how the numbers were predicted and where they are located for reference       Expected Outcomes Short Term: Able to use daily as guideline for intensity in rehab;Long Term: Able to use THRR to govern intensity when exercising independently       Understanding of Exercise Prescription Yes       Intervention Provide education, explanation, and written materials on patient's individual exercise prescription       Expected Outcomes Short Term: Able to explain program exercise prescription;Long Term: Able to explain home exercise prescription to exercise independently                Exercise Goals Re-Evaluation:  Exercise Goals Re-Evaluation     Row Name 01/04/21 1146 01/09/21 1140 01/20/21 1049 01/30/21 1105 02/13/21 1103     Exercise Goal Re-Evaluation   Exercise Goals Review Increase Physical Activity;Able to understand and use rate of perceived exertion (RPE) scale Increase Physical Activity;Able to understand and use rate of perceived exertion (RPE) scale Increase Physical Activity;Able to understand and  use rate of perceived exertion (RPE) scale;Increase Strength and Stamina;Able to check pulse independently;Knowledge and understanding of Target Heart Rate Range (THRR);Understanding of Exercise Prescription Increase Physical Activity;Able to understand and use rate of perceived exertion (RPE) scale;Increase Strength and Stamina;Able to check pulse independently;Knowledge and understanding of Target Heart Rate Range (THRR);Understanding of Exercise Prescription Increase Physical Activity;Able to understand and use rate of perceived exertion (RPE) scale;Increase Strength and Stamina;Able to check pulse independently;Knowledge and understanding of Target Heart Rate Range (THRR);Understanding of Exercise Prescription   Comments Patient able to understand and use RPE scale appropriately. Patient is tolerating low to moderate intensity exercise well without symptoms. Patient's goal is to develop an exercise routine, focus on eating a healthy diet, and to lose weight. Patient has a treadmill at home and 5 and 10 pound weights that he plans to use for his home exercise routine. Reviewed home exercise guidelines with patient. Patient is currently walking on his treadmill at home 3.0 mph for 5 minutes, and 3.5 mph for 15 minutes, 2 days/week as his mode of home exercise. Patient also has 5 lb weights at home and is doing his weight exercises 15 repetitions, 1 day/week. Encouraged patient to increase from 20 minutes to 30 minutes 2  days/week, and patient is amenable to this. Patient has a pulse oximeter that he can use to check his pulse. Patient's goal is to gradually increase his pace and to lose weight. Patient continues to progress well with exercise. Patient feels that his endurance and stamina are much better now than prior to starting cardiac rehab. Patient feels his weight loss has plateaued. We discussed increasing duration rather than intensity with his home exercise to help with weight loss, and patient is  amenable to this. Patient will be out of town for 1.5 weeks. Patient will walk while he's away as able. Received clearance from Dr. Martinique to increase patient's THRR to 64-144 (40-90%). Informed patient of THRR increase. Patient has a pulse oximeter to monitor his pulse with exercise. Patient returned to exercise today after 2-week work trip. Per patient he did not maintain his exercise routine while he was out of town. Patient walked about 1.8 miles one day during his trip. Patient feels he was on trajectory to achieving his personal goals prior to his trip and is ready to get back on track. Patient resumed exercise today without any problems. Patient is looking into joining [employer] upon completion of the cardiac rehab program.   Expected Outcomes Progress workloads as tolerated to help achieve personal health and fitness goals. Patient will begin and maintain an exercise routine including aerobic and resistance training that he can maintain long term. Patient will increase exercise duration at home from 20 minutes to 30 minutes to help achieve personal health and fitness goals. Patient will continue walking while out of town. Patient will increase home exercise routine duration to help achieve weight loss goal. Continue to progress workloads. Resume regular exercise routine.    [place] Name 02/20/21 1100 02/22/21 1135 03/08/21 1618         Exercise Goal Re-Evaluation   Exercise Goals Review Increase Physical Activity;Able to understand and use rate of perceived exertion (RPE) scale;Increase Strength and Stamina;Able to check pulse independently;Knowledge and understanding of Target Heart Rate Range (THRR);Understanding of Exercise Prescription Increase Physical Activity;Able to understand and use rate of perceived exertion (RPE) scale;Increase Strength and Stamina;Able to check pulse independently;Knowledge and understanding of Target Heart Rate Range (THRR);Understanding of Exercise Prescription  Increase Physical Activity;Able to understand and use rate of perceived exertion (RPE) scale;Increase Strength and Stamina;Able to check pulse independently;Knowledge and understanding of Target Heart Rate Range (THRR);Understanding of Exercise Prescription     Comments Patient scheduled to complete the cardiac rehab program on 02/24/21. Patient will continue exercise at [place], referral sent. Patient's functional capacity increased 6% as measured by 6MWT, strength increased 20% as measured by grip strength test. Patient will continue exercise at [place] and use his TM at home as his mode of exercise upon completion of the cardiac rehab program. Patient completed the cardiac rehab program and progressed well achieving 5.1 METs with exercise. Patient will continue exercise at [place] and use his TM at home as his mode of exercise.     Expected Outcomes Patient will continue exercise at [place] upon completion of the cardiac rehab program. Patient will exercise 30 minutes at least 5 days/week. Patient will exercise 30 minutes at least 5 days/week to maintain health and fitness gains.              Nutrition & Weight - Outcomes:  Pre Biometrics - 12/29/20 1025       Pre Biometrics  Waist Circumference 45.5 inches    Hip Circumference 47.25 inches    Waist to Hip Ratio 0.96 %    Triceps Skinfold 23 mm    % Body Fat 38 %    Grip Strength 37 kg    Flexibility 13 in    Single Leg Stand 30 seconds             Post Biometrics - 02/24/21 1050        Post  Biometrics   Height 5' 7.25" (1.708 m)    Waist Circumference 43.25 inches    Hip Circumference 45.75 inches    Waist to Hip Ratio 0.95 %    BMI (Calculated) 36.06    Triceps Skinfold 22 mm    % Body Fat 33.6 %    Grip Strength 44.5 kg   *Different dynamometer used   Flexibility 14.25 in    Single Leg Stand 17.62 seconds             Nutrition:   Nutrition Therapy & Goals - 01/09/21 1200       Nutrition Therapy   Diet TLC    Drug/Food Interactions Statins/Certain Fruits      Personal Nutrition Goals   Nutrition Goal Pt to identify food quantities necessary to achieve weight loss of 6-12 lb at graduation from cardiac rehab.    Personal Goal #2 Pt to build a healthy Franklin including vegetables, fruits, whole grains, and low-fat dairy products in a heart healthy meal plan.    Personal Goal #3 Pt to incorporate beans 3-4 times per week      Intervention Plan   Intervention Prescribe, educate and counsel regarding individualized specific dietary modifications aiming towards targeted core components such as weight, hypertension, lipid management, diabetes, heart failure and other comorbidities.;Nutrition handout(s) given to patient.    Expected Outcomes Short Term Goal: A plan has been developed with personal nutrition goals set during dietitian appointment.;Long Term Goal: Adherence to prescribed nutrition plan.             Nutrition Discharge:   Education Questionnaire Score:  Knowledge Questionnaire Score - 02/21/21 0832       Knowledge Questionnaire Score   Pre Score 23/24    Post Score 24/24             Goals reviewed with patient; copy given to patient. Keith Franklin graduated from cardiac rehab program on 02/24/21 with completion of 18 exercise sessions in Phase II. Pt maintained good attendance and progressed nicely during his participation in rehab as evidenced by increased MET level.   Medication list reconciled. Repeat  PHQ score- 0 .  Pt has made significant lifestyle changes and should be commended for his success. Pt feels he has achieved his goals during cardiac rehab.   Pt plans to continue exercise in by exercising at the [employer]. Keith Franklin increased his distance on his post exercise walk test by 100 feet. Keith Franklin says that he feels stronger and has more confidence about exercising since participating in phase 2  cardiac rehab. We are proud of Keith's progress! Barnet Pall, RN,BSN 03/09/2021 8:37 AM

## 2021-03-08 NOTE — Addendum Note (Signed)
Encounter addended by: Magda Kiel, RN on: 03/08/2021 4:37 PM  Actions taken: Clinical Note Signed

## 2021-03-17 ENCOUNTER — Telehealth: Payer: Self-pay | Admitting: Cardiology

## 2021-03-17 ENCOUNTER — Other Ambulatory Visit (HOSPITAL_BASED_OUTPATIENT_CLINIC_OR_DEPARTMENT_OTHER): Payer: Self-pay

## 2021-03-17 ENCOUNTER — Other Ambulatory Visit: Payer: Self-pay | Admitting: Urology

## 2021-03-17 ENCOUNTER — Ambulatory Visit: Payer: 59 | Attending: Internal Medicine

## 2021-03-17 ENCOUNTER — Other Ambulatory Visit: Payer: Self-pay

## 2021-03-17 DIAGNOSIS — Z23 Encounter for immunization: Secondary | ICD-10-CM

## 2021-03-17 MED ORDER — PFIZER COVID-19 VAC BIVALENT 30 MCG/0.3ML IM SUSP
INTRAMUSCULAR | 0 refills | Status: DC
  Filled 2021-03-17: qty 0.3, 1d supply, fill #0

## 2021-03-17 NOTE — Telephone Encounter (Signed)
   Primary Cardiologist: Peter Martinique, MD  Chart reviewed as part of pre-operative protocol coverage. Given past medical history and time since last visit, based on ACC/AHA guidelines, Arel Tippen would be at acceptable risk for the planned procedure without further cardiovascular testing.   His aspirin may be held for 5 days prior to his procedure and his Plavix may be held for 7 days prior to his procedure.  Please resume dual antiplatelet therapy as soon as hemostasis is achieved at the discretion of the surgeon.  I will route this recommendation to the requesting party via Epic fax function and remove from pre-op pool.  Please call with questions.  Jossie Ng. Benjimen Kelley NP-C    03/17/2021, 12:34 PM [place] [address] Office [phone] Fax [phone]

## 2021-03-17 NOTE — Telephone Encounter (Signed)
   [facility] Pre-operative Risk Assessment    Request for surgical clearance:  What type of surgery is being performed? Urethral dilation   When is this surgery scheduled? 12//03/2021   What type of clearance is required (medical clearance vs. Pharmacy clearance to hold med vs. Both)? both  Are there any medications that need to be held prior to surgery and how long? aspirin EC 81 MG EC tablet and  clopidogrel (PLAVIX) 75 MG tablet  Practice name and name of physician performing surgery? Dr. Link Snuffer   What is your office phone number [phone] ext [phone]     7.   What is your office fax number [phone]  8.   Anesthesia type (None, local, MAC, general) ? General    Keith Franklin 03/17/2021, 10:26 AM  _________________________________________________________________   (provider comments below)

## 2021-03-17 NOTE — Telephone Encounter (Signed)
Keith Franklin "[name]" 60 year old male is requesting preoperative cardiac evaluation for urethral dilation.  He was last seen in the clinic on 01/24/2021.  He continue to increase his physical activity and was doing well post CABG x3  10/03/2020.  His cholesterol was well controlled.  Follow-up was planned for 6 months.  His PMH includes  hypoglycemia, PEA cardiac arrest, coronary artery disease, hyperlipidemia, and essential hypertension.   May his aspirin and Plavix be held prior to his procedure?  Thank you for your help.  Please direct your response to [facility] preop pool.  Jossie Ng. Dorotea Hand NP-C    03/17/2021, 10:44 AM [place] [address] Office [phone] Fax [phone]

## 2021-03-17 NOTE — Telephone Encounter (Signed)
OK to hold antiplatelet therapy for urologic procedure  Mali Eppard Martinique MD, [facility]

## 2021-03-17 NOTE — Progress Notes (Signed)
   Covid-19 Vaccination Clinic  Name:  Keith Franklin    MRN: [id number] DOB: 01-14-1961  03/17/2021  [name] was observed post Covid-19 immunization for 15 minutes without incident. He was provided with Vaccine Information Sheet and instruction to access the V-Safe system.   [name] was instructed to call 911 with any severe reactions post vaccine: Difficulty breathing  Swelling of face and throat  A fast heartbeat  A bad rash all over body  Dizziness and weakness   Immunizations Administered     Name Date Dose VIS Date Route   Pfizer Covid-19 Vaccine Bivalent Booster 03/17/2021  2:10 PM 0.3 mL 12/28/2020 Intramuscular   Manufacturer: [place]   Lot: [id number]   [place]: [phone]

## 2021-03-20 NOTE — Progress Notes (Signed)
Sent message, via epic in basket, requesting orders in epic from surgeon.  

## 2021-03-28 ENCOUNTER — Telehealth: Payer: Self-pay | Admitting: Neurology

## 2021-03-28 ENCOUNTER — Ambulatory Visit (INDEPENDENT_AMBULATORY_CARE_PROVIDER_SITE_OTHER): Payer: 59 | Admitting: Neurology

## 2021-03-28 ENCOUNTER — Encounter: Payer: Self-pay | Admitting: Neurology

## 2021-03-28 VITALS — BP 110/80 | HR 58 | Ht 68.0 in | Wt 241.1 lb

## 2021-03-28 DIAGNOSIS — R0681 Apnea, not elsewhere classified: Secondary | ICD-10-CM | POA: Diagnosis not present

## 2021-03-28 DIAGNOSIS — Z951 Presence of aortocoronary bypass graft: Secondary | ICD-10-CM

## 2021-03-28 DIAGNOSIS — G4734 Idiopathic sleep related nonobstructive alveolar hypoventilation: Secondary | ICD-10-CM | POA: Diagnosis not present

## 2021-03-28 DIAGNOSIS — G4719 Other hypersomnia: Secondary | ICD-10-CM | POA: Diagnosis not present

## 2021-03-28 DIAGNOSIS — R351 Nocturia: Secondary | ICD-10-CM

## 2021-03-28 DIAGNOSIS — E669 Obesity, unspecified: Secondary | ICD-10-CM

## 2021-03-28 DIAGNOSIS — I252 Old myocardial infarction: Secondary | ICD-10-CM

## 2021-03-28 DIAGNOSIS — R0683 Snoring: Secondary | ICD-10-CM

## 2021-03-28 NOTE — Progress Notes (Signed)
Subjective:    Patient ID: Keith Franklin is a 60 y.o. male.  HPI    Star Age, MD, PhD [facility] [address] P.O. [place], [place] [address]  Dear Yetta Flock,   I saw your patient, Keith Franklin, upon your kind request in my sleep clinic today for initial consultation of his sleep disorder, in particular, concern for underlying obstructive sleep apnea.  The patient is unaccompanied today.  As you know, Keith Franklin is a 60 year old right-handed gentleman with an underlying medical history of MI, bradycardia, status post three-vessel CABG in June 2022, nephrolithiasis, history of COVID, skin cancer, reflux disease, and obesity, who reports snoring and difficulty maintaining sleep.  His wife has noted pauses in his breathing while he is asleep.  He also reports daytime tiredness.  I reviewed your office note from 01/13/2021.  He has been on melatonin for sleep and has also tried p.m. type medications.  He was noted to have oxygen desaturations while asleep when he was hospitalized for his heart attack in June 2022 and was suspected to have obstructive sleep apnea.  He reports that right after his open heart surgery he was briefly placed on BiPAP therapy to regulate his breathing.  His Epworth sleepiness score is 10 out of 24, fatigue severity score is 38 out of 63.  He is married and has 2 grown daughters, older daughter lives in [place] and is finishing her nursing degree, younger daughter lives with, just finished college.  He is working on weight loss.  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  He has a bedtime of around 10 PM but wakes up often in the early morning hours around 3 AM.  He has had trouble falling asleep after that.  He has had good success with Advil PM but cannot take it any longer since his heart attack.  He tried Tylenol PM  which did not work as well.  Melatonin did not help either.  He suspects that his father may have had sleep apnea but was never tested, he was a loud snorer.  Patient is a non-smoker and drinks alcohol rarely, caffeine in limitation in the form of coffee, 1 or 2 cups in the mornings only.  He has nocturia about once or twice per average night, typically no recurrent morning headaches.  He finished cardiac rehab and is going to start going to the gym.  He is scheduled to have a cystoscopy with balloon dilatation done next month.  An office-based procedure was not possible as he had scar tissue.  He does have a history of passing a kidney stone in the past.  His Past Medical History Is Significant For: Past Medical History:  Diagnosis Date   Cancer ([place]) 2010   skin cancer, Basal Cell Carcinoma    COVID-19 07/2019   GERD (gastroesophageal reflux disease)    Myocardial infarction ([place]) 09/30/2020    His Past Surgical History Is Significant For: Past Surgical History:  Procedure Laterality Date   CORONARY ARTERY BYPASS GRAFT N/A 10/03/2020   Procedure: CORONARY ARTERY BYPASS GRAFTING (CABG) X THREE, ON PUMP, USING LEFT INTERNAL MAMMARY ARTERY AND RIGHT GREATER SAPHENOUS ENDOSCOPIC VEIN HARVEST CONDUITS;  Surgeon: Melrose Nakayama, MD;  Location: [place];  Service: Open Heart Surgery;  Laterality: N/A;  will harvest left radial artery for conduit Swan   CORONARY/GRAFT ACUTE MI REVASCULARIZATION  N/A 09/30/2020   Procedure: Coronary/Graft Acute MI Revascularization;  Surgeon: Martinique, Peter M, MD;  Location: [place] [facility];  Service: Cardiovascular;  Laterality: N/A;   LEFT HEART CATH AND CORONARY ANGIOGRAPHY N/A 09/30/2020   Procedure: LEFT HEART CATH AND CORONARY ANGIOGRAPHY;  Surgeon: Martinique, Peter M, MD;  Location: [place] [facility];  Service: Cardiovascular;  Laterality: N/A;   skin cancer removal      His Family History Is Significant For: Family History  Problem Relation Age of Onset    Cancer Mother    Heart attack Father    Sleep apnea Neg Hx     His Social History Is Significant For: Social History   Socioeconomic History   Marital status: Married    Spouse name: Not on file   Number of children: Not on file   Years of education: Not on file   Highest education level: Not on file  Occupational History   Not on file  Tobacco Use   Smoking status: Never   Smokeless tobacco: Never  Vaping Use   Vaping Use: Never used  Substance and Sexual Activity   Alcohol use: Yes    Alcohol/week: 1.0 standard drink    Types: 1 Cans of beer per week   Drug use: Never   Sexual activity: Not on file  Other Topics Concern   Not on file  Social History Narrative   Not on file   Social Determinants of Health   Financial Resource Strain: Not on file  Food Insecurity: Not on file  Transportation Needs: Not on file  Physical Activity: Not on file  Stress: Not on file  Social Connections: Not on file    His Allergies Are:  No Known Allergies:   His Current Medications Are:  Outpatient Encounter Medications as of 03/28/2021  Medication Sig   aspirin EC 81 MG EC tablet Take 1 tablet (81 mg total) by mouth daily. Swallow whole.   atorvastatin (LIPITOR) 40 MG tablet Take 1 tablet (40 mg total) by mouth daily.   clopidogrel (PLAVIX) 75 MG tablet Take 1 tablet (75 mg total) by mouth daily.   COVID-19 mRNA bivalent vaccine, Pfizer, (PFIZER COVID-19 VAC BIVALENT) injection Inject into the muscle.   famotidine (PEPCID) 10 MG tablet Take 10 mg by mouth daily as needed for heartburn or indigestion.   metoprolol tartrate (LOPRESSOR) 25 MG tablet Take 0.5 tablets (12.5 mg total) by mouth 2 (two) times daily.   tamsulosin (FLOMAX) 0.4 MG CAPS capsule Take 0.4 mg by mouth daily.   Simethicone (GAS-X PO) Take 2 tablets by mouth daily as needed (bloating).   traMADol (ULTRAM) 50 MG tablet Take 1 tablet (50 mg total) by mouth every 6 (six) hours as needed for severe pain.   No  facility-administered encounter medications on file as of 03/28/2021.  :   Review of Systems:  Out of a complete 14 point review of systems, all are reviewed and negative with the exception of these symptoms as listed below:  Review of Systems  Neurological:        Pt is here for sleep consult . Pt states he had open heart surgery June 6th  pt was informed than they thought he had some sleep apnea . Pt states wife said he does snores and stop breathing during the night. Pt states he does have fatigue throughout the day. Pt states he does have insomnia. Pt denies hypertension , and  headaches in am. Pt states he never has  sleep study and no CPAP at home . Pt states after surgery he had to have a BI PAP for a couple of hours to regulate his breathing.Pt states after that breathing was fine   FSS:38 ESS:10   Objective:  Neurological Exam  Physical Exam Physical Examination:   Vitals:   03/28/21 0920  BP: 110/80  Pulse: (!) 58    General Examination: The patient is a very pleasant 60 y.o. male in no acute distress. He appears well-developed and well-nourished and well groomed.   HEENT: Normocephalic, atraumatic, pupils are equal, round and reactive to light, extraocular tracking is good without limitation to gaze excursion or nystagmus noted. Hearing is grossly intact. Face is symmetric with normal facial animation. Speech is clear with no dysarthria noted. There is no hypophonia. There is no lip, neck/head, jaw or voice tremor. Neck is supple with full range of passive and active motion. There are no carotid bruits on auscultation. Oropharynx exam reveals: mild mouth dryness, good dental hygiene and moderate airway crowding, due to small airway entry, Mallampati class II.  Tonsils absent.  Tongue protrudes centrally and palate elevates symmetrically, neck circumference of 18 inches.  Minimal to no significant overbite.  Chest: Clear to auscultation without wheezing, rhonchi or crackles  noted.  Heart: S1+S2+0, regular and normal without murmurs, rubs or gallops noted.  Mild bradycardia noted.  Abdomen: Soft, non-tender and non-distended.  Extremities: There is no pitting edema in the distal lower extremities bilaterally.   Skin: Warm and dry without trophic changes noted.   Musculoskeletal: exam reveals no obvious joint deformities.   Neurologically:  Mental status: The patient is awake, alert and oriented in all 4 spheres. His immediate and remote memory, attention, language skills and fund of knowledge are appropriate. There is no evidence of aphasia, agnosia, apraxia or anomia. Speech is clear with normal prosody and enunciation. Thought process is linear. Mood is normal and affect is normal.  Cranial nerves II - XII are as described above under HEENT exam.  Motor exam: Normal bulk, strength and tone is noted. There is no tremor, fine motor skills and coordination: grossly intact.  Cerebellar testing: No dysmetria or intention tremor. There is no truncal or gait ataxia.  Sensory exam: intact to light touch in the upper and lower extremities.  Gait, station and balance: He stands easily. No veering to one side is noted. No leaning to one side is noted. Posture is age-appropriate and stance is narrow based. Gait shows normal stride length and normal pace. No problems turning are noted.   Assessment and Plan:  In summary, Lacy Taglieri is a very pleasant 60 y.o.-year old male with an underlying medical history of MI, bradycardia, status post three-vessel CABG in June 2022, history of COVID, skin cancer, reflux disease, and obesity, whose history and physical exam are concerning for obstructive sleep apnea (OSA). I had a long chat with the patient about my findings and the diagnosis of OSA, its prognosis and treatment options. We talked about medical treatments, surgical interventions and non-pharmacological approaches. I explained in particular the risks and ramifications of  untreated moderate to severe OSA, especially with respect to developing cardiovascular disease down the Road, including congestive heart failure, difficult to treat hypertension, cardiac arrhythmias, or stroke. Even type 2 diabetes has, in part, been linked to untreated OSA. Symptoms of untreated OSA include daytime sleepiness, memory problems, mood irritability and mood disorder such as depression and anxiety, lack of energy, as well as recurrent headaches, especially morning  headaches. We talked about trying to maintain a healthy lifestyle in general, as well as the importance of weight control. We also talked about the importance of good sleep hygiene. I recommended the following at this time: sleep study.  I explained the difference between a laboratory attended sleep study versus home sleep test.  I explained the sleep test procedure to the patient and also outlined possible surgical and non-surgical treatment options of OSA, including the use of a custom-made dental device (which would require a referral to a specialist dentist or oral surgeon), upper airway surgical options, such as traditional UPPP or a novel less invasive surgical option in the form of Inspire hypoglossal nerve stimulation (which would involve a referral to an ENT surgeon). I also explained the CPAP treatment option to the patient, who indicated that he would be willing to try CPAP if the need arises. I explained the importance of being compliant with PAP treatment, not only for insurance purposes but primarily to improve His symptoms, and for the patient's long term health benefit, including to reduce His cardiovascular risks. I answered all his questions today and the patient was in agreement. I plan to see him back after the sleep study is completed and encouraged him to call with any interim questions, concerns, problems or updates.   Thank you very much for allowing me to participate in the care of this nice patient. If I can be  of any further assistance to you please do not hesitate to call me at [phone].  Sincerely,   Star Age, MD, PhD

## 2021-03-28 NOTE — Patient Instructions (Signed)

## 2021-03-29 ENCOUNTER — Other Ambulatory Visit (INDEPENDENT_AMBULATORY_CARE_PROVIDER_SITE_OTHER): Payer: 59

## 2021-03-29 ENCOUNTER — Ambulatory Visit: Payer: 59 | Admitting: Cardiology

## 2021-03-29 ENCOUNTER — Other Ambulatory Visit: Payer: Self-pay

## 2021-03-29 DIAGNOSIS — E7889 Other lipoprotein metabolism disorders: Secondary | ICD-10-CM | POA: Diagnosis not present

## 2021-03-29 LAB — LIPID PANEL
Cholesterol: 96 mg/dL (ref 0–200)
HDL: 37.7 mg/dL — ABNORMAL LOW (ref >39.00)
LDL Cholesterol: 50 mg/dL (ref 0–99)
NonHDL: 58.36
Total CHOL/HDL Ratio: 3
Triglycerides: 43 mg/dL (ref 0.0–149.0)
VLDL: 8.6 mg/dL (ref 0.0–40.0)

## 2021-04-01 NOTE — Progress Notes (Signed)
Cardiology Office Note   Date:  04/03/2021   ID:  Keith Franklin, DOB February 01, 1961, MRN [id number]  PCP:  Fredirick Lathe, PA-C  Cardiologist:   Enoc Getter Martinique, MD   Chief Complaint  Patient presents with   Coronary Artery Disease      History of Present Illness: Keith Franklin is a 60 y.o. male who presents for follow up CAD. He presented to [facility] in June 2022 with chest pain and was ruled in for ST elevated myocardial infarction.  He was taken emergently to the cardiac Cath Lab which showed a totally occluded circumflex.  We were able to reestablish flow in  the circumflex with POBA.  However, he was also noted to have severe LAD diagonal disease with moderate residual circumflex disease.  CABG was recommended.   He underwent CABG x3 on 10/03/2020.  While his lines were being placed and he was given sedation he had a bradycardic event with pulseless electrical activity cardiac arrest.  CPR was initiated.  It was performed for less than 2 minutes.  He had return of spontaneous circulation and was taken emergently to the operating room.    He received a LIMA-LAD, SVG to first diagonal and third obtuse marginal.   He was noted to have occasional monitor desaturations at night.  He was felt to potentially have OSA.  He continued to recover well.  He was discharged home in stable condition with his wife who is a retired Therapist, sports his daughter who is a Psychologist, sport and exercise on 10/07/2020.   He was seen in follow up in June and September. Progressing well. Noted insomnia. Sleep study planned. He has recently been seen by urology for hematuria with upcoming plans for cystoscopy and urethral dilation.  He has home sleep study this coming Wednesday. He notes his snoring has resolved with elevation of the head of his bed.  He has completed cardiac Rehab. Walking on treadmill for 2 miles/day. Planning to continue exercise at [facility]. Does note some tingling in his hands in the evening. Has keloid  formation in surgical incision. This is tender. No angina or dyspnea.   Past Medical History:  Diagnosis Date   Cancer ([place]) 2010   skin cancer, Basal Cell Carcinoma    COVID-19 07/2019   GERD (gastroesophageal reflux disease)    Myocardial infarction ([place]) 09/30/2020    Past Surgical History:  Procedure Laterality Date   CORONARY ARTERY BYPASS GRAFT N/A 10/03/2020   Procedure: CORONARY ARTERY BYPASS GRAFTING (CABG) X THREE, ON PUMP, USING LEFT INTERNAL MAMMARY ARTERY AND RIGHT GREATER SAPHENOUS ENDOSCOPIC VEIN HARVEST CONDUITS;  Surgeon: Melrose Nakayama, MD;  Location: [place];  Service: Open Heart Surgery;  Laterality: N/A;  will harvest left radial artery for conduit Swan   CORONARY/GRAFT ACUTE MI REVASCULARIZATION N/A 09/30/2020   Procedure: Coronary/Graft Acute MI Revascularization;  Surgeon: Martinique, Karilynn Carranza M, MD;  Location: [place] [facility];  Service: Cardiovascular;  Laterality: N/A;   LEFT HEART CATH AND CORONARY ANGIOGRAPHY N/A 09/30/2020   Procedure: LEFT HEART CATH AND CORONARY ANGIOGRAPHY;  Surgeon: Martinique, Twania Bujak M, MD;  Location: [place] [facility];  Service: Cardiovascular;  Laterality: N/A;   skin cancer removal       Current Outpatient Medications  Medication Sig Dispense Refill   acetaminophen (TYLENOL) 500 MG tablet Take 1,000 mg by mouth every 6 (six) hours as needed for moderate pain.     aspirin EC 81 MG EC tablet Take 1 tablet (81 mg total)  by mouth daily. Swallow whole.     atorvastatin (LIPITOR) 40 MG tablet Take 1 tablet (40 mg total) by mouth daily. 30 tablet 6   clopidogrel (PLAVIX) 75 MG tablet Take 1 tablet (75 mg total) by mouth daily. 90 tablet 1   famotidine (PEPCID) 10 MG tablet Take 10 mg by mouth at bedtime as needed for heartburn or indigestion.     metoprolol succinate (TOPROL XL) 25 MG 24 hr tablet Take 1 tablet (25 mg total) by mouth daily. 90 tablet 3   tamsulosin (FLOMAX) 0.4 MG CAPS capsule Take 0.4 mg by mouth daily.     traMADol (ULTRAM) 50  MG tablet Take 1 tablet (50 mg total) by mouth every 6 (six) hours as needed for severe pain. 28 tablet 0   No current facility-administered medications for this visit.    Allergies:   Patient has no known allergies.    Social History:  The patient  reports that he has never smoked. He has never used smokeless tobacco. He reports current alcohol use of about 1.0 standard drink per week. He reports that he does not use drugs.   Family History:  The patient's family history includes Cancer in his mother; Heart attack in his father.    ROS:  Please see the history of present illness.   Otherwise, review of systems are positive for none.   All other systems are reviewed and negative.    PHYSICAL EXAM: VS:  BP 138/80   Pulse 86   Ht 5\' 8"  (1.727 m)   Wt 238 lb 3.2 oz (108 kg)   SpO2 98%   BMI 36.22 kg/m  , BMI Body mass index is 36.22 kg/m. GEN: Well nourished, well developed, in no acute distress HEENT: normal Neck: no JVD, carotid bruits, or masses Cardiac: RRR; no murmurs, rubs, or gallops,no edema  Respiratory:  clear to auscultation bilaterally, normal work of breathing Chest with median sternotomy scar with diffuse keloid GI: soft, nontender, nondistended, + BS MS: no deformity or atrophy Skin: warm and dry, no rash Neuro:  Strength and sensation are intact Psych: euthymic mood, full affect   EKG:  EKG is not ordered today. The ekg ordered today demonstrates N/A   Recent Labs: 10/03/2020: ALT 36 10/04/2020: Magnesium 2.6 10/30/2020: BUN 20; Creatinine, Ser 0.97; Hemoglobin 14.0; Platelets 319; Potassium 3.7; Sodium 135    Lipid Panel    Component Value Date/Time   CHOL 96 03/29/2021 0812   TRIG 43.0 03/29/2021 0812   HDL 37.70 (L) 03/29/2021 0812   CHOLHDL 3 03/29/2021 0812   VLDL 8.6 03/29/2021 0812   LDLCALC 50 03/29/2021 0812   LDLCALC 32 01/13/2021 1516      Wt Readings from Last 3 Encounters:  04/03/21 238 lb 3.2 oz (108 kg)  03/28/21 241 lb 1.6 oz  (109.4 kg)  02/24/21 231 lb 14.8 oz (105.2 kg)      Other studies Reviewed: Additional studies/ records that were reviewed today include:   Echocardiogram 10/01/2020 IMPRESSIONS     1. Left ventricular ejection fraction, by estimation, is 45 to 50%. The  left ventricle has mildly decreased function. The left ventricle has no  regional wall motion abnormalities. There is mild concentric left  ventricular hypertrophy. Left ventricular  diastolic parameters are consistent with Grade I diastolic dysfunction  (impaired relaxation). There is moderate hypokinesis of the left  ventricular, mid-apical anterior wall. There is moderate hypokinesis of  the left ventricular, entire inferolateral  wall.   2.  Right ventricular systolic function is normal. The right ventricular  size is normal.   3. Left atrial size was mildly dilated.   4. The mitral valve is normal in structure. No evidence of mitral valve  regurgitation. No evidence of mitral stenosis.   5. The aortic valve is tricuspid. Aortic valve regurgitation is not  visualized. No aortic stenosis is present.   6. There is mild dilatation of the ascending aorta, measuring 42 mm.   7. The inferior vena cava is normal in size with greater than 50%  respiratory variability, suggesting right atrial pressure of 3 mmHg.      Cardiac catheterization 10/27/2020 Prox LAD to Mid LAD lesion is 90% stenosed. Mid LAD lesion is 90% stenosed. 1st Diag lesion is 40% stenosed. Mid Cx to Dist Cx lesion is 100% stenosed. RV Branch lesion is 90% stenosed. 2nd RPL lesion is 70% stenosed. The left ventricular systolic function is normal. LV end diastolic pressure is normal. The left ventricular ejection fraction is 50-55% by visual estimate. Post intervention, there is a 40% residual stenosis. Balloon angioplasty was performed using a BALLOON SAPPHIRE 2.5X12.   1. Severe 2 vessel obstructive CAD.    There is severe diffuse segmental disease in the  proximal to mid LAD 90%    100% occlusion of the LCx distally with thrombus 2. EF 50-55% with mid inferior HK 3. Normal LV EDP 4. Successful PCI of the distal LCx with POBA.   Plan: will resume IV heparin in 8 hours. Continue IV Aggrastat. Initiate beta blocker and high dose statin. Continue IV Ntg. Will consult CT surgery for CABG this admission given severe LAD disease. The LAD and OM 2 and 3 appear to be acceptable targets.   Diagnostic Dominance: Right     Intervention          ASSESSMENT AND PLAN:  1.  Coronary artery disease- - admitted with STEMI with emergent POBA of the LCx - Status post CABG x3 10/03/2020.  Had episodes of PEA arrest prior to CABG.   - he has made a good recovery. - Continue aspirin, Plavix, atorvastatin, metoprolol - will consolidate metoprolol to Toprol XL 25 mg daily -Heart healthy low-sodium diet -Increase physical activity as tolerated   2. Hyperlipidemia- LDL Cholesterol 50;  -  atorvastatin 40 mg   3. Essential hypertension  Well-controlled  Continue  metoprolol Heart healthy low-sodium diet-salty 6 given Increase physical activity as tolerated   4. Snoring/oxygen desaturation-previously noted postoperative nighttime desaturations.   Continue weight loss  Sleep study planned this week  5. Urethral stricture. Planned cystoscopy. Ok to hold antiplatelet therapy for procedure.    Current medicines are reviewed at length with the patient today.  The patient does not have concerns regarding medicines.  The following changes have been made:  changed metoprolol tartrate to succinate 25 mg daily  Labs/ tests ordered today include:  No orders of the defined types were placed in this encounter.        Disposition:   FU with me in 6 months  Signed, Jameil Whitmoyer Martinique, MD  04/03/2021 10:08 AM    [facility] [address] [place], [place], [address] Phone [phone], Fax [phone]

## 2021-04-03 ENCOUNTER — Encounter: Payer: Self-pay | Admitting: Cardiology

## 2021-04-03 ENCOUNTER — Other Ambulatory Visit: Payer: Self-pay | Admitting: Thoracic Surgery (Cardiothoracic Vascular Surgery)

## 2021-04-03 ENCOUNTER — Ambulatory Visit (INDEPENDENT_AMBULATORY_CARE_PROVIDER_SITE_OTHER): Payer: 59 | Admitting: Cardiology

## 2021-04-03 ENCOUNTER — Other Ambulatory Visit: Payer: Self-pay

## 2021-04-03 VITALS — BP 138/80 | HR 86 | Ht 68.0 in | Wt 238.2 lb

## 2021-04-03 DIAGNOSIS — I2583 Coronary atherosclerosis due to lipid rich plaque: Secondary | ICD-10-CM

## 2021-04-03 DIAGNOSIS — E782 Mixed hyperlipidemia: Secondary | ICD-10-CM

## 2021-04-03 DIAGNOSIS — G8918 Other acute postprocedural pain: Secondary | ICD-10-CM | POA: Diagnosis not present

## 2021-04-03 DIAGNOSIS — I2510 Atherosclerotic heart disease of native coronary artery without angina pectoris: Secondary | ICD-10-CM

## 2021-04-03 DIAGNOSIS — I10 Essential (primary) hypertension: Secondary | ICD-10-CM

## 2021-04-03 DIAGNOSIS — Z951 Presence of aortocoronary bypass graft: Secondary | ICD-10-CM | POA: Diagnosis not present

## 2021-04-03 MED ORDER — TRAMADOL HCL 50 MG PO TABS: 50.0000 mg | ORAL_TABLET | Freq: Four times a day (QID) | ORAL | 0 refills | Status: DC | PRN

## 2021-04-03 MED ORDER — METOPROLOL SUCCINATE ER 25 MG PO TB24: 25.0000 mg | ORAL_TABLET | Freq: Every day | ORAL | 3 refills | Status: DC

## 2021-04-04 ENCOUNTER — Other Ambulatory Visit: Payer: Self-pay

## 2021-04-04 ENCOUNTER — Encounter: Payer: Self-pay | Admitting: Cardiology

## 2021-04-04 DIAGNOSIS — G8918 Other acute postprocedural pain: Secondary | ICD-10-CM

## 2021-04-04 MED ORDER — TRAMADOL HCL 50 MG PO TABS: 50.0000 mg | ORAL_TABLET | Freq: Four times a day (QID) | ORAL | 0 refills | Status: DC | PRN

## 2021-04-04 NOTE — Patient Instructions (Addendum)
DUE TO COVID-19 ONLY ONE VISITOR IS ALLOWED TO COME WITH YOU AND STAY IN THE WAITING ROOM ONLY DURING PRE OP AND PROCEDURE.   **NO VISITORS ARE ALLOWED IN THE SHORT STAY AREA OR RECOVERY ROOM!!**       Your procedure is scheduled on: 04/10/21   Report to [facility] Main Entrance    Report to admitting at 5:15 AM   Call this number if you have problems the morning of surgery [phone]   Do not eat food :After Midnight.   May have liquids until 4:30 AM day of surgery  CLEAR LIQUID DIET  Foods Allowed                                                                     Foods Excluded  Water, Black Coffee and tea (no milk or creamer)           liquids that you cannot  Plain Jell-O in any flavor  (No red)                                    see through such as: Fruit ices (not with fruit pulp)                                             milk, soups, orange juice              Iced Popsicles (No red)                                               All solid food                                   Apple juices Sports drinks like Gatorade (No red) Lightly seasoned clear broth or consume(fat free) Sugar (no milk or creamer)    Oral Hygiene is also important to reduce your risk of infection.                                    Remember - BRUSH YOUR TEETH THE MORNING OF SURGERY WITH YOUR REGULAR TOOTHPASTE   Take these medicines the morning of surgery with A SIP OF WATER: Tylenol, Lipitor, Pepcid, Metoprolol, Flomax, Tramadol.   Hold Plavix 5 days and Aspirin 5 days before your procedure as instructed.                               You may not have any metal on your body including jewelry, and body piercing             Do not wear lotions, powders, cologne, or deodorant              Men may  shave face and neck.   Do not bring valuables to the hospital. [place].    Patients discharged on the day of surgery will not be allowed  to drive home.   Special Instructions: Bring a copy of your healthcare power of attorney and living will documents         the day of surgery if you haven't scanned them before.              Please read over the following fact sheets you were given: IF YOU HAVE QUESTIONS ABOUT YOUR PRE-OP INSTRUCTIONS PLEASE CALL [facility] - Preparing for Surgery Before surgery, you can play an important role.  Because skin is not sterile, your skin needs to be as free of germs as possible.  You can reduce the number of germs on your skin by washing with CHG (chlorahexidine gluconate) soap before surgery.  CHG is an antiseptic cleaner which kills germs and bonds with the skin to continue killing germs even after washing. Please DO NOT use if you have an allergy to CHG or antibacterial soaps.  If your skin becomes reddened/irritated stop using the CHG and inform your nurse when you arrive at Short Stay. Do not shave (including legs and underarms) for at least 48 hours prior to the first CHG shower.  You may shave your face/neck.  Please follow these instructions carefully:  1.  Shower with CHG Soap the night before surgery and the  morning of surgery.  2.  If you choose to wash your hair, wash your hair first as usual with your normal  shampoo.  3.  After you shampoo, rinse your hair and body thoroughly to remove the shampoo.                             4.  Use CHG as you would any other liquid soap.  You can apply chg directly to the skin and wash.  Gently with a scrungie or clean washcloth.  5.  Apply the CHG Soap to your body ONLY FROM THE NECK DOWN.   Do   not use on face/ open                           Wound or open sores. Avoid contact with eyes, ears mouth and   genitals (private parts).                       Wash face,  Genitals (private parts) with your normal soap.             6.  Wash thoroughly, paying special attention to the area where your    surgery  will be performed.  7.   Thoroughly rinse your body with warm water from the neck down.  8.  DO NOT shower/wash with your normal soap after using and rinsing off the CHG Soap.                9.  Pat yourself dry with a clean towel.            10.  Wear clean pajamas.            11.  Place clean sheets on your bed the night of your first  shower and do not  sleep with pets. Day of Surgery : Do not apply any lotions/deodorants the morning of surgery.  Please wear clean clothes to the hospital/surgery center.  FAILURE TO FOLLOW THESE INSTRUCTIONS MAY RESULT IN THE CANCELLATION OF YOUR SURGERY  PATIENT SIGNATURE_________________________________  NURSE SIGNATURE__________________________________  ________________________________________________________________________

## 2021-04-04 NOTE — Progress Notes (Addendum)
COVID swab appointment: n/a  COVID Vaccine Completed: yes x3 Date COVID Vaccine completed: 07/23/19, 09/16/19 Has received booster: 04/01/20 COVID vaccine manufacturer: [place]      Date of COVID positive in last 90 days: no  PCP - Theresa Duty, PA Cardiologist - Peter Martinique, MD  Cardiac clearance Coletta Memos 03/17/21 in Epic  Chest x-ray - 12/06/20 Epic EKG - 10/27/20 Epic Stress Test - n/a ECHO - 02/15/21 Epic Cardiac Cath - 09/30/20 Epic Pacemaker/ICD device last checked: n/a Spinal Cord Stimulator: n/a  Sleep Study - patient doing home test tonight CPAP -   Fasting Blood Sugar - n/a Checks Blood Sugar _____ times a day  Blood Thinner Instructions: Plavix, hold 5 days Aspirin Instructions: ASA 81, hold 5 days Last Dose: 04/04/21  Activity level: Can go up a flight of stairs and perform activities of daily living without stopping and without symptoms of chest pain or shortness of breath.    Anesthesia review: CABG June 2022, CAD, HTN  Patient denies shortness of breath, fever, cough and chest pain at PAT appointment   Patient verbalized understanding of instructions that were given to them at the PAT appointment. Patient was also instructed that they will need to review over the PAT instructions again at home before surgery.

## 2021-04-05 ENCOUNTER — Encounter (HOSPITAL_COMMUNITY): Payer: Self-pay

## 2021-04-05 ENCOUNTER — Ambulatory Visit (INDEPENDENT_AMBULATORY_CARE_PROVIDER_SITE_OTHER): Payer: 59 | Admitting: Neurology

## 2021-04-05 ENCOUNTER — Encounter (HOSPITAL_COMMUNITY)
Admission: RE | Admit: 2021-04-05 | Discharge: 2021-04-05 | Disposition: A | Discharge status: Home / Self Care | Payer: 59 | Source: Ambulatory Visit | Attending: Urology | Admitting: Urology

## 2021-04-05 VITALS — BP 136/88 | HR 66 | Temp 98.8°F | Resp 18 | Ht 68.0 in | Wt 232.6 lb

## 2021-04-05 DIAGNOSIS — Z951 Presence of aortocoronary bypass graft: Secondary | ICD-10-CM | POA: Insufficient documentation

## 2021-04-05 DIAGNOSIS — Z7982 Long term (current) use of aspirin: Secondary | ICD-10-CM | POA: Diagnosis not present

## 2021-04-05 DIAGNOSIS — R0681 Apnea, not elsewhere classified: Secondary | ICD-10-CM

## 2021-04-05 DIAGNOSIS — Z01812 Encounter for preprocedural laboratory examination: Secondary | ICD-10-CM | POA: Insufficient documentation

## 2021-04-05 DIAGNOSIS — N35919 Unspecified urethral stricture, male, unspecified site: Secondary | ICD-10-CM | POA: Diagnosis not present

## 2021-04-05 DIAGNOSIS — K219 Gastro-esophageal reflux disease without esophagitis: Secondary | ICD-10-CM | POA: Insufficient documentation

## 2021-04-05 DIAGNOSIS — Z7902 Long term (current) use of antithrombotics/antiplatelets: Secondary | ICD-10-CM | POA: Insufficient documentation

## 2021-04-05 DIAGNOSIS — I2121 ST elevation (STEMI) myocardial infarction involving left circumflex coronary artery: Secondary | ICD-10-CM | POA: Insufficient documentation

## 2021-04-05 DIAGNOSIS — R0683 Snoring: Secondary | ICD-10-CM

## 2021-04-05 DIAGNOSIS — G4734 Idiopathic sleep related nonobstructive alveolar hypoventilation: Secondary | ICD-10-CM

## 2021-04-05 DIAGNOSIS — Z8616 Personal history of COVID-19: Secondary | ICD-10-CM | POA: Diagnosis not present

## 2021-04-05 DIAGNOSIS — R351 Nocturia: Secondary | ICD-10-CM

## 2021-04-05 DIAGNOSIS — G4733 Obstructive sleep apnea (adult) (pediatric): Secondary | ICD-10-CM | POA: Diagnosis not present

## 2021-04-05 DIAGNOSIS — G4719 Other hypersomnia: Secondary | ICD-10-CM

## 2021-04-05 DIAGNOSIS — I252 Old myocardial infarction: Secondary | ICD-10-CM

## 2021-04-05 DIAGNOSIS — E669 Obesity, unspecified: Secondary | ICD-10-CM

## 2021-04-05 HISTORY — DX: Personal history of urinary calculi: Z87.442

## 2021-04-05 LAB — BASIC METABOLIC PANEL
Anion gap: 6 (ref 5–15)
BUN: 19 mg/dL (ref 8–23)
CO2: 25 mmol/L (ref 22–32)
Calcium: 8.9 mg/dL (ref 8.9–10.3)
Chloride: 106 mmol/L (ref 98–111)
Creatinine, Ser: 0.93 mg/dL (ref 0.61–1.24)
GFR, Estimated: >60 mL/min (ref >60)
Glucose, Bld: 105 mg/dL — ABNORMAL HIGH (ref 70–99)
Potassium: 4.1 mmol/L (ref 3.5–5.1)
Sodium: 137 mmol/L (ref 135–145)

## 2021-04-05 LAB — CBC
HCT: 44.7 % (ref 39.0–52.0)
Hemoglobin: 14.8 g/dL (ref 13.0–17.0)
MCH: 31 pg (ref 26.0–34.0)
MCHC: 33.1 g/dL (ref 30.0–36.0)
MCV: 93.5 fL (ref 80.0–100.0)
Platelets: 268 10*3/uL (ref 150–400)
RBC: 4.78 MIL/uL (ref 4.22–5.81)
RDW: 13.1 % (ref 11.5–15.5)
WBC: 7.7 10*3/uL (ref 4.0–10.5)
nRBC: 0 % (ref 0.0–0.2)

## 2021-04-06 NOTE — Anesthesia Preprocedure Evaluation (Addendum)
Anesthesia Evaluation  Patient identified by MRN, date of birth, ID band Patient awake    Reviewed: Allergy & Precautions, H&P , NPO status , Patient's Chart, lab work & pertinent test results  Airway Mallampati: III  TM Distance: >3 FB Neck ROM: Full    Dental no notable dental hx.    Pulmonary neg pulmonary ROS,    Pulmonary exam normal breath sounds clear to auscultation       Cardiovascular + Past MI (emergent CABG in 09/2020)  Normal cardiovascular exam Rhythm:Regular Rate:Normal     Neuro/Psych negative neurological ROS  negative psych ROS   GI/Hepatic Neg liver ROS, GERD  ,  Endo/Other  negative endocrine ROS  Renal/GU negative Renal ROS  negative genitourinary   Musculoskeletal negative musculoskeletal ROS (+)   Abdominal   Peds negative pediatric ROS (+)  Hematology negative hematology ROS (+)   Anesthesia Other Findings   Reproductive/Obstetrics negative OB ROS                            Anesthesia Physical Anesthesia Plan  ASA: 3  Anesthesia Plan: General   Post-op Pain Management: Tylenol PO (pre-op)   Induction: Intravenous  PONV Risk Score and Plan: 2 and Treatment may vary due to age or medical condition  Airway Management Planned: LMA  Additional Equipment: None  Intra-op Plan:   Post-operative Plan: Extubation in OR  Informed Consent: I have reviewed the patients History and Physical, chart, labs and discussed the procedure including the risks, benefits and alternatives for the proposed anesthesia with the patient or authorized representative who has indicated his/her understanding and acceptance.       Plan Discussed with: Anesthesiologist  Anesthesia Plan Comments: (See PAT note 04/05/2021, [name], PA-C)      Anesthesia Quick Evaluation

## 2021-04-06 NOTE — Progress Notes (Signed)
Anesthesia Chart Review   Case: [id number] Date/Time: 04/10/21 0715   Procedure: CYSTOSCOPY WITH OPTILUME URETHRAL DILATATION   Anesthesia type: General   Pre-op diagnosis: URETHRAL STRICTURE   Location: [place] / [facility]   Surgeons: Keith Mallow, MD       DISCUSSION:60 y.o. never smoker with h/o GERD, CAD (CABG x3 10/03/2020), urethral stricture scheduled for above procedure 04/10/2021 with Dr. Link Franklin.   Pt last seen by cardiology 04/03/2021. S/p CABG 09/2020. Stable at this visit with 6 month follow up recommended.   Per cardiology preoperative evaluation 03/17/2021, "Chart reviewed as part of pre-operative protocol coverage. Given past medical history and time since last visit, based on ACC/AHA guidelines, Keith Franklin would be at acceptable risk for the planned procedure without further cardiovascular testing.    His aspirin may be held for 5 days prior to his procedure and his Plavix may be held for 7 days prior to his procedure.  Please resume dual antiplatelet therapy as soon as hemostasis is achieved at the discretion of the surgeon."  Anticipate pt can proceed with planned procedure barring acute status change.   VS: BP 136/88   Pulse 66   Temp 37.1 C (Oral)   Resp 18   Ht 5\' 8"  (1.727 Franklin)   Wt 105.5 kg   SpO2 97%   BMI 35.37 kg/Franklin   PROVIDERS: Allwardt, Randa Evens, PA-C is PCP   Martinique, Peter, MD is Cardiologist  LABS: Labs reviewed: Acceptable for surgery. (all labs ordered are listed, but only abnormal results are displayed)  Labs Reviewed  BASIC METABOLIC PANEL - Abnormal; Notable for the following components:      Result Value   Glucose, Bld 105 (*)    All other components within normal limits  CBC     IMAGES:   EKG: 10/27/2020 Rate 83 bpm  Poor data quality, interpretation may be adversely affected Normal sinus rhythm  Possible left atrial enlargement Left axis deviation Cannot rule out anterior infarct, age undetermined T wave  abnormality, consider lateral ischemia   CV: Echo 02/15/2021 1. Left ventricular ejection fraction, by estimation, is 40 to 45%. The  left ventricle has mildly decreased function. The left ventricle  demonstrates global hypokinesis. Left ventricular diastolic parameters are  consistent with Grade II diastolic  dysfunction (pseudonormalization). The average left ventricular global  longitudinal strain is -11.6 %. The global longitudinal strain is  abnormal.   2. Right ventricular systolic function is mildly reduced. The right  ventricular size is moderately enlarged.   3. Left atrial size was mildly dilated.   4. Right atrial size was moderately dilated.   5. The mitral valve is normal in structure. Trivial mitral valve  regurgitation. No evidence of mitral stenosis.   6. The aortic valve is normal in structure. Aortic valve regurgitation is  not visualized. No aortic stenosis is present.   7. Aortic dilatation noted. There is mild dilatation of the aortic root,  measuring 40 mm. There is mild dilatation of the ascending aorta,  measuring 41 mm.  Past Medical History:  Diagnosis Date   Cancer ([place]) 2010   skin cancer, Basal Cell Carcinoma    COVID-19 07/2019   GERD (gastroesophageal reflux disease)    History of kidney stones    Myocardial infarction ([place]) 09/30/2020    Past Surgical History:  Procedure Laterality Date   CORONARY ARTERY BYPASS GRAFT N/A 10/03/2020   Procedure: CORONARY ARTERY BYPASS GRAFTING (CABG) X THREE, ON PUMP, USING  LEFT INTERNAL MAMMARY ARTERY AND RIGHT GREATER SAPHENOUS ENDOSCOPIC VEIN HARVEST CONDUITS;  Surgeon: Keith Nakayama, MD;  Location: [place];  Service: Open Heart Surgery;  Laterality: N/A;  will harvest left radial artery for conduit [place]   CORONARY/GRAFT ACUTE MI REVASCULARIZATION N/A 09/30/2020   Procedure: Coronary/Graft Acute MI Revascularization;  Surgeon: Martinique, Peter M, MD;  Location: [facility];  Service: Cardiovascular;   Laterality: N/A;   LEFT HEART CATH AND CORONARY ANGIOGRAPHY N/A 09/30/2020   Procedure: LEFT HEART CATH AND CORONARY ANGIOGRAPHY;  Surgeon: Martinique, Peter M, MD;  Location: [place] [facility];  Service: Cardiovascular;  Laterality: N/A;   skin cancer removal     TONSILLECTOMY     WISDOM TOOTH EXTRACTION      MEDICATIONS:  acetaminophen (TYLENOL) 500 MG tablet   aspirin EC 81 MG EC tablet   atorvastatin (LIPITOR) 40 MG tablet   clopidogrel (PLAVIX) 75 MG tablet   famotidine (PEPCID) 10 MG tablet   metoprolol succinate (TOPROL XL) 25 MG 24 hr tablet   tamsulosin (FLOMAX) 0.4 MG CAPS capsule   traMADol (ULTRAM) 50 MG tablet   No current facility-administered medications for this encounter.    Keith Felix Ward, PA-C [facility] Pre-Surgical Testing [phone]

## 2021-04-10 ENCOUNTER — Ambulatory Visit (HOSPITAL_COMMUNITY): Payer: 59

## 2021-04-10 ENCOUNTER — Ambulatory Visit (HOSPITAL_COMMUNITY): Payer: 59 | Admitting: Anesthesiology

## 2021-04-10 ENCOUNTER — Other Ambulatory Visit: Payer: Self-pay

## 2021-04-10 ENCOUNTER — Ambulatory Visit (HOSPITAL_COMMUNITY): Payer: 59 | Admitting: Physician Assistant

## 2021-04-10 ENCOUNTER — Encounter (HOSPITAL_COMMUNITY)
Admission: RE | Disposition: A | Discharge status: Home / Self Care | Payer: Self-pay | Source: Home / Self Care | Attending: Urology

## 2021-04-10 ENCOUNTER — Ambulatory Visit (HOSPITAL_COMMUNITY)
Admission: RE | Admit: 2021-04-10 | Discharge: 2021-04-10 | Disposition: A | Discharge status: Home / Self Care | Payer: 59 | Attending: Urology | Admitting: Urology

## 2021-04-10 ENCOUNTER — Encounter (HOSPITAL_COMMUNITY): Payer: Self-pay | Admitting: Urology

## 2021-04-10 DIAGNOSIS — I252 Old myocardial infarction: Secondary | ICD-10-CM | POA: Insufficient documentation

## 2021-04-10 DIAGNOSIS — Z8674 Personal history of sudden cardiac arrest: Secondary | ICD-10-CM | POA: Insufficient documentation

## 2021-04-10 DIAGNOSIS — Z951 Presence of aortocoronary bypass graft: Secondary | ICD-10-CM | POA: Diagnosis not present

## 2021-04-10 DIAGNOSIS — I2121 ST elevation (STEMI) myocardial infarction involving left circumflex coronary artery: Secondary | ICD-10-CM

## 2021-04-10 DIAGNOSIS — K219 Gastro-esophageal reflux disease without esophagitis: Secondary | ICD-10-CM | POA: Insufficient documentation

## 2021-04-10 DIAGNOSIS — R351 Nocturia: Secondary | ICD-10-CM | POA: Diagnosis not present

## 2021-04-10 DIAGNOSIS — R310 Gross hematuria: Secondary | ICD-10-CM | POA: Diagnosis present

## 2021-04-10 DIAGNOSIS — N401 Enlarged prostate with lower urinary tract symptoms: Secondary | ICD-10-CM | POA: Diagnosis not present

## 2021-04-10 DIAGNOSIS — N35919 Unspecified urethral stricture, male, unspecified site: Secondary | ICD-10-CM | POA: Insufficient documentation

## 2021-04-10 HISTORY — PX: CYSTOSCOPY WITH URETHRAL DILATATION: SHX5125

## 2021-04-10 LAB — APTT: aPTT: 27 seconds (ref 24–36)

## 2021-04-10 LAB — PROTIME-INR
INR: 1 (ref 0.8–1.2)
Prothrombin Time: 13.3 seconds (ref 11.4–15.2)

## 2021-04-10 SURGERY — CYSTOSCOPY, WITH URETHRAL DILATION
Anesthesia: General

## 2021-04-10 MED ORDER — ONDANSETRON HCL 4 MG/2ML IJ SOLN
INTRAMUSCULAR | Status: AC
  Filled 2021-04-10: qty 2

## 2021-04-10 MED ORDER — SODIUM CHLORIDE 0.9 % IR SOLN
Status: DC | PRN
  Administered 2021-04-10: 3000 mL

## 2021-04-10 MED ORDER — ONDANSETRON HCL 4 MG/2ML IJ SOLN
INTRAMUSCULAR | Status: DC | PRN
  Administered 2021-04-10: 4 mg via INTRAVENOUS

## 2021-04-10 MED ORDER — MIDAZOLAM HCL 2 MG/2ML IJ SOLN
INTRAMUSCULAR | Status: AC
  Filled 2021-04-10: qty 2

## 2021-04-10 MED ORDER — FENTANYL CITRATE PF 50 MCG/ML IJ SOSY: 25.0000 ug | PREFILLED_SYRINGE | INTRAMUSCULAR | Status: DC | PRN

## 2021-04-10 MED ORDER — FENTANYL CITRATE (PF) 100 MCG/2ML IJ SOLN
INTRAMUSCULAR | Status: AC
  Filled 2021-04-10: qty 2

## 2021-04-10 MED ORDER — OXYCODONE HCL 5 MG PO TABS: 5.0000 mg | ORAL_TABLET | Freq: Once | ORAL | Status: DC | PRN

## 2021-04-10 MED ORDER — LACTATED RINGERS IV SOLN: INTRAVENOUS | Status: DC

## 2021-04-10 MED ORDER — DEXAMETHASONE SODIUM PHOSPHATE 10 MG/ML IJ SOLN
INTRAMUSCULAR | Status: AC
  Filled 2021-04-10: qty 1

## 2021-04-10 MED ORDER — LIDOCAINE 2% (20 MG/ML) 5 ML SYRINGE
INTRAMUSCULAR | Status: DC | PRN
  Administered 2021-04-10: 50 mg via INTRAVENOUS

## 2021-04-10 MED ORDER — ONDANSETRON HCL 4 MG/2ML IJ SOLN: 4.0000 mg | Freq: Once | INTRAMUSCULAR | Status: DC | PRN

## 2021-04-10 MED ORDER — PROPOFOL 10 MG/ML IV BOLUS
INTRAVENOUS | Status: AC
  Filled 2021-04-10: qty 20

## 2021-04-10 MED ORDER — OXYCODONE HCL 5 MG/5ML PO SOLN: 5.0000 mg | Freq: Once | ORAL | Status: DC | PRN

## 2021-04-10 MED ORDER — DEXAMETHASONE SODIUM PHOSPHATE 10 MG/ML IJ SOLN
INTRAMUSCULAR | Status: DC | PRN
Start: 2021-04-10 — End: 2021-04-10
  Administered 2021-04-10: 10 mg via INTRAVENOUS

## 2021-04-10 MED ORDER — CEFAZOLIN SODIUM-DEXTROSE 2-4 GM/100ML-% IV SOLN
2.0000 g | INTRAVENOUS | Status: AC
  Administered 2021-04-10: 2 g via INTRAVENOUS
  Filled 2021-04-10: qty 100

## 2021-04-10 MED ORDER — PHENYLEPHRINE 40 MCG/ML (10ML) SYRINGE FOR IV PUSH (FOR BLOOD PRESSURE SUPPORT)
PREFILLED_SYRINGE | INTRAVENOUS | Status: DC | PRN
  Administered 2021-04-10: 80 ug via INTRAVENOUS

## 2021-04-10 MED ORDER — STERILE WATER FOR IRRIGATION IR SOLN
Status: DC | PRN
  Administered 2021-04-10: 1000 mL

## 2021-04-10 MED ORDER — FENTANYL CITRATE (PF) 100 MCG/2ML IJ SOLN
INTRAMUSCULAR | Status: DC | PRN
  Administered 2021-04-10: 50 ug via INTRAVENOUS

## 2021-04-10 MED ORDER — PROPOFOL 500 MG/50ML IV EMUL
INTRAVENOUS | Status: DC | PRN
  Administered 2021-04-10: 160 mg via INTRAVENOUS

## 2021-04-10 MED ORDER — ORAL CARE MOUTH RINSE: 15.0000 mL | Freq: Once | OROMUCOSAL | Status: AC

## 2021-04-10 MED ORDER — MIDAZOLAM HCL 2 MG/2ML IJ SOLN
INTRAMUSCULAR | Status: DC | PRN
  Administered 2021-04-10: 2 mg via INTRAVENOUS

## 2021-04-10 MED ORDER — PHENYLEPHRINE 40 MCG/ML (10ML) SYRINGE FOR IV PUSH (FOR BLOOD PRESSURE SUPPORT)
PREFILLED_SYRINGE | INTRAVENOUS | Status: AC
  Filled 2021-04-10: qty 10

## 2021-04-10 MED ORDER — ACETAMINOPHEN 500 MG PO TABS
1000.0000 mg | ORAL_TABLET | Freq: Once | ORAL | Status: AC
  Administered 2021-04-10: 1000 mg via ORAL
  Filled 2021-04-10: qty 2

## 2021-04-10 MED ORDER — CHLORHEXIDINE GLUCONATE 0.12 % MT SOLN
15.0000 mL | Freq: Once | OROMUCOSAL | Status: AC
  Administered 2021-04-10: 15 mL via OROMUCOSAL

## 2021-04-10 SURGICAL SUPPLY — 25 items
BAG URINE DRAIN 2000ML AR STRL (UROLOGICAL SUPPLIES) ×2 IMPLANT
BALLN NEPHROSTOMY (BALLOONS)
BALLN OPTILUME DCB 30X5X75 (BALLOONS) ×2
BALLOON NEPHROSTOMY (BALLOONS) IMPLANT
BALLOON OPTILUME DCB 30X5X75 (BALLOONS) IMPLANT
CATH FOLEY 2W COUNCIL 20FR 5CC (CATHETERS) IMPLANT
CATH FOLEY 2WAY SLVR  5CC 18FR (CATHETERS) ×1
CATH FOLEY 2WAY SLVR 5CC 18FR (CATHETERS) IMPLANT
CATH ROBINSON RED A/P 14FR (CATHETERS) ×1 IMPLANT
CATH URET 5FR 28IN CONE TIP (BALLOONS)
CATH URET 5FR 70CM CONE TIP (BALLOONS) IMPLANT
CATH URETL OPEN END 6FR 70 (CATHETERS) IMPLANT
CLOTH BEACON ORANGE TIMEOUT ST (SAFETY) ×2 IMPLANT
DEVICE INFLATION ATRION QL4015 (MISCELLANEOUS) ×1 IMPLANT
GLOVE SURG ENC MOIS LTX SZ7.5 (GLOVE) ×2 IMPLANT
GOWN STRL REUS W/TWL XL LVL3 (GOWN DISPOSABLE) ×2 IMPLANT
GUIDEWIRE ANG ZIPWIRE 038X150 (WIRE) IMPLANT
GUIDEWIRE STR DUAL SENSOR (WIRE) ×2 IMPLANT
IV NS IRRIG 3000ML ARTHROMATIC (IV SOLUTION) ×1 IMPLANT
MANIFOLD NEPTUNE II (INSTRUMENTS) IMPLANT
NS IRRIG 1000ML POUR BTL (IV SOLUTION) IMPLANT
PACK CYSTO (CUSTOM PROCEDURE TRAY) ×2 IMPLANT
PENCIL SMOKE EVACUATOR (MISCELLANEOUS) IMPLANT
WATER STERILE IRR 1000ML POUR (IV SOLUTION) ×1 IMPLANT
WATER STERILE IRR 3000ML UROMA (IV SOLUTION) ×1 IMPLANT

## 2021-04-10 NOTE — Interval H&P Note (Signed)
History and Physical Interval Note:  04/10/2021 7:27 AM  Keith Franklin  has presented today for surgery, with the diagnosis of URETHRAL STRICTURE.  The various methods of treatment have been discussed with the patient and family. After consideration of risks, benefits and other options for treatment, the patient has consented to  Procedure(s): CYSTOSCOPY WITH OPTILUME URETHRAL DILATATION (N/A) as a surgical intervention.  The patient's history has been reviewed, patient examined, no change in status, stable for surgery.  I have reviewed the patient's chart and labs.  Questions were answered to the patient's satisfaction.     Marton Redwood, III

## 2021-04-10 NOTE — H&P (Signed)
CC: AUA Questions Scoring.  HPI: Keith Franklin is a 60 year-old male established patient who is here AUA Questions.      CC/HPI: CC: Lower urinary tract symptoms, gross hematuria  HPI:  01/10/2021  Patient had cardiac arrest and underwent CABG on 10/03/2020. He states there was difficult Foley catheter placement during his surgery and ultimately a coude catheter was passed into the bladder. Since then, he has had some voiding complaints including weak stream, straining to void, nocturia x3. He did not have these problems before. No medication for his prostate. He also complains about some suprapubic pain and penile pain /dysuria during voiding. No recent PSA. He also had an episode of gross hematuria back in July. He underwent a CT scan of the abdomen and pelvis without contrast that showed no renal ureteral calculi and no findings to account for the patient's hematuria. He does have persistent microscopic hematuria today.   03/14/2021  Patient completed the doxycycline and remains on Flomax. Symptoms much improved. He has no complaints today. Hematuria has resolved. No microscopic hematuria today. He presents for cystoscopy. PSA at his primary care physician's office in September was 0.54.     AUA Symptom Score: He never has the sensation of not emptying his bladder completely after finishing urinating. Less than 20% of the time he has to urinate again fewer than two hours after he has finished urinating. He does not have to stop and start again several times when he urinates. He never finds it difficult to postpone urination. He never has a weak urinary stream. He never has to push or strain to begin urination. He has to get up to urinate 2 times from the time he goes to bed until the time he gets up in the morning.   Calculated AUA Symptom Score: 3    ALLERGIES: No Known Drug Allergies    MEDICATIONS: Aspirin  Metoprolol Tartrate  Plavix  Tamsulosin Hcl 0.4 mg capsule 1 capsule PO  Daily  Atorvastatin Calcium  Lopressor  Pepcid  Tramadol Hcl     GU PSH: None     PSH Notes: MOHS   NON-GU PSH: Coronary Artery Bypass Grafting     GU PMH: BPH w/LUTS - 01/10/2021 Chronic prostatitis - 01/10/2021 Encounter for Prostate Cancer screening - 01/10/2021 Gross hematuria - 01/10/2021 Nocturia - 01/10/2021 Straining on Urination - 01/10/2021 Weak Urinary Stream - 01/10/2021    NON-GU PMH: GERD Heart disease, unspecified Myocardial Infarction Other heart failure Skin Cancer, History    FAMILY HISTORY: Breast Cancer - Runs in Family Heart Disease - Father Lung Cancer - Mother stroke - Father   SOCIAL HISTORY: Marital Status: Married Preferred Language: English; Race: White Current Smoking Status: Patient has never smoked.   Tobacco Use Assessment Completed: Used Tobacco in last 30 days? Drinks 2 caffeinated drinks per day.    REVIEW OF SYSTEMS:    GU Review Male:   Patient reports get up at night to urinate and stream starts and stops. Patient denies frequent urination, hard to postpone urination, burning/ pain with urination, leakage of urine, trouble starting your stream, have to strain to urinate , erection problems, and penile pain.  Gastrointestinal (Upper):   Patient denies nausea, vomiting, and indigestion/ heartburn.  Gastrointestinal (Lower):   Patient denies diarrhea and constipation.  Constitutional:   Patient denies night sweats, weight loss, fatigue, and fever.  Skin:   Patient denies skin rash/ lesion and itching.  Eyes:   Patient denies blurred vision and double vision.  Ears/ Nose/ Throat:   Patient denies sore throat and sinus problems.  Hematologic/Lymphatic:   Patient denies swollen glands and easy bruising.  Cardiovascular:   Patient denies leg swelling and chest pains.  Respiratory:   Patient denies cough and shortness of breath.  Endocrine:   Patient denies excessive thirst.  Musculoskeletal:   Patient denies back pain and joint pain.   Neurological:   Patient denies headaches and dizziness.  Psychologic:   Patient denies depression and anxiety.   VITAL SIGNS: None   GU PHYSICAL EXAMINATION:    Penis: Circumcised, no warts, no cracks. No dorsal Peyronie's plaques, no left corporal Peyronie's plaques, no right corporal Peyronie's plaques, no scarring, no warts. No balanitis, no meatal stenosis.   MULTI-SYSTEM PHYSICAL EXAMINATION:       Complexity of Data:  Source Of History:  Patient  Lab Test Review:   PSA  Records Review:   Previous Patient Records  Urine Test Review:   Urinalysis   PROCEDURES:         Flexible Cystoscopy - 52000  Risks, benefits, and some of the potential complications of the procedure were discussed at length with the patient including infection, bleeding, voiding discomfort, urinary retention, fever, chills, sepsis, and others. All questions were answered. Informed consent was obtained. Antibiotic prophylaxis was given. Sterile technique and intraurethral analgesia were used.  Meatus:  Normal size. Normal location. Normal condition.  Urethra:  Approximately 10 French pendulous urethral stricture      The lower urinary tract was carefully examined. The procedure was well-tolerated and without complications. Antibiotic instructions were given. Instructions were given to call the office immediately for bloody urine, difficulty urinating, urinary retention, painful or frequent urination, fever, chills, nausea, vomiting or other illness. The patient stated that he understood these instructions and would comply with them.         Urinalysis w/Scope Dipstick Dipstick Cont'd Micro  Color: Yellow Bilirubin: Neg mg/dL WBC/hpf: NS (Not Seen)  Appearance: Clear Ketones: Neg mg/dL RBC/hpf: 0 - 2/hpf  Specific Gravity: 1.025 Blood: 1+ ery/uL Bacteria: Rare (0-9/hpf)  pH: 5.5 Protein: Neg mg/dL Cystals: NS (Not Seen)  Glucose: Neg mg/dL Urobilinogen: 0.2 mg/dL Casts: NS (Not Seen)    Nitrites: Neg  Trichomonas: Not Present    Leukocyte Esterase: Neg leu/uL Mucous: Not Present      Epithelial Cells: NS (Not Seen)      Yeast: NS (Not Seen)      Sperm: Not Present    ASSESSMENT:      ICD-10 Details  1 GU:   BPH w/LUTS - N40.1 Chronic, Stable  2   Nocturia - R35.1 Chronic, Stable  3   Gross hematuria - R31.0 Acute, Resolved  4   Anterior urethral stricture - N35.013 Undiagnosed New Problem   PLAN:            Medications Stop Meds: Lisinopril  Discontinue: 03/14/2021  - Reason: The medication cycle was completed.            Orders Labs Urine Culture          Document Letter(s):  Created for Patient: Clinical Summary         Notes:   Plan for cystoscopy with balloon dilation of urethral stricture with optilume. Risk and benefits discussed.   Continue tamsulosin.   CC: Dr. Roxan Hockey    Signed by Link Snuffer, III, M.D. on 03/14/21 at 9:05 AM (EST

## 2021-04-10 NOTE — Telephone Encounter (Signed)
Already spoke to patient 12/6.Tramadol refill phoned into pharmacy.

## 2021-04-10 NOTE — Anesthesia Procedure Notes (Signed)
Procedure Name: LMA Insertion Date/Time: 04/10/2021 7:37 AM Performed by: Gerald Leitz, CRNA Pre-anesthesia Checklist: Patient identified, Patient being monitored, Timeout performed, Emergency Drugs available and Suction available Patient Re-evaluated:Patient Re-evaluated prior to induction Oxygen Delivery Method: Circle system utilized Preoxygenation: Pre-oxygenation with 100% oxygen Induction Type: IV induction Ventilation: Mask ventilation without difficulty LMA: LMA inserted LMA Size: 4.0 Tube type: Oral Number of attempts: 1 Placement Confirmation: positive ETCO2 and breath sounds checked- equal and bilateral Tube secured with: Tape Dental Injury: Teeth and Oropharynx as per pre-operative assessment

## 2021-04-10 NOTE — Anesthesia Postprocedure Evaluation (Signed)
Anesthesia Post Note  Patient: Keith Franklin  Procedure(s) Performed: CYSTOSCOPY WITH OPTILUME URETHRAL DILATATION     Patient location during evaluation: PACU Anesthesia Type: General Level of consciousness: awake Pain management: pain level controlled Vital Signs Assessment: post-procedure vital signs reviewed and stable Respiratory status: spontaneous breathing and respiratory function stable Cardiovascular status: stable Postop Assessment: no apparent nausea or vomiting Anesthetic complications: no   No notable events documented.  Last Vitals:  Vitals:   04/10/21 0830 04/10/21 0845  BP: 110/65 111/67  Pulse: 68 (!) 59  Resp: 13 (!) 9  Temp:    SpO2: 96% 94%    Last Pain:  Vitals:   04/10/21 0830  TempSrc:   PainSc: 0-No pain                 [name]

## 2021-04-10 NOTE — Op Note (Signed)
Operative Note  Preoperative diagnosis:  1.  Pendulous urethral stricture 2.  Gross hematuria  Postoperative diagnosis: 1.  Same  Procedure(s): 1.  Cystoscopy with optilume urethral balloon dilation  Surgeon: Link Snuffer, MD  Assistants: None  Anesthesia: General  Complications: None immediate  EBL: Minimal  Specimens: 1.  None  Drains/Catheters: 1.  18 French Foley catheter  Intraoperative findings: 1.  Approximately 6 French pendulous urethral stricture 1 cm proximal to the urethral meatus.  Remainder the urethra was normal. 2.  Bladder mucosa normal without any masses.  Bilateral ureteral orifices in normal position.  Indication: 60 year old male underwent work-up for gross hematuria found to have a distal urethral stricture presents for the previously mentioned operation.  Description of procedure:  The patient was identified and consent was obtained.  The patient was taken to the operating room and placed in the supine position.  The patient was placed under general anesthesia.  Perioperative antibiotics were administered.  The patient was placed in dorsal lithotomy.  Patient was prepped and draped in a standard sterile fashion and a timeout was performed.  74 French cystoscope was advanced into the urethra and identified the stricture which was about 1 cm in.  A wire was advanced into the urethra and into the bladder.  I sequentially dilated with sounds from 8 [place] to 26 [place].  I was then able to advance the 21 French cystoscope into the urethra.  There was a clear approximately 1 cm area of stricturing that had been dilated in the distal urethra.  The remainder of the urethra was without stricture.  I inspected the bladder mucosa and there were no obvious tumors.  No stones.  I withdrew the scope and again visualize the entire urethra upon removal.  I advanced the optilume 30 French balloon dilator over the wire into the distal urethra and inflated the balloon to a  pressure of 7.  This remained for approximately 10 minutes.  The balloon was deflated and the wire and balloon were withdrawn.  An 79 French urethral catheter was placed.  This concluded the operation.  Patient tolerated procedure well and was stable postoperatively.  Plan: Return in few days for catheter removal.

## 2021-04-10 NOTE — Transfer of Care (Addendum)
Immediate Anesthesia Transfer of Care Note  Patient: Keith Franklin  Procedure(s) Performed: Procedure(s): CYSTOSCOPY WITH OPTILUME URETHRAL DILATATION (N/A)  Patient Location: PACU  Anesthesia Type:General  Level of Consciousness: Alert, Awake, Oriented  Airway & Oxygen Therapy: Patient Spontanous Breathing  Post-op Assessment: Report given to RN  Post vital signs: Reviewed and stable  Last Vitals:  Vitals:   04/10/21 0552  BP: 136/75  Pulse: 78  Temp: 36.4 C  SpO2: 55%    Complications: No apparent anesthesia complications

## 2021-04-11 ENCOUNTER — Encounter (HOSPITAL_COMMUNITY): Payer: Self-pay | Admitting: Urology

## 2021-04-11 NOTE — Progress Notes (Signed)
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  His wife has noted pauses in his breathing while he is asleep.  He also reports daytime tiredness.   Epworth sleepiness score: 10/24.  BMI: 36.4 kg/m  FINDINGS:   Sleep Summary:   Total Recording Time (hours, min): 7 hours, 45 minutes  Total Sleep Time (hours, min):  6 hours, 50 minutes   Percent REM (%):    9.8%   Respiratory Indices:   Calculated pAHI (per hour):  16.4/hour         REM pAHI:    13.8/hour       NREM pAHI: 16.6/hour  Oxygen Saturation Statistics:    Oxygen Saturation (%) Mean: 94%   Minimum oxygen saturation (%):                 86%   O2 Saturation Range (%): 86-99%    O2 Saturation (minutes) <=88%: 0.1 min  Pulse Rate Statistics:   Pulse Mean (bpm):   52/min    Pulse Range (52-80/min)   IMPRESSION: OSA (obstructive sleep apnea)   RECOMMENDATION:  This home sleep test demonstrates moderate obstructive sleep apnea with a total AHI of 16.4/hour and O2 nadir of 86%.  Intermittent mild to moderate snoring was detected. Treatment with positive airway pressure is recommended. The patient will be advised to proceed with an autoPAP titration/trial at home for now. A full night titration study may be considered to optimize treatment settings, if needed down the road. Please note, that untreated obstructive sleep apnea may carry additional perioperative morbidity. Patients with significant obstructive sleep apnea should receive perioperative  PAP therapy and the surgeons and particularly the anesthesiologist should be informed of the diagnosis and the severity of the sleep disordered breathing.  Alternative treatment options may include dental treatment through a dentist with an oral appliance, or surgical options through ENT.  Concomitant weight loss is recommended. The patient should be cautioned not to drive, work at heights, or operate dangerous or heavy equipment when tired or sleepy. Review and reiteration of good sleep hygiene measures should be pursued with any patient. Other causes of the patient's symptoms, including circadian rhythm disturbances, an underlying mood disorder, medication effect and/or an underlying medical problem cannot be ruled out based on this test. Clinical correlation is recommended. The patient and his referring provider will be notified of the test results. The patient will be seen in follow up in sleep clinic at [facility].  I certify that I have reviewed the raw data recording prior to the issuance of this report in accordance with the standards of the American Academy of Sleep Medicine (AASM).  INTERPRETING PHYSICIAN:   Star Age, MD, PhD  Board Certified in Neurology and Sleep Medicine  [facility] [address], [place], [place] [address] [phone]

## 2021-04-14 NOTE — Procedures (Signed)
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  His wife has noted pauses in his breathing while he is asleep.  He also reports daytime tiredness.   Epworth sleepiness score: 10/24.  BMI: 36.4 kg/m  FINDINGS:   Sleep Summary:   Total Recording Time (hours, min): 7 hours, 45 minutes  Total Sleep Time (hours, min):  6 hours, 50 minutes   Percent REM (%):    9.8%   Respiratory Indices:   Calculated pAHI (per hour):  16.4/hour         REM pAHI:    13.8/hour       NREM pAHI: 16.6/hour  Oxygen Saturation Statistics:    Oxygen Saturation (%) Mean: 94%   Minimum oxygen saturation (%):                 86%   O2 Saturation Range (%): 86-99%    O2 Saturation (minutes) <=88%: 0.1 min  Pulse Rate Statistics:   Pulse Mean (bpm):   52/min    Pulse Range (52-80/min)   IMPRESSION: OSA (obstructive sleep apnea)   RECOMMENDATION:  This home sleep test demonstrates moderate obstructive sleep apnea with a total AHI of 16.4/hour and O2 nadir of 86%.  Intermittent mild to moderate snoring was detected. Treatment with positive airway pressure is recommended. The patient will be advised to proceed with an autoPAP titration/trial at home for now. A full night titration study may be considered to optimize treatment settings, if needed down the road. Please note, that untreated obstructive sleep apnea may carry additional perioperative morbidity. Patients with significant obstructive sleep apnea should receive perioperative  PAP therapy and the surgeons and particularly the anesthesiologist should be informed of the diagnosis and the severity of the sleep disordered breathing.  Alternative treatment options may include dental treatment through a dentist with an oral appliance, or surgical options through ENT.  Concomitant weight loss is recommended. The patient should be cautioned not to drive, work at heights, or operate dangerous or heavy equipment when tired or sleepy. Review and reiteration of good sleep hygiene measures should be pursued with any patient. Other causes of the patient's symptoms, including circadian rhythm disturbances, an underlying mood disorder, medication effect and/or an underlying medical problem cannot be ruled out based on this test. Clinical correlation is recommended. The patient and his referring provider will be notified of the test results. The patient will be seen in follow up in sleep clinic at [facility].  I certify that I have reviewed the raw data recording prior to the issuance of this report in accordance with the standards of the American Academy of Sleep Medicine (AASM).  INTERPRETING PHYSICIAN:   Star Age, MD, PhD  Board Certified in Neurology and Sleep Medicine  [facility] [address], [place], [place] [address] [phone]

## 2021-04-14 NOTE — Addendum Note (Signed)
Addended by: Star Age on: 04/14/2021 11:42 AM   Modules accepted: Orders

## 2021-04-16 ENCOUNTER — Encounter: Payer: Self-pay | Admitting: Neurology

## 2021-04-18 ENCOUNTER — Telehealth: Payer: Self-pay

## 2021-04-18 NOTE — Telephone Encounter (Signed)
Denyse Amass, RN; Vanessa Ralphs got it      Previous Messages   ----- Message -----  From: Brandon Melnick, RN  Sent: 04/17/2021   9:34 AM EST  To: Vanessa Ralphs, Marchelle Gearing, *  Subject: new autopap user                               This pt is asking about getting machine prior to end of year due to insurance.  Keith "[name]"  Male, 60 y.o., 26-Jun-1960   Thank you   Keith Newcomer RN

## 2021-04-18 NOTE — Telephone Encounter (Signed)
-----   Message from Star Age, MD sent at 04/14/2021 11:42 AM EST ----- Patient referred by Allwardt, Randa Evens, PA, seen by me on 03/28/2021, patient had a HST on 04/05/2021.    Please call and notify the patient that the recent home sleep test showed obstructive sleep apnea in the moderate range. I recommend treatment in the form of autoPAP, which means, that we don't have to bring him in for a sleep study with CPAP, but will let him start using a so called autoPAP machine at home, which is a CPAP-like machine with self-adjusting pressures. We will send the order to a local DME company (of his choice, or as per insurance requirement). The DME representative will fit him with a mask, educate him on how to use the machine, how to put the mask on, etc. I have placed an order in the chart. Please send the order, talk to patient, send report to referring MD. We will need a FU in sleep clinic for 10 weeks post-PAP set up, please arrange that with me or one of our NPs. Also reinforce the need for compliance with treatment. Thanks,   Star Age, MD, PhD [facility])

## 2021-04-18 NOTE — Telephone Encounter (Signed)
I called Keith Franklin. I advised Keith Franklin that Dr. Rexene Alberts reviewed their sleep study results and found that Keith Franklin has moderate osa. Dr. Rexene Alberts recommends that Keith Franklin start an auto pap at home. I reviewed PAP compliance expectations with the Keith Franklin. Keith Franklin is agreeable to starting an auto-PAP. I advised Keith Franklin that an order will be sent to a DME, [facility], and [facility] will call the Keith Franklin within about one week after they file with the Keith Franklin's insurance. [facility] will show the Keith Franklin how to use the machine, fit for masks, and troubleshoot the auto-PAP if needed. A follow up appt was made for insurance purposes with Dr. Rexene Alberts on 07/06/21 at 8:45am. Keith Franklin verbalized understanding to arrive 15 minutes early and bring their auto-PAP. A letter with all of this information in it will be mailed to the Keith Franklin as a reminder. I verified with the Keith Franklin that the address we have on file is correct. Keith Franklin verbalized understanding of results. Keith Franklin had no questions at this time but was encouraged to call back if questions arise. I have sent the order to [facility] and have received confirmation that they have received the order.  Patient would like this completed ASAP since he has met his OOP deductible this year and expects it to be covered 100%. I have marked this order as urgent and let [facility] manager know this as well.

## 2021-05-12 ENCOUNTER — Other Ambulatory Visit (HOSPITAL_BASED_OUTPATIENT_CLINIC_OR_DEPARTMENT_OTHER): Payer: Self-pay

## 2021-05-12 MED ORDER — CLOPIDOGREL BISULFATE 75 MG PO TABS
ORAL_TABLET | ORAL | 0 refills | Status: DC
  Filled 2021-05-12: qty 90, 90d supply, fill #0

## 2021-05-12 MED ORDER — METOPROLOL SUCCINATE ER 25 MG PO TB24
ORAL_TABLET | ORAL | 0 refills | Status: DC
  Filled 2021-05-12: qty 90, 90d supply, fill #0

## 2021-05-18 ENCOUNTER — Other Ambulatory Visit: Payer: Self-pay | Admitting: Cardiology

## 2021-05-24 ENCOUNTER — Encounter: Payer: Self-pay | Admitting: Cardiology

## 2021-05-24 ENCOUNTER — Other Ambulatory Visit: Payer: Self-pay | Admitting: Cardiology

## 2021-05-24 DIAGNOSIS — G8918 Other acute postprocedural pain: Secondary | ICD-10-CM

## 2021-05-25 MED ORDER — TRAMADOL HCL 50 MG PO TABS: 50.0000 mg | ORAL_TABLET | Freq: Four times a day (QID) | ORAL | 0 refills | Status: DC | PRN

## 2021-05-25 NOTE — Telephone Encounter (Signed)
OK to refill tramadol 50 mg #30 tablets. No refills   PJ  Refill sent to requested pharmacy. Cancelled [facility] to [facility]. Re-sent to [facility] pharmacy.

## 2021-05-26 ENCOUNTER — Other Ambulatory Visit: Payer: Self-pay

## 2021-05-26 ENCOUNTER — Other Ambulatory Visit: Payer: Self-pay | Admitting: Cardiology

## 2021-05-26 ENCOUNTER — Other Ambulatory Visit (HOSPITAL_BASED_OUTPATIENT_CLINIC_OR_DEPARTMENT_OTHER): Payer: Self-pay

## 2021-05-26 DIAGNOSIS — G8918 Other acute postprocedural pain: Secondary | ICD-10-CM

## 2021-05-26 MED ORDER — TRAMADOL HCL 50 MG PO TABS: 50.0000 mg | ORAL_TABLET | Freq: Four times a day (QID) | ORAL | 0 refills | Status: DC | PRN

## 2021-05-26 NOTE — Telephone Encounter (Signed)
Prescription won't allow [name] to send more than 7 day supply

## 2021-05-26 NOTE — Telephone Encounter (Signed)
° °  Pt is calling back, he said the 7 days supply is fine because there will be 28 pills on it and he only take 1 tablet a day. He said to [facility] [place] [facility]

## 2021-05-26 NOTE — Telephone Encounter (Signed)
Spoke to patient Tramadol refill was called [facility] [place].

## 2021-05-28 NOTE — Telephone Encounter (Signed)
Ok  Lizmarie Witters MD, FACC   

## 2021-06-24 ENCOUNTER — Encounter: Payer: Self-pay | Admitting: Cardiology

## 2021-06-26 ENCOUNTER — Other Ambulatory Visit: Payer: Self-pay

## 2021-06-26 DIAGNOSIS — G8918 Other acute postprocedural pain: Secondary | ICD-10-CM

## 2021-06-26 MED ORDER — TRAMADOL HCL 50 MG PO TABS: 50.0000 mg | ORAL_TABLET | Freq: Four times a day (QID) | ORAL | 5 refills | Status: DC | PRN

## 2021-06-26 NOTE — Telephone Encounter (Signed)
Probably shouldn't need tramadol now over 6 months out from surgery. I would try tylenol instead  Geneen Dieter [place] MD, [facility]

## 2021-06-26 NOTE — Telephone Encounter (Signed)
OK - go ahead and fill tramadol 50 mg #30 with 5 refills  Kameron Blethen [place] MD, [facility]

## 2021-07-04 ENCOUNTER — Encounter: Payer: Self-pay | Admitting: Neurology

## 2021-07-06 ENCOUNTER — Ambulatory Visit: Payer: PRIVATE HEALTH INSURANCE | Admitting: Neurology

## 2021-07-06 ENCOUNTER — Encounter: Payer: Self-pay | Admitting: Neurology

## 2021-07-06 VITALS — BP 125/73 | HR 63 | Ht 68.0 in | Wt 248.4 lb

## 2021-07-06 DIAGNOSIS — G4700 Insomnia, unspecified: Secondary | ICD-10-CM | POA: Diagnosis not present

## 2021-07-06 DIAGNOSIS — G4733 Obstructive sleep apnea (adult) (pediatric): Secondary | ICD-10-CM | POA: Diagnosis not present

## 2021-07-06 DIAGNOSIS — Z9989 Dependence on other enabling machines and devices: Secondary | ICD-10-CM | POA: Diagnosis not present

## 2021-07-06 NOTE — Progress Notes (Signed)
Subjective:    Keith Franklin ID: Keith Franklin is a 61 y.o. male.  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  The Keith Franklin is unaccompanied today.  I first met him at the request of his primary care provider on 03/28/2021, at which time Keith Franklin reported snoring and witnessed apneas as well as tiredness and difficulty maintaining sleep.  Keith Franklin was advised to proceed with a sleep study.  Keith Franklin had a home sleep test on 04/05/2021 which indicated moderate obstructive sleep apnea with a total AHI of 16.4/hour and O2 nadir of 86%.  Intermittent mild to moderate snoring was detected.  Keith Franklin was advised to proceed with AutoPap therapy.  His set up date was 04/19/21.  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  Today, 07/06/2021: I reviewed his AutoPap compliance data from 06/05/2021 through 07/04/2021, which is a total of 30 days, during which time Keith Franklin used his machine 29 days with percent use days greater than 4 hours at 96.7%, indicating excellent compliance with an average usage of 7 hours and 59 minutes, residual AHI at goal at 0.4/h, average leak at 6.2 L/min which is acceptable, average pressure for the 95th percentile at 6.6 cm, range of 5 to 11 cm.  Keith Franklin reports generally doing well with his AutoPap.  Keith Franklin feels that it is helpful but Keith Franklin still has sleep disruption and wakes up in the middle of the night 2 to 3 times.  Of note, Keith Franklin has had some general muscular discomfort, attributes this to taking a statin.  Keith Franklin was able to reduce his statin due to low LDL.  Keith Franklin is currently at goal with his cholesterol numbers.  Keith Franklin follows with cardiology on a regular basis, still has discomfort in his sternum and takes tramadol  typically once daily in the evening around 9 PM.  According to his sleep app Keith Franklin has lower respiratory rate in the early morning hours, as low as 10/min.  Keith Franklin is not particularly worried about it but noticed it.  Keith Franklin denies any shortness of breath, palpitations, chest pain or difficulty breathing.  Keith Franklin is very motivated to continue with his AutoPap.  Keith Franklin is exercising on a regular basis.  Prior to his heart attack and open heart surgery Keith Franklin was able to take Advil PM which helped him sleep through the night.  After his heart attack and since Keith Franklin is on Plavix, Keith Franklin has tried Tylenol PM which does not help.  Melatonin did not help as much either.  The Keith Franklin's allergies, current medications, family history, past medical history, past social history, past surgical history and problem list were reviewed and updated as appropriate.   Previously:   03/28/21: (Keith Franklin) reports snoring and difficulty maintaining sleep.  His wife has noted pauses in his breathing while Keith Franklin is asleep.  Keith Franklin also reports daytime tiredness.  I reviewed your office note from 01/13/2021.  Keith Franklin has been on melatonin for sleep and has also tried p.m. type medications.  Keith Franklin was noted to have oxygen desaturations while asleep when Keith Franklin was hospitalized for his heart attack in June 2022 and was suspected to have obstructive sleep apnea.  Keith Franklin reports that right after his open heart surgery Keith Franklin was briefly placed on BiPAP therapy  to regulate his breathing.  His Epworth sleepiness score is 10 out of 24, fatigue severity score is 38 out of 63.  Keith Franklin is married and has 2 grown daughters, older daughter lives in [place] and is finishing her nursing degree, younger daughter lives with, just finished college.  Keith Franklin is working on weight loss.  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  Keith Franklin has a bedtime of around 10 PM but wakes up often in the early morning hours  around 3 AM.  Keith Franklin has had trouble falling asleep after that.  Keith Franklin has had good success with Advil PM but cannot take it any longer since his heart attack.  Keith Franklin tried Tylenol PM which did not work as well.  Melatonin did not help either.  Keith Franklin suspects that his father may have had sleep apnea but was never tested, Keith Franklin was a loud snorer.  Keith Franklin is a non-smoker and drinks alcohol rarely, caffeine in limitation in the form of coffee, 1 or 2 cups in the mornings only.  Keith Franklin has nocturia about once or twice per average night, typically no recurrent morning headaches.  Keith Franklin finished cardiac rehab and is going to start going to the gym.  Keith Franklin is scheduled to have a cystoscopy with balloon dilatation done next month.  An office-based procedure was not possible as Keith Franklin had scar tissue.  Keith Franklin does have a history of passing a kidney stone in the past.    His Past Medical History Is Significant For: Past Medical History:  Diagnosis Date   Cancer ([place]) 2010   skin cancer, Basal Cell Carcinoma    COVID-19 07/2019   GERD (gastroesophageal reflux disease)    History of kidney stones    Myocardial infarction ([place]) 09/30/2020    His Past Surgical History Is Significant For: Past Surgical History:  Procedure Laterality Date   CORONARY ARTERY BYPASS GRAFT N/A 10/03/2020   Procedure: CORONARY ARTERY BYPASS GRAFTING (CABG) X THREE, ON PUMP, USING LEFT INTERNAL MAMMARY ARTERY AND RIGHT GREATER SAPHENOUS ENDOSCOPIC VEIN HARVEST CONDUITS;  Surgeon: Melrose Nakayama, MD;  Location: [facility];  Service: Open Heart Surgery;  Laterality: N/A;  will harvest left radial artery for conduit Swan   CORONARY/GRAFT ACUTE MI REVASCULARIZATION N/A 09/30/2020   Procedure: Coronary/Graft Acute MI Revascularization;  Surgeon: Martinique, Peter M, MD;  Location: [facility];  Service: Cardiovascular;  Laterality: N/A;   CYSTOSCOPY WITH URETHRAL DILATATION N/A 04/10/2021   Procedure: CYSTOSCOPY WITH OPTILUME URETHRAL DILATATION;  Surgeon: Lucas Mallow, MD;  Location: [facility];  Service: Urology;  Laterality: N/A;   LEFT HEART CATH AND CORONARY ANGIOGRAPHY N/A 09/30/2020   Procedure: LEFT HEART CATH AND CORONARY ANGIOGRAPHY;  Surgeon: Martinique, Peter M, MD;  Location: [facility];  Service: Cardiovascular;  Laterality: N/A;   skin cancer removal     TONSILLECTOMY     WISDOM TOOTH EXTRACTION      His Family History Is Significant For: Family History  Problem Relation Age of Onset   Cancer Mother    Heart attack Father    Sleep apnea Neg Hx     His Social History Is Significant For: Social History   Socioeconomic History   Marital status: Married    Spouse name: Not on file   Number of children: Not on file   Years of education: Not on file   Highest  education level: Not on file  Occupational History   Not on file  Tobacco Use   Smoking status: Never   Smokeless tobacco: Never  Vaping Use   Vaping Use: Never used  Substance and Sexual Activity   Alcohol use: Not Currently    Comment: rare   Drug use: Never   Sexual activity: Not on file  Other Topics Concern   Not on file  Social History Narrative   Not on file   Social Determinants of Health   Financial Resource Strain: Not on file  Food Insecurity: Not on file  Transportation Needs: Not on file  Physical Activity: Not on file  Stress: Not on file  Social Connections: Not on file    His Allergies Are:  No Known Allergies:   His Current Medications Are:  Outpatient Encounter Medications as of 07/06/2021  Medication Sig   acetaminophen (TYLENOL) 500 MG tablet Take 1,000 mg by mouth every 6 (six) hours as needed for moderate pain.   aspirin EC 81 MG EC tablet Take 1 tablet (81 mg total) by mouth daily. Swallow whole.   atorvastatin (LIPITOR) 40 MG tablet Take 1 tablet (40 mg total) by mouth daily.   clopidogrel (PLAVIX) 75 MG tablet TAKE 1 TABLET BY MOUTH EVERY DAY   metoprolol succinate (TOPROL XL) 25 MG 24 hr tablet Take 1 tablet (25 mg  total) by mouth daily.   tamsulosin (FLOMAX) 0.4 MG CAPS capsule Take 0.4 mg by mouth daily.   traMADol (ULTRAM) 50 MG tablet Take 1 tablet (50 mg total) by mouth every 6 (six) hours as needed.   famotidine (PEPCID) 10 MG tablet Take 10 mg by mouth at bedtime as needed for heartburn or indigestion.   No facility-administered encounter medications on file as of 07/06/2021.  :  Review of Systems:  Out of a complete 14 point review of systems, all are reviewed and negative with the exception of these symptoms as listed below:  Review of Systems  Neurological:        Pt is here for CPAP follow up  Pt states Keith Franklin still wakes up for 2-3 hours during the night . Pt wants to discuss his respiratory rate . Pt states Keith Franklin drops to 7 -8 breaths per minute during the night    Objective:  Neurological Exam  Physical Exam Physical Examination:   Vitals:   07/06/21 0846  BP: 125/73  Pulse: 63    General Examination: The Keith Franklin is a very pleasant 61 y.o. male in no acute distress. Keith Franklin appears well-developed and well-nourished and well groomed.   HEENT: Normocephalic, atraumatic, pupils are equal, round and reactive to light, extraocular tracking well-preserved, corrective eyeglasses in place.  Hearing grossly intact, face is symmetric with normal facial animation, speech is clear without dysarthria, hypophonia or voice tremor.  Airway examination reveals mild mouth dryness, tongue protrudes centrally and palate elevates symmetrically, stable findings, overall moderate airway crowding.  No carotid bruits.  Neck with full range of motion.     Chest: Clear to auscultation without wheezing, rhonchi or crackles noted.   Heart: S1+S2+0, regular and normal without murmurs, rubs or gallops noted.  Mild bradycardia noted.   Abdomen: Soft, non-tender and non-distended.   Extremities: There is no pitting edema in the distal lower extremities bilaterally.    Skin: Warm and dry without trophic changes noted.     Musculoskeletal: exam reveals no obvious joint deformities.    Neurologically:  Mental status: The Keith Franklin is awake, alert  and oriented in all 4 spheres. His immediate and remote memory, attention, language skills and fund of knowledge are appropriate. There is no evidence of aphasia, agnosia, apraxia or anomia. Speech is clear with normal prosody and enunciation. Thought process is linear. Mood is normal and affect is normal.  Cranial nerves II - XII are as described above under HEENT exam.  Motor exam: Normal bulk, strength and tone is noted. There is no tremor, fine motor skills and coordination: grossly intact.  Cerebellar testing: No dysmetria or intention tremor. There is no truncal or gait ataxia.  Sensory exam: intact to light touch in the upper and lower extremities.  Gait, station and balance: Keith Franklin stands easily. No veering to one side is noted. No leaning to one side is noted. Posture is age-appropriate and stance is narrow based. Gait shows normal stride length and normal pace. No problems turning are noted.    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  Keith Franklin has established treatment with an AutoPap machine since 04/19/2021 and is compliant with treatment.  Keith Franklin has benefited from it.  We talked about his sleep test results and reviewed his compliance data in detail.  Numbers look good, his apnea scores are at goal and his leak from the mask is acceptable on the lower range.  Keith Franklin is commended for his treatment adherence.   Keith Franklin is working on weight loss and exercises regularly.  Keith Franklin is followed by cardiology regularly.  Keith Franklin has noticed a lower respiratory rate at times in the early morning hours.  While unlikely from the tramadol Keith Franklin is advised to try to move up the tramadol dose to maybe 6 PM or even in the afternoon and noon area but it may affect his driving skills as it is sometimes sedating to patients and could affect his vigilance while driving for example.  Keith Franklin is cautioned about the and is advised to monitor for sedation by moving up the tramadol time gradually.  Keith Franklin may be able to take Tylenol at night.  For his sleep disturbance Keith Franklin is encouraged to try melatonin again, Keith Franklin could go gradually up to 10 mg about an hour before his projects at bedtime.  Keith Franklin is willing to try this.  Keith Franklin is advised to continue with his AutoPap at the current settings and encouraged to follow-up routinely in sleep clinic to see one of our nurse practitioners in 1 year.  I answered all his questions today and Keith Franklin was in agreement with our plan.   I spent 40 minutes in total face-to-face time and in reviewing records during pre-charting, more than 50% of which was spent in counseling and coordination of care, reviewing test results, reviewing medications and treatment regimen and/or in discussing or reviewing the diagnosis of OSA, sleep disturbance, the prognosis and treatment options. Pertinent laboratory and imaging test results that were available during this visit with the Keith Franklin were reviewed by me and considered in my medical decision making (see chart for details).

## 2021-07-06 NOTE — Patient Instructions (Addendum)
It was nice to see you again today.  I am glad to hear that you have adjusted to your AutoPap, you are fully compliant and your numbers look great. ? ? ?You may want to try Melatonin again, you can start with 2 to 3 mg, may gradually increase to 5 mg and may go up to 10 mg about an hour before bedtime.   ? ?As far as your respiratory rate going down at night, it may be in part related to your tramadol.  It may be worthwhile trying to move up the tramadol dose time to 6 PM and gradually further up to 3 PM and noon.  It is not very long-acting but it may be worth trying to shift the timing for the tramadol. ? ?Please continue using your autoPAP regularly. While your insurance requires that you use PAP at least 4 hours each night on 70% of the nights, I recommend, that you not skip any nights and use it throughout the night if you can. Getting used to PAP and staying with the treatment long term does take time and patience and discipline. Untreated obstructive sleep apnea when it is moderate to severe can have an adverse impact on cardiovascular health and raise her risk for heart disease, arrhythmias, hypertension, congestive heart failure, stroke and diabetes. Untreated obstructive sleep apnea causes sleep disruption, nonrestorative sleep, and sleep deprivation. This can have an impact on your day to day functioning and cause daytime sleepiness and impairment of cognitive function, memory loss, mood disturbance, and problems focussing. Using PAP regularly can improve these symptoms. ? ?Keep up the good work, you can follow-up in 1 year to see one of our nurse practitioners in a face-to-face or virtual visit. ? ? ?

## 2021-07-18 ENCOUNTER — Encounter: Payer: Self-pay | Admitting: Physician Assistant

## 2021-07-18 ENCOUNTER — Ambulatory Visit (INDEPENDENT_AMBULATORY_CARE_PROVIDER_SITE_OTHER): Payer: PRIVATE HEALTH INSURANCE | Admitting: Physician Assistant

## 2021-07-18 VITALS — BP 110/62 | HR 63 | Temp 98.0°F | Ht 68.0 in | Wt 247.2 lb

## 2021-07-18 DIAGNOSIS — G4733 Obstructive sleep apnea (adult) (pediatric): Secondary | ICD-10-CM | POA: Diagnosis not present

## 2021-07-18 DIAGNOSIS — Z85828 Personal history of other malignant neoplasm of skin: Secondary | ICD-10-CM

## 2021-07-18 DIAGNOSIS — R7303 Prediabetes: Secondary | ICD-10-CM

## 2021-07-18 DIAGNOSIS — Z951 Presence of aortocoronary bypass graft: Secondary | ICD-10-CM | POA: Diagnosis not present

## 2021-07-18 DIAGNOSIS — Z1211 Encounter for screening for malignant neoplasm of colon: Secondary | ICD-10-CM | POA: Diagnosis not present

## 2021-07-18 DIAGNOSIS — Z9889 Other specified postprocedural states: Secondary | ICD-10-CM

## 2021-07-18 DIAGNOSIS — L910 Hypertrophic scar: Secondary | ICD-10-CM

## 2021-07-18 DIAGNOSIS — Z9989 Dependence on other enabling machines and devices: Secondary | ICD-10-CM

## 2021-07-18 LAB — POCT GLYCOSYLATED HEMOGLOBIN (HGB A1C): Hemoglobin A1C: 5.4 % (ref 4.0–5.6)

## 2021-07-18 NOTE — Progress Notes (Signed)
? ?Subjective:  ? ? Patient ID: Keith Franklin, male    DOB: 04/16/1961, 61 y.o.   MRN: [id number] ? ?Chief Complaint  ?Patient presents with  ? Follow-up  ? ? ?HPI ?Patient is in today for f/up from 6 months ago when he initially establish care with me.  History of myocardial infarction 09/30/20 with CABG procedure on 10/03/2020. ? ?Mild OSA - uses CPAP every night. ?Melatonin 5 mg nightly is helping to sleep through the night. ? ?Went for a cystoscopy with optilume urethral dilatation ? ?Cardio and strength training 5-6 days per week at [employer]. ?Gaining weight, but says his calorie intake hasn't changed.  ?Considering nutritionist for additional help.  ? ?Past Medical History:  ?Diagnosis Date  ? Cancer [facility]) 2010  ? skin cancer, Basal Cell Carcinoma   ? COVID-19 07/2019  ? GERD (gastroesophageal reflux disease)   ? History of kidney stones   ? Myocardial infarction ([place]) 09/30/2020  ? ? ?Past Surgical History:  ?Procedure Laterality Date  ? CORONARY ARTERY BYPASS GRAFT N/A 10/03/2020  ? Procedure: CORONARY ARTERY BYPASS GRAFTING (CABG) X THREE, ON PUMP, USING LEFT INTERNAL MAMMARY ARTERY AND RIGHT GREATER SAPHENOUS ENDOSCOPIC VEIN HARVEST CONDUITS;  Surgeon: Melrose Nakayama, MD;  Location: [facility];  Service: Open Heart Surgery;  Laterality: N/A;  will harvest left radial artery for conduit ?Swan  ? CORONARY/GRAFT ACUTE MI REVASCULARIZATION N/A 09/30/2020  ? Procedure: Coronary/Graft Acute MI Revascularization;  Surgeon: Martinique, Peter M, MD;  Location: [facility];  Service: Cardiovascular;  Laterality: N/A;  ? CYSTOSCOPY WITH URETHRAL DILATATION N/A 04/10/2021  ? Procedure: CYSTOSCOPY WITH OPTILUME URETHRAL DILATATION;  Surgeon: Lucas Mallow, MD;  [facility];  Service: Urology;  Laterality: N/A;  ? LEFT HEART CATH AND CORONARY ANGIOGRAPHY N/A 09/30/2020  ? Procedure: LEFT HEART CATH AND CORONARY ANGIOGRAPHY;  Surgeon: Martinique, Peter M, MD;  Location: [facility];  Service:  Cardiovascular;  Laterality: N/A;  ? skin cancer removal    ? TONSILLECTOMY    ? WISDOM TOOTH EXTRACTION    ? ? ?Family History  ?Problem Relation Age of Onset  ? Cancer Mother   ? Heart attack Father   ? Sleep apnea Neg Hx   ? Colon cancer Neg Hx   ? ? ?Social History  ? ?Tobacco Use  ? Smoking status: Never  ? Smokeless tobacco: Never  ?Vaping Use  ? Vaping Use: Never used  ?Substance Use Topics  ? Alcohol use: Not Currently  ?  Comment: rare  ? Drug use: Never  ?  ? ?No Known Allergies ? ?Review of Systems ?NEGATIVE UNLESS OTHERWISE INDICATED IN HPI ? ? ?   ?Objective:  ?  ? ?BP 110/62   Pulse 63   Temp 98 ?F (36.7 ?C)   Ht '5\' 8"'$  (1.727 m)   Wt 247 lb 3.2 oz (112.1 kg)   SpO2 97%   BMI 37.59 kg/m?  ? ?Wt Readings from Last 3 Encounters:  ?07/18/21 247 lb 3.2 oz (112.1 kg)  ?07/06/21 248 lb 6.4 oz (112.7 kg)  ?04/10/21 232 lb 9.6 oz (105.5 kg)  ? ? ?BP Readings from Last 3 Encounters:  ?07/18/21 110/62  ?07/06/21 125/73  ?04/10/21 111/67  ?  ? ?Physical Exam ?Vitals and nursing note reviewed.  ?Constitutional:   ?   General: He is not in acute distress. ?   Appearance: Normal appearance. He is not toxic-appearing.  ?HENT:  ?   Head: Normocephalic and atraumatic.  ?  Right Ear: External ear normal.  ?   Left Ear: External ear normal.  ?   Nose: Nose normal.  ?   Mouth/Throat:  ?   Mouth: Mucous membranes are moist.  ?   Pharynx: Oropharynx is clear.  ?Eyes:  ?   Extraocular Movements: Extraocular movements intact.  ?   Conjunctiva/sclera: Conjunctivae normal.  ?   Pupils: Pupils are equal, round, and reactive to light.  ?Cardiovascular:  ?   Rate and Rhythm: Normal rate and regular rhythm.  ?   Pulses: Normal pulses.  ?   Heart sounds: Normal heart sounds.  ?Pulmonary:  ?   Effort: Pulmonary effort is normal.  ?   Breath sounds: Normal breath sounds.  ?Chest:  ?   Comments: Vertical scar chest wall s/p CABG with keloid ?Abdominal:  ?   General: Abdomen is flat. Bowel sounds are normal.  ?   Palpations:  Abdomen is soft.  ?   Tenderness: There is no abdominal tenderness.  ?Musculoskeletal:     ?   General: Normal range of motion.  ?   Cervical back: Normal range of motion and neck supple.  ?Skin: ?   General: Skin is warm and dry.  ?   Comments: Mid-back approx 3 cm seborrheic keratosis   ?Neurological:  ?   General: No focal deficit present.  ?   Mental Status: He is alert and oriented to person, place, and time.  ?Psychiatric:     ?   Mood and Affect: Mood normal.     ?   Behavior: Behavior normal.  ? ? ?   ?Assessment & Plan:  ? ?Problem List Items Addressed This Visit   ? ?  ? Other  ? S/P CABG x 3 - Primary  ? ?Other Visit Diagnoses   ? ? OSA on CPAP      ? Prediabetes      ? Relevant Orders  ? POCT HgB A1C (Completed)  ? Screening for colon cancer      ? Relevant Orders  ? Cologuard  ? History of basal cell carcinoma      ? Relevant Orders  ? Ambulatory referral to Dermatology  ? Postoperative keloid scar      ? Relevant Orders  ? Ambulatory referral to Dermatology  ? ?  ? ? ?1. S/P CABG x 3 ?-Patient continues regular follow-up with cardiology. ?-Continues to stay on Plavix 75 mg, Lipitor 40 mg, aspirin 81 mg, Toprol-XL 25 mg. ?-He is doing really well with staying on top of nutrition and exercise. ? ?2. OSA on CPAP ?Patient underwent home sleep test on 04/05/2021 and was diagnosed with moderate obstructive sleep apnea.  Started on CPAP and thinks this is helping. ? ?3. Prediabetes ?Lab Results  ?Component Value Date  ? HGBA1C 5.4 07/18/2021  ? ?Encouraged patient his hemoglobin A1c level is doing great.  He will continue the good work with healthy habits. ? ?4. Screening for colon cancer ?Low risk, plan for Cologuard. ? ?5. History of basal cell carcinoma ?6. Postoperative keloid scar ?Referral placed for dermatology today as he does have a history of basal cell carcinoma.  He has a keloid from his CABG procedure and has some pain with this.  He could benefit from intralesional Kenalog injections and I  discussed with him that dermatology should be able to perform this for him to help. ? ?Plan for regular follow-up in the next 6 months or as needed. ? ? ?This note was  prepared with assistance of Systems analyst. Occasional wrong-word or sound-a-like substitutions may have occurred due to the inherent limitations of voice recognition software. ? ?Benicia Bergevin M Johnika Escareno, PA-C ?

## 2021-07-24 ENCOUNTER — Other Ambulatory Visit: Payer: Self-pay | Admitting: General Practice

## 2021-07-25 ENCOUNTER — Ambulatory Visit: Payer: 59 | Admitting: Cardiology

## 2021-08-08 ENCOUNTER — Telehealth: Payer: Self-pay | Admitting: Physician Assistant

## 2021-08-08 ENCOUNTER — Encounter: Payer: Self-pay | Admitting: Physician Assistant

## 2021-08-08 LAB — COLOGUARD: COLOGUARD: NEGATIVE

## 2021-08-08 NOTE — Telephone Encounter (Signed)
Patient called wanting to be seen today by alyssa. Patient was advised Alyssa did not have an avl apt today,however he booked 4/13 at 830am - Stated he has had a bruise for 1 week but today hes got a knot in the middle of bruise. Stated he is on blood thinners and if this is something he should be worried about. Patient will be sending images  via mychart.  ?Ok to wait till 4/13?  ?

## 2021-08-10 ENCOUNTER — Ambulatory Visit: Payer: PRIVATE HEALTH INSURANCE | Admitting: Physician Assistant

## 2021-08-10 ENCOUNTER — Encounter: Payer: Self-pay | Admitting: Cardiology

## 2021-08-10 NOTE — Telephone Encounter (Signed)
He is having PVCs. Prior lab in Dec looked good. EF 40-45%. I would recommend increasing Toprol XL to 50 mg daily. Avoid caffeine.  ? ?Keith Franklin Martinique MD, [facility] ? ?

## 2021-08-15 NOTE — Telephone Encounter (Signed)
Spoke to patient advised to increase Toprol to 50 mg daily and avoid caffeine.Stated he would like appointment to see Dr.Jordan this week.Appointment scheduled with Dr.Jordan 08/18/21 at 1:20 pm. ?

## 2021-08-16 NOTE — Progress Notes (Signed)
f  ?  ?Cardiology Office Note ? ? ?Date:  08/18/2021  ? ?ID:  Keith Franklin, DOB Mar 02, 1961, MRN [id number] ? ?PCP:  Allwardt, Randa Evens, PA-C  ?Cardiologist:   Aralynn Brake Martinique, MD  ? ?Chief Complaint  ?Patient presents with  ? Coronary Artery Disease  ? ? ?  ?History of Present Illness: ?Keith Franklin is a 61 y.o. male who presents for follow up CAD. He presented to [facility] in June 2022 with chest pain and was ruled in for ST elevated myocardial infarction.  He was taken emergently to the cardiac Cath Lab which showed a totally occluded circumflex.  We were able to reestablish flow in  the circumflex with POBA.  However, he was also noted to have severe LAD diagonal disease with moderate residual circumflex disease.  CABG was recommended.   He underwent CABG x3 on 10/03/2020.  While his lines were being placed and he was given sedation he had a bradycardic event with pulseless electrical activity cardiac arrest.  CPR was initiated.  It was performed for less than 2 minutes.  He had return of spontaneous circulation and was taken emergently to the operating room.    He received a LIMA-LAD, SVG to first diagonal and third obtuse marginal.   He was noted to have occasional monitor desaturations at night.  He was felt to potentially have OSA.  He continued to recover well.  He was discharged home in stable condition with his wife who is a retired Therapist, sports his daughter who is a Psychologist, sport and exercise on 10/07/2020. ?  ?He was seen in follow up in June and September.  Repeat Echo in October 2022 showed EF 40-45%. Progressing well. Noted insomnia. Sleep study planned. He was  seen by urology for hematuria and underwent cystoscopy and urethral dilation in December.  ? ?He has home sleep study showed moderate OSA. Recommended auto PAP titration at home. Now followed by Dr Rexene Alberts with good compliance noted on follow up in March.  ? ?He did report some recent palpitations due to PVCs. Reviewed tracings on Apple Watch. We  recommended increasing his Toprol XL.  ? ?He is working out at [employer] 6 days a week doing 45 minutes of aerobics and 40 minutes strength training. Denies any chest pain or SOB. Still has some insomnia. Notes easy bruising. Last A1c down to 5.4%. Really eating healthy.  ? ?Past Medical History:  ?Diagnosis Date  ? Cancer [facility]) 2010  ? skin cancer, Basal Cell Carcinoma   ? COVID-19 07/2019  ? GERD (gastroesophageal reflux disease)   ? History of kidney stones   ? Myocardial infarction ([place]) 09/30/2020  ? ? ?Past Surgical History:  ?Procedure Laterality Date  ? CORONARY ARTERY BYPASS GRAFT N/A 10/03/2020  ? Procedure: CORONARY ARTERY BYPASS GRAFTING (CABG) X THREE, ON PUMP, USING LEFT INTERNAL MAMMARY ARTERY AND RIGHT GREATER SAPHENOUS ENDOSCOPIC VEIN HARVEST CONDUITS;  Surgeon: Melrose Nakayama, MD;  Location: [place];  Service: Open Heart Surgery;  Laterality: N/A;  will harvest left radial artery for conduit ?[place]  ? CORONARY/GRAFT ACUTE MI REVASCULARIZATION N/A 09/30/2020  ? Procedure: Coronary/Graft Acute MI Revascularization;  Surgeon: Martinique, Amaree Leeper M, MD;  Location: [place] [facility];  Service: Cardiovascular;  Laterality: N/A;  ? CYSTOSCOPY WITH URETHRAL DILATATION N/A 04/10/2021  ? Procedure: CYSTOSCOPY WITH OPTILUME URETHRAL DILATATION;  Surgeon: Lucas Mallow, MD;  Location: [facility];  Service: Urology;  Laterality: N/A;  ? LEFT HEART CATH AND CORONARY ANGIOGRAPHY N/A 09/30/2020  ?  Procedure: LEFT HEART CATH AND CORONARY ANGIOGRAPHY;  Surgeon: Martinique, Niyam Bisping M, MD;  Location: [place] [facility];  Service: Cardiovascular;  Laterality: N/A;  ? skin cancer removal    ? TONSILLECTOMY    ? WISDOM TOOTH EXTRACTION    ? ? ? ?Current Outpatient Medications  ?Medication Sig Dispense Refill  ? acetaminophen (TYLENOL) 500 MG tablet Take 1,000 mg by mouth every 6 (six) hours as needed for moderate pain.    ? aspirin EC 81 MG EC tablet Take 1 tablet (81 mg total) by mouth daily. Swallow whole.    ? atorvastatin  (LIPITOR) 40 MG tablet TAKE 1 TABLET BY MOUTH EVERY DAY 90 tablet 2  ? clopidogrel (PLAVIX) 75 MG tablet TAKE 1 TABLET BY MOUTH EVERY DAY 90 tablet 1  ? metoprolol succinate (TOPROL-XL) 50 MG 24 hr tablet Take 1 tablet (50 mg total) by mouth daily. Take with or immediately following a meal.    ? tamsulosin (FLOMAX) 0.4 MG CAPS capsule Take 0.4 mg by mouth daily.    ? traMADol (ULTRAM) 50 MG tablet Take 1 tablet (50 mg total) by mouth every 6 (six) hours as needed. (Patient not taking: Reported on 08/18/2021) 30 tablet 5  ? ?No current facility-administered medications for this visit.  ? ? ?Allergies:   Patient has no known allergies.  ? ? ?Social History:  The patient  reports that he has never smoked. He has never used smokeless tobacco. He reports that he does not currently use alcohol. He reports that he does not use drugs.  ? ?Family History:  The patient's family history includes Cancer in his mother; Heart attack in his father.  ? ? ?ROS:  Please see the history of present illness.   Otherwise, review of systems are positive for none.   All other systems are reviewed and negative.  ? ? ?PHYSICAL EXAM: ?VS:  BP 118/74 (BP Location: Left Arm, Patient Position: Sitting, Cuff Size: Large)   Pulse 64   Ht '5\' 8"'$  (1.727 m)   Wt 240 lb 3.2 oz (109 kg)   SpO2 97%   BMI 36.52 kg/m?  , BMI Body mass index is 36.52 kg/m?. ?GEN: Well nourished, well developed, in no acute distress ?HEENT: normal ?Neck: no JVD, carotid bruits, or masses ?Cardiac: RRR; no murmurs, rubs, or gallops,no edema  ?Respiratory:  clear to auscultation bilaterally, normal work of breathing ?Chest with median sternotomy scar with diffuse keloid ?GI: soft, nontender, nondistended, + BS ?MS: no deformity or atrophy ?Skin: warm and dry, no rash ?Neuro:  Strength and sensation are intact ?Psych: euthymic mood, full affect ? ? ?EKG:  EKG is not ordered today. ?The ekg ordered today demonstrates N/A ? ? ?Recent Labs: ?10/03/2020: ALT 36 ?10/04/2020:  Magnesium 2.6 ?04/05/2021: BUN 19; Creatinine, Ser 0.93; Hemoglobin 14.8; Platelets 268; Potassium 4.1; Sodium 137  ? ? ?Lipid Panel ?   ?Component Value Date/Time  ? CHOL 96 03/29/2021 0812  ? TRIG 43.0 03/29/2021 0812  ? HDL 37.70 (L) 03/29/2021 0867  ? CHOLHDL 3 03/29/2021 6195  ? VLDL 8.6 03/29/2021 0812  ? [place] 50 03/29/2021 0812  ? LDLCALC 32 01/13/2021 1516  ? ?  ? ?Wt Readings from Last 3 Encounters:  ?08/18/21 240 lb 3.2 oz (109 kg)  ?07/18/21 247 lb 3.2 oz (112.1 kg)  ?07/06/21 248 lb 6.4 oz (112.7 kg)  ?  ? ? ?Other studies Reviewed: ?Additional studies/ records that were reviewed today include:  ? ?Echocardiogram 10/01/2020 ?IMPRESSIONS  ? ? ?  1. Left ventricular ejection fraction, by estimation, is 45 to 50%. The  ?left ventricle has mildly decreased function. The left ventricle has no  ?regional wall motion abnormalities. There is mild concentric left  ?ventricular hypertrophy. Left ventricular  ?diastolic parameters are consistent with Grade I diastolic dysfunction  ?(impaired relaxation). There is moderate hypokinesis of the left  ?ventricular, mid-apical anterior wall. There is moderate hypokinesis of  ?the left ventricular, entire inferolateral  ?wall.  ? 2. Right ventricular systolic function is normal. The right ventricular  ?size is normal.  ? 3. Left atrial size was mildly dilated.  ? 4. The mitral valve is normal in structure. No evidence of mitral valve  ?regurgitation. No evidence of mitral stenosis.  ? 5. The aortic valve is tricuspid. Aortic valve regurgitation is not  ?visualized. No aortic stenosis is present.  ? 6. There is mild dilatation of the ascending aorta, measuring 42 mm.  ? 7. The inferior vena cava is normal in size with greater than 50%  ?respiratory variability, suggesting right atrial pressure of 3 mmHg.  ?  ?  ?Cardiac catheterization 10/27/2020 ?Prox LAD to Mid LAD lesion is 90% stenosed. ?Mid LAD lesion is 90% stenosed. ?1st Diag lesion is 40% stenosed. ?Mid Cx to Dist Cx  lesion is 100% stenosed. ?RV Branch lesion is 90% stenosed. ?2nd RPL lesion is 70% stenosed. ?The left ventricular systolic function is normal. ?LV end diastolic pressure is normal. ?The left ventricular ejection fraction

## 2021-08-18 ENCOUNTER — Ambulatory Visit (INDEPENDENT_AMBULATORY_CARE_PROVIDER_SITE_OTHER): Payer: PRIVATE HEALTH INSURANCE | Admitting: Cardiology

## 2021-08-18 ENCOUNTER — Encounter: Payer: Self-pay | Admitting: Cardiology

## 2021-08-18 VITALS — BP 118/74 | HR 64 | Ht 68.0 in | Wt 240.2 lb

## 2021-08-18 DIAGNOSIS — Z9989 Dependence on other enabling machines and devices: Secondary | ICD-10-CM

## 2021-08-18 DIAGNOSIS — Z951 Presence of aortocoronary bypass graft: Secondary | ICD-10-CM | POA: Diagnosis not present

## 2021-08-18 DIAGNOSIS — E782 Mixed hyperlipidemia: Secondary | ICD-10-CM | POA: Diagnosis not present

## 2021-08-18 DIAGNOSIS — G4733 Obstructive sleep apnea (adult) (pediatric): Secondary | ICD-10-CM

## 2021-08-18 DIAGNOSIS — I2583 Coronary atherosclerosis due to lipid rich plaque: Secondary | ICD-10-CM

## 2021-08-18 DIAGNOSIS — I2510 Atherosclerotic heart disease of native coronary artery without angina pectoris: Secondary | ICD-10-CM

## 2021-08-18 DIAGNOSIS — I10 Essential (primary) hypertension: Secondary | ICD-10-CM | POA: Diagnosis not present

## 2021-08-18 NOTE — Patient Instructions (Signed)
You may stop taking Plavix in June ?

## 2021-08-20 ENCOUNTER — Other Ambulatory Visit: Payer: Self-pay | Admitting: Cardiology

## 2021-08-22 ENCOUNTER — Other Ambulatory Visit: Payer: Self-pay

## 2021-08-22 NOTE — Telephone Encounter (Signed)
Spoke to patient Dr.Jordan's advice given.Advised to call back if he continues to have symptoms. ?

## 2021-08-22 NOTE — Telephone Encounter (Signed)
I would be ok with reducing Metoprolol back to prior dose. We were just trying to suppress PVCs but if this isn't bothering him much then 25 mg is OK ? ?Jyll Tomaro Martinique MD, [facility] ? ?

## 2021-08-27 NOTE — Progress Notes (Deleted)
f    Cardiology Office Note   Date:  08/27/2021   ID:  Keith Franklin, DOB Oct 05, 1960, MRN [id number]  PCP:  Fredirick Lathe, PA-C  Cardiologist:   Hong Moring Martinique, MD   No chief complaint on file.     History of Present Illness: Keith Franklin is a 61 y.o. male who presents for follow up CAD. He presented to [facility] in June 2022 with chest pain and was ruled in for ST elevated myocardial infarction.  He was taken emergently to the cardiac Cath Lab which showed a totally occluded circumflex.  We were able to reestablish flow in  the circumflex with POBA.  However, he was also noted to have severe LAD diagonal disease with moderate residual circumflex disease.  CABG was recommended.   He underwent CABG x3 on 10/03/2020.  While his lines were being placed and he was given sedation he had a bradycardic event with pulseless electrical activity cardiac arrest.  CPR was initiated.  It was performed for less than 2 minutes.  He had return of spontaneous circulation and was taken emergently to the operating room.    He received a LIMA-LAD, SVG to first diagonal and third obtuse marginal.   He was noted to have occasional monitor desaturations at night.  He was felt to potentially have OSA.  He continued to recover well.  He was discharged home in stable condition with his wife who is a retired Therapist, sports his daughter who is a Psychologist, sport and exercise on 10/07/2020.   He was seen in follow up in June and September.  Repeat Echo in October 2022 showed EF 40-45%. Progressing well. Noted insomnia. Sleep study planned. He was  seen by urology for hematuria and underwent cystoscopy and urethral dilation in December.   He has home sleep study showed moderate OSA. Recommended auto PAP titration at home. Now followed by Dr Rexene Alberts with good compliance noted on follow up in March.   He did report some recent palpitations due to PVCs. Reviewed tracings on Apple Watch. We recommended increasing his Toprol XL.   He is  working out at [employer] 6 days a week doing 45 minutes of aerobics and 40 minutes strength training. Denies any chest pain or SOB. Still has some insomnia. Notes easy bruising. Last A1c down to 5.4%. Really eating healthy.   Past Medical History:  Diagnosis Date   Cancer ([place]) 2010   skin cancer, Basal Cell Carcinoma    COVID-19 07/2019   GERD (gastroesophageal reflux disease)    History of kidney stones    Myocardial infarction ([place]) 09/30/2020    Past Surgical History:  Procedure Laterality Date   CORONARY ARTERY BYPASS GRAFT N/A 10/03/2020   Procedure: CORONARY ARTERY BYPASS GRAFTING (CABG) X THREE, ON PUMP, USING LEFT INTERNAL MAMMARY ARTERY AND RIGHT GREATER SAPHENOUS ENDOSCOPIC VEIN HARVEST CONDUITS;  Surgeon: Melrose Nakayama, MD;  Location: [place];  Service: Open Heart Surgery;  Laterality: N/A;  will harvest left radial artery for conduit [place]   CORONARY/GRAFT ACUTE MI REVASCULARIZATION N/A 09/30/2020   Procedure: Coronary/Graft Acute MI Revascularization;  Surgeon: Martinique, Nisreen Guise M, MD;  Location: [place] [facility];  Service: Cardiovascular;  Laterality: N/A;   CYSTOSCOPY WITH URETHRAL DILATATION N/A 04/10/2021   Procedure: CYSTOSCOPY WITH OPTILUME URETHRAL DILATATION;  Surgeon: Lucas Mallow, MD;  [facility];  Service: Urology;  Laterality: N/A;   LEFT HEART CATH AND CORONARY ANGIOGRAPHY N/A 09/30/2020   Procedure: LEFT HEART CATH AND CORONARY ANGIOGRAPHY;  Surgeon: Martinique, Mazelle Huebert M, MD;  Location: [place] [facility];  Service: Cardiovascular;  Laterality: N/A;   skin cancer removal     TONSILLECTOMY     WISDOM TOOTH EXTRACTION       Current Outpatient Medications  Medication Sig Dispense Refill   metoprolol succinate (TOPROL XL) 25 MG 24 hr tablet Take 1 tablet (25 mg total) by mouth daily.     acetaminophen (TYLENOL) 500 MG tablet Take 1,000 mg by mouth every 6 (six) hours as needed for moderate pain.     aspirin EC 81 MG EC tablet Take 1 tablet (81 mg  total) by mouth daily. Swallow whole.     atorvastatin (LIPITOR) 40 MG tablet TAKE 1 TABLET BY MOUTH EVERY DAY 90 tablet 2   clopidogrel (PLAVIX) 75 MG tablet TAKE 1 TABLET BY MOUTH EVERY DAY 90 tablet 3   tamsulosin (FLOMAX) 0.4 MG CAPS capsule Take 0.4 mg by mouth daily.     traMADol (ULTRAM) 50 MG tablet Take 1 tablet (50 mg total) by mouth every 6 (six) hours as needed. (Patient not taking: Reported on 08/18/2021) 30 tablet 5   No current facility-administered medications for this visit.    Allergies:   Patient has no known allergies.    Social History:  The patient  reports that he has never smoked. He has never used smokeless tobacco. He reports that he does not currently use alcohol. He reports that he does not use drugs.   Family History:  The patient's family history includes Cancer in his mother; Heart attack in his father.    ROS:  Please see the history of present illness.   Otherwise, review of systems are positive for none.   All other systems are reviewed and negative.    PHYSICAL EXAM: VS:  There were no vitals taken for this visit. , BMI There is no height or weight on file to calculate BMI. GEN: Well nourished, well developed, in no acute distress HEENT: normal Neck: no JVD, carotid bruits, or masses Cardiac: RRR; no murmurs, rubs, or gallops,no edema  Respiratory:  clear to auscultation bilaterally, normal work of breathing Chest with median sternotomy scar with diffuse keloid GI: soft, nontender, nondistended, + BS MS: no deformity or atrophy Skin: warm and dry, no rash Neuro:  Strength and sensation are intact Psych: euthymic mood, full affect   EKG:  EKG is not ordered today. The ekg ordered today demonstrates N/A   Recent Labs: 10/03/2020: ALT 36 10/04/2020: Magnesium 2.6 04/05/2021: BUN 19; Creatinine, Ser 0.93; Hemoglobin 14.8; Platelets 268; Potassium 4.1; Sodium 137    Lipid Panel    Component Value Date/Time   CHOL 96 03/29/2021 0812   TRIG 43.0  03/29/2021 0812   HDL 37.70 (L) 03/29/2021 0812   CHOLHDL 3 03/29/2021 0812   VLDL 8.6 03/29/2021 0812   LDLCALC 50 03/29/2021 0812   LDLCALC 32 01/13/2021 1516      Wt Readings from Last 3 Encounters:  08/18/21 240 lb 3.2 oz (109 kg)  07/18/21 247 lb 3.2 oz (112.1 kg)  07/06/21 248 lb 6.4 oz (112.7 kg)      Other studies Reviewed: Additional studies/ records that were reviewed today include:   Echocardiogram 10/01/2020 IMPRESSIONS     1. Left ventricular ejection fraction, by estimation, is 45 to 50%. The  left ventricle has mildly decreased function. The left ventricle has no  regional wall motion abnormalities. There is mild concentric left  ventricular hypertrophy. Left ventricular  diastolic  parameters are consistent with Grade I diastolic dysfunction  (impaired relaxation). There is moderate hypokinesis of the left  ventricular, mid-apical anterior wall. There is moderate hypokinesis of  the left ventricular, entire inferolateral  wall.   2. Right ventricular systolic function is normal. The right ventricular  size is normal.   3. Left atrial size was mildly dilated.   4. The mitral valve is normal in structure. No evidence of mitral valve  regurgitation. No evidence of mitral stenosis.   5. The aortic valve is tricuspid. Aortic valve regurgitation is not  visualized. No aortic stenosis is present.   6. There is mild dilatation of the ascending aorta, measuring 42 mm.   7. The inferior vena cava is normal in size with greater than 50%  respiratory variability, suggesting right atrial pressure of 3 mmHg.      Cardiac catheterization 10/27/2020 Prox LAD to Mid LAD lesion is 90% stenosed. Mid LAD lesion is 90% stenosed. 1st Diag lesion is 40% stenosed. Mid Cx to Dist Cx lesion is 100% stenosed. RV Branch lesion is 90% stenosed. 2nd RPL lesion is 70% stenosed. The left ventricular systolic function is normal. LV end diastolic pressure is normal. The left  ventricular ejection fraction is 50-55% by visual estimate. Post intervention, there is a 40% residual stenosis. Balloon angioplasty was performed using a BALLOON SAPPHIRE 2.5X12.   1. Severe 2 vessel obstructive CAD.    There is severe diffuse segmental disease in the proximal to mid LAD 90%    100% occlusion of the LCx distally with thrombus 2. EF 50-55% with mid inferior HK 3. Normal LV EDP 4. Successful PCI of the distal LCx with POBA.   Plan: will resume IV heparin in 8 hours. Continue IV Aggrastat. Initiate beta blocker and high dose statin. Continue IV Ntg. Will consult CT surgery for CABG this admission given severe LAD disease. The LAD and OM 2 and 3 appear to be acceptable targets.   Diagnostic Dominance: Right     Intervention         Echo 02/15/21: IMPRESSIONS     1. Left ventricular ejection fraction, by estimation, is 40 to 45%. The  left ventricle has mildly decreased function. The left ventricle  demonstrates global hypokinesis. Left ventricular diastolic parameters are  consistent with Grade II diastolic  dysfunction (pseudonormalization). The average left ventricular global  longitudinal strain is -11.6 %. The global longitudinal strain is  abnormal.   2. Right ventricular systolic function is mildly reduced. The right  ventricular size is moderately enlarged.   3. Left atrial size was mildly dilated.   4. Right atrial size was moderately dilated.   5. The mitral valve is normal in structure. Trivial mitral valve  regurgitation. No evidence of mitral stenosis.   6. The aortic valve is normal in structure. Aortic valve regurgitation is  not visualized. No aortic stenosis is present.   7. Aortic dilatation noted. There is mild dilatation of the aortic root,  measuring 40 mm. There is mild dilatation of the ascending aorta,  measuring 41 mm.   Comparison(s): EF 45-50%, mild concentric LVH, moderate hypokinesis of LV,  mid-apical anterior wall, entire  inferolateral wall. Ascending aorta 45m.    ASSESSMENT AND PLAN:  1.  Coronary artery disease- - admitted with STEMI with emergent POBA of the LCx - Status post CABG x3 10/03/2020.  Had episodes of PEA arrest prior to CABG.   - he has made a good recovery. - Continue aspirin, Plavix, atorvastatin,  metoprolol - will discontinue Plavix in June. -Heart healthy low-sodium diet -Increase physical activity as tolerated   2. Hyperlipidemia- LDL Cholesterol 50;  -  atorvastatin 40 mg   3. Essential hypertension  Well-controlled  Continue  metoprolol Heart healthy low-sodium diet-salty 6 given Increase physical activity as tolerated   4. OSA-moderate by in home sleep study Continue weight loss  Continue CPAP   5. Urethral stricture. S/p dilation.        Disposition:   FU with me in 6 months  Signed, Dierdre Mccalip Martinique, MD  08/27/2021 1:55 PM    [place] [address] [place], [place], [address] Phone [phone], Fax [phone]

## 2021-09-05 ENCOUNTER — Ambulatory Visit: Payer: 59 | Admitting: Cardiology

## 2021-11-07 ENCOUNTER — Encounter: Payer: Self-pay | Admitting: Cardiology

## 2021-11-07 NOTE — Telephone Encounter (Signed)
Spoke to patient Dr.Jordan's advice given.Stated he has been taking Metoprolol at night he will change and start taking in am.Advised to keep appointment with Dr.Jordan 10/26 at 9:20 am.Advised to call sooner if needed.

## 2022-01-18 ENCOUNTER — Ambulatory Visit (INDEPENDENT_AMBULATORY_CARE_PROVIDER_SITE_OTHER): Payer: PRIVATE HEALTH INSURANCE | Admitting: Physician Assistant

## 2022-01-18 ENCOUNTER — Encounter: Payer: Self-pay | Admitting: Physician Assistant

## 2022-01-18 VITALS — BP 114/77 | HR 65 | Temp 98.0°F | Ht 68.0 in | Wt 243.0 lb

## 2022-01-18 DIAGNOSIS — Z951 Presence of aortocoronary bypass graft: Secondary | ICD-10-CM | POA: Diagnosis not present

## 2022-01-18 DIAGNOSIS — R739 Hyperglycemia, unspecified: Secondary | ICD-10-CM

## 2022-01-18 DIAGNOSIS — Z9889 Other specified postprocedural states: Secondary | ICD-10-CM

## 2022-01-18 DIAGNOSIS — L910 Hypertrophic scar: Secondary | ICD-10-CM | POA: Diagnosis not present

## 2022-01-18 DIAGNOSIS — Z23 Encounter for immunization: Secondary | ICD-10-CM | POA: Diagnosis not present

## 2022-01-18 DIAGNOSIS — E7889 Other lipoprotein metabolism disorders: Secondary | ICD-10-CM | POA: Diagnosis not present

## 2022-01-18 LAB — COMPREHENSIVE METABOLIC PANEL
ALT: 16 U/L (ref 0–53)
AST: 16 U/L (ref 0–37)
Albumin: 4 g/dL (ref 3.5–5.2)
Alkaline Phosphatase: 60 U/L (ref 39–117)
BUN: 17 mg/dL (ref 6–23)
CO2: 27 mEq/L (ref 19–32)
Calcium: 9.2 mg/dL (ref 8.4–10.5)
Chloride: 105 mEq/L (ref 96–112)
Creatinine, Ser: 1.05 mg/dL (ref 0.40–1.50)
GFR: 76.45 mL/min (ref >60.00)
Glucose, Bld: 107 mg/dL — ABNORMAL HIGH (ref 70–99)
Potassium: 4.1 mEq/L (ref 3.5–5.1)
Sodium: 138 mEq/L (ref 135–145)
Total Bilirubin: 0.8 mg/dL (ref 0.2–1.2)
Total Protein: 6.7 g/dL (ref 6.0–8.3)

## 2022-01-18 LAB — LIPID PANEL
Cholesterol: 91 mg/dL (ref 0–200)
HDL: 41.4 mg/dL (ref >39.00)
LDL Cholesterol: 42 mg/dL (ref 0–99)
NonHDL: 49.45
Total CHOL/HDL Ratio: 2
Triglycerides: 37 mg/dL (ref 0.0–149.0)
VLDL: 7.4 mg/dL (ref 0.0–40.0)

## 2022-01-18 NOTE — Patient Instructions (Addendum)
Update labs today Flu shot today  First apply Scar Away - then Retin A - then moisturizer   Please discuss ATTR with cardiologist   Keep up great work!

## 2022-01-18 NOTE — Progress Notes (Signed)
Subjective:    Patient ID: Keith Franklin, male    DOB: 09-Aug-1960, 61 y.o.   MRN: [id number]  Chief Complaint  Patient presents with   Follow-up    Pt wants to discuss tingling in hands since pervious heart surgery. Pt wants to talk about the flu, shingles and covid shot.     HPI Patient is in today for 6 mo f/up.  Now almost a year and a half out from his myocardial infarction.  Patient continues to work on exercise.  States that he was starting to do too much and had to cut back.  He is now only doing 5 days about 60 minutes each session with half cardio and half strength.  He states that he continues to have tingling in both hands status post open heart surgery.  He is currently dealing with trigger thumb bilaterally and working with orthopedics on this.  Went to Dr. 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  Feels like his scar is improving and pain is much less severe since using Retin-A and after experiencing steroid injections.  He would like to recheck his fasting labs today. Past Medical History:  Diagnosis Date   Cancer ([place]) 2010   skin cancer, Basal Cell Carcinoma    COVID-19 07/2019   GERD (gastroesophageal reflux disease)    History of kidney stones    Myocardial infarction ([place]) 09/30/2020    Past Surgical History:  Procedure Laterality Date   CORONARY ARTERY BYPASS GRAFT N/A 10/03/2020   Procedure: CORONARY ARTERY BYPASS GRAFTING (CABG) X THREE, ON PUMP, USING LEFT INTERNAL MAMMARY ARTERY AND RIGHT GREATER SAPHENOUS ENDOSCOPIC VEIN HARVEST CONDUITS;  Surgeon: Melrose Nakayama, MD;  Location: [place];  Service: Open Heart Surgery;  Laterality: N/A;  will harvest left radial artery for conduit Swan   CORONARY/GRAFT ACUTE MI REVASCULARIZATION N/A 09/30/2020   Procedure: Coronary/Graft Acute MI Revascularization;  Surgeon: Martinique, Peter M, MD;  Location: [facility];  Service: Cardiovascular;  Laterality: N/A;   CYSTOSCOPY WITH URETHRAL  DILATATION N/A 04/10/2021   Procedure: CYSTOSCOPY WITH OPTILUME URETHRAL DILATATION;  Surgeon: Lucas Mallow, MD;  Location: [facility];  Service: Urology;  Laterality: N/A;   LEFT HEART CATH AND CORONARY ANGIOGRAPHY N/A 09/30/2020   Procedure: LEFT HEART CATH AND CORONARY ANGIOGRAPHY;  Surgeon: Martinique, Peter M, MD;  Location: [facility];  Service: Cardiovascular;  Laterality: N/A;   skin cancer removal     TONSILLECTOMY     WISDOM TOOTH EXTRACTION      Family History  Problem Relation Age of Onset   Cancer Mother    Heart attack Father    Sleep apnea Neg Hx    Colon cancer Neg Hx     Social History   Tobacco Use   Smoking status: Never   Smokeless tobacco: Never  Vaping Use   Vaping Use: Never used  Substance Use Topics   Alcohol use: Not Currently    Comment: rare   Drug use: Never     No Known Allergies  Review of Systems NEGATIVE UNLESS OTHERWISE INDICATED IN HPI      Objective:     BP 114/77 (BP Location: Left Arm, Patient Position: Sitting)   Pulse 65   Temp 98 F (36.7 C) (Temporal)   Ht '5\' 8"'$  (1.727 m)   Wt 243 lb (110.2 kg)   SpO2 98%   BMI 36.95 kg/m   Wt Readings from Last 3 Encounters:  01/18/22  243 lb (110.2 kg)  08/18/21 240 lb 3.2 oz (109 kg)  07/18/21 247 lb 3.2 oz (112.1 kg)    BP Readings from Last 3 Encounters:  01/18/22 114/77  08/18/21 118/74  07/18/21 110/62     Physical Exam Vitals and nursing note reviewed.  Constitutional:      General: He is not in acute distress.    Appearance: Normal appearance. He is not toxic-appearing.  HENT:     Head: Normocephalic and atraumatic.     Right Ear: External ear normal.     Left Ear: External ear normal.     Nose: Nose normal.     Mouth/Throat:     Mouth: Mucous membranes are moist.     Pharynx: Oropharynx is clear.  Eyes:     Extraocular Movements: Extraocular movements intact.     Conjunctiva/sclera: Conjunctivae normal.     Pupils: Pupils are equal, round, and  reactive to light.  Cardiovascular:     Rate and Rhythm: Normal rate and regular rhythm.     Pulses: Normal pulses.     Heart sounds: Normal heart sounds.  Pulmonary:     Effort: Pulmonary effort is normal.     Breath sounds: Normal breath sounds.  Chest:     Comments: Vertical scar chest wall s/p CABG with keloid - much improved since last visit Abdominal:     General: Abdomen is flat. Bowel sounds are normal.     Palpations: Abdomen is soft.     Tenderness: There is no abdominal tenderness.  Musculoskeletal:        General: Normal range of motion.     Cervical back: Normal range of motion and neck supple.  Skin:    General: Skin is warm and dry.  Neurological:     General: No focal deficit present.     Mental Status: He is alert and oriented to person, place, and time.  Psychiatric:        Mood and Affect: Mood normal.        Behavior: Behavior normal.        Assessment & Plan:  S/P CABG x 3 -     Comprehensive metabolic panel -     Lipid panel  Lipids abnormal Assessment & Plan: Updating labs today.  Currently taking Lipitor 40 mg daily.  Orders: -     Lipid panel  Hyperglycemia Assessment & Plan: Plan to recheck CMP today, patient is fasting.  Do not intend to recheck hemoglobin A1c as he has been well under diabetic range for the last year now. Lab Results  Component Value Date   HGBA1C 5.4 07/18/2021     Orders: -     Comprehensive metabolic panel  Postoperative keloid scar Assessment & Plan: Improving Following with derm First apply Scar Away - then Retin A - then moisturizer     Need for immunization against influenza -     Flu Vaccine QUAD 42moIM (Fluarix, Fluzone & Alfiuria Quad PF)        Return in about 6 months (around 07/19/2022) for recheck .  This note was prepared with assistance of [employer] analyst Occasional wrong-word or sound-a-like substitutions may have occurred due to the inherent limitations of voice  recognition software.    Xareni Kelch M Hailea Eaglin, PA-C

## 2022-01-21 DIAGNOSIS — L910 Hypertrophic scar: Secondary | ICD-10-CM | POA: Insufficient documentation

## 2022-01-21 DIAGNOSIS — E7889 Other lipoprotein metabolism disorders: Secondary | ICD-10-CM | POA: Insufficient documentation

## 2022-01-21 NOTE — Assessment & Plan Note (Signed)
Plan to recheck CMP today, patient is fasting.  Do not intend to recheck hemoglobin A1c as he has been well under diabetic range for the last year now. Lab Results  Component Value Date   HGBA1C 5.4 07/18/2021

## 2022-01-21 NOTE — Assessment & Plan Note (Signed)
Updating labs today.  Currently taking Lipitor 40 mg daily.

## 2022-01-21 NOTE — Assessment & Plan Note (Signed)
Improving Following with derm First apply Scar Away - then Retin A - then moisturizer

## 2022-02-02 ENCOUNTER — Ambulatory Visit: Payer: PRIVATE HEALTH INSURANCE | Admitting: Physician Assistant

## 2022-02-02 ENCOUNTER — Telehealth (INDEPENDENT_AMBULATORY_CARE_PROVIDER_SITE_OTHER): Payer: PRIVATE HEALTH INSURANCE | Admitting: Physician Assistant

## 2022-02-02 ENCOUNTER — Telehealth: Payer: Self-pay | Admitting: Physician Assistant

## 2022-02-02 ENCOUNTER — Encounter: Payer: Self-pay | Admitting: Physician Assistant

## 2022-02-02 DIAGNOSIS — U071 COVID-19: Secondary | ICD-10-CM

## 2022-02-02 MED ORDER — NIRMATRELVIR/RITONAVIR (PAXLOVID)TABLET: 3.0000 | ORAL_TABLET | Freq: Two times a day (BID) | ORAL | 0 refills | Status: AC

## 2022-02-02 NOTE — Progress Notes (Signed)
   Virtual Visit via Video Note  I connected with  Keith Franklin  on 02/02/22 at 11:30 AM EDT by a video enabled telemedicine application and verified that I am speaking with the correct person using two identifiers.  Location: Patient: home Provider: Therapist, music at [place] present: Patient and myself   I discussed the limitations of evaluation and management by telemedicine and the availability of in person appointments. The patient expressed understanding and agreed to proceed.   History of Present Illness:  Chief complaint: COVID-19 positive test this morning at home Symptom onset: 02/01/22 Pertinent positives: Fever 100 F Tmax, body aches, chills, ST, cough, nasal congestion Pertinent negatives: SOB, chest pain  Treatments tried: Tylenol  Vaccine status: hasn't had latest covid booster  Sick exposure: Recent travel to [place], co-worker positive as well    Observations/Objective:  SpO2 -98% HR -59 bpm  Gen: Awake, alert, no acute distress, some congestion noted and slight cough  Resp: Breathing is even and non-labored Psych: calm/pleasant demeanor Neuro: Alert and Oriented x 3, + facial symmetry, speech is clear.   Assessment and Plan:  1. COVID-19 Diagnosis confirmed via home antigen test. We discussed current algorithm recommendations for prescribing outpatient antivirals.  He is within 5 day window of symptom onset. Will start Paxlovid. Risks versus benefits discussed.  Hold statin medication through treatment course and additional 5 days.  Advised self-isolation at home for the next 5 days and then masking around others for at least an additional 5 days.  Treat supportively at this time including sleeping prone, deep breathing exercises, pushing fluids, walking every few hours, vitamins C and D, and Tylenol or ibuprofen as needed.  The patient understands that COVID-19 illness can wax and wane.  Should the symptoms acutely worsen or patient  starts to experience sudden shortness of breath, chest pain, severe weakness, the patient will go straight to the emergency department.  Also advised home pulse oximetry monitoring and for any reading consistently under 92%, should also report to the emergency department.  The patient will continue to keep [name] updated.   Follow Up Instructions:    I discussed the assessment and treatment plan with the patient. The patient was provided an opportunity to ask questions and all were answered. The patient agreed with the plan and demonstrated an understanding of the instructions.   The patient was advised to call back or seek an in-person evaluation if the symptoms worsen or if the condition fails to improve as anticipated.  Zyron Deeley M Sita Mangen, PA-C

## 2022-02-02 NOTE — Patient Instructions (Signed)
Take Paxlovid as directed  Hold cholesterol medicine during treatment + another 5 days after completing Paxlovid.

## 2022-02-02 NOTE — Telephone Encounter (Signed)
Patient Name: Keith Franklin [facility] Gender: Male DOB: [birth year] Age: 61 Y 62 M 10 D Return Phone Number: [phone] (Primary) Address: City/ State/ Zip: [place]  [address] Client [place] [facility] [place] Client Site [place] [facility] [employer] Type Call Who Is Calling Patient / Member / Family / Caregiver Call Type Triage / Clinical Relationship To Patient Self Return Phone Number [phone] (Primary) Chief Complaint Sore Throat Reason for Call Symptomatic / Request for [place] has been traveling and tested positive this morning for covid. Symptoms started about 24hrs ago with sore throat and low grade fever and chill and body aches, caller asking for Paxlovid Translation No Nurse Assessment Nurse: Toy Cookey, RN, [facility] Date/Time Eilene Ghazi Time): 02/02/2022 8:36:25 AM Confirm and document reason for call. If symptomatic, describe symptoms. ---Caller states he tested positive for COVID. Temp of 99.8, sore throat, congestion/runny nose, chills, body aches. Mild cough. Sx started yesterday, tested positive today. Pharmacy [facility] on [address]. Requesting Paxlovid Does the patient have any new or worsening symptoms? ---Yes Will a triage be completed? ---Yes Related visit to physician within the last 2 weeks? ---No Does the PT have any chronic conditions? (i.e. diabetes, asthma, this includes High risk factors for pregnancy, etc.) ---Yes List chronic conditions. ---[place] post MI. (on a statin also) Is this a behavioral health or substance abuse call? ---No Guidelines Guideline Title Affirmed Question Affirmed Notes Nurse Date/Time ([place] Time) COVID-19 - Diagnosed or Suspected [1] HIGH RISK patient (e.g., weak immune system, age > 77 years, obesity with BMI 30 or higher, pregnant, Bonnetta Barry 02/02/2022 8:38:21 AM   Guidelines Guideline Title Affirmed Question  Affirmed Notes Nurse Date/Time (Eastern Time) chronic lung disease or other chronic medical condition) AND [2] COVID symptoms (e.g., cough, fever) (Exceptions: Already seen by PCP and no new or worsening symptoms.) Disp. Time Eilene Ghazi Time) Disposition Final User 02/02/2022 8:41:39 AM Call PCP within 24 Hours Yes Toy Cookey RN, [facility] Final Disposition 02/02/2022 8:41:39 AM Call PCP within 24 Hours Yes Toy Cookey, RN, [facility] Disagree/Comply Comply Caller Understands Yes PreDisposition Call Doctor Care Advice Given Per Guideline CALL PCP WITHIN 24 HOURS: * You need to discuss this with your doctor (or NP/PA) within the next 24 hours. CALL BACK IF: * You become worse Comments User: Dulcy Fanny, RN Date/Time Eilene Ghazi Time): 02/02/2022 8:43:43 AM Notified patient I would request office to call him back directly about Medication request if able. However patient states he has an appointment with MD today at 1400 just in case he needs to be seen.

## 2022-02-02 NOTE — Telephone Encounter (Signed)
Pt scheduled for VV this morning with PCP

## 2022-02-09 IMAGING — CT CT RENAL STONE PROTOCOL
2 of 4 series · 17 of 46 positions shown, 19 images · non-contrast
Comparison: 10/25/2015

CLINICAL DATA: Hematuria, left flank pain

EXAM:
CT ABDOMEN AND PELVIS WITHOUT CONTRAST
TECHNIQUE: Multidetector CT imaging of the abdomen and pelvis was performed
following the standard protocol without IV contrast.

[Series 3: ap without · axial · non-contrast · 0.89mm/px · z∈[-244,+192]mm · 14 of 99 slices shown, 16 images]
[im 6/99  soft-tissue]
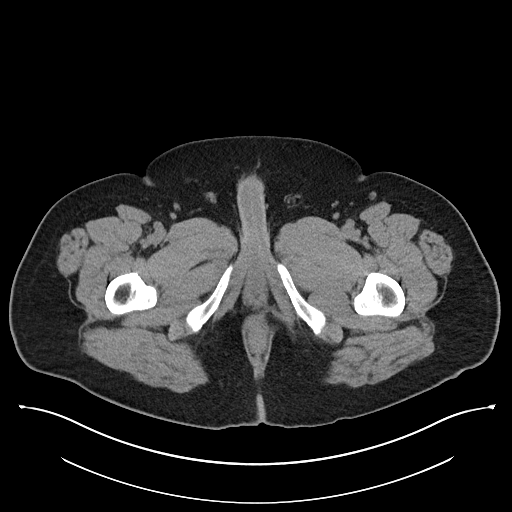
[im 6/99  bone]
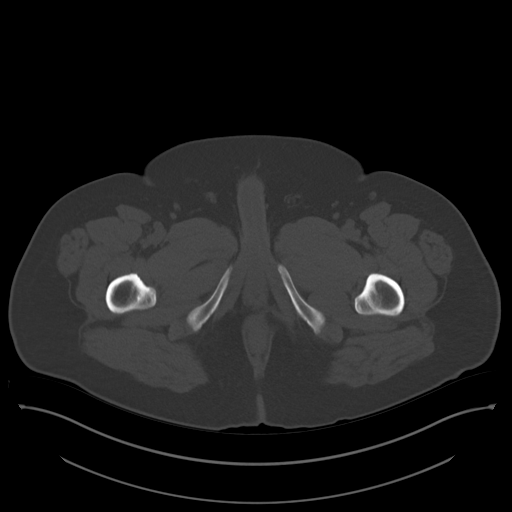
[im 11/99  soft-tissue]
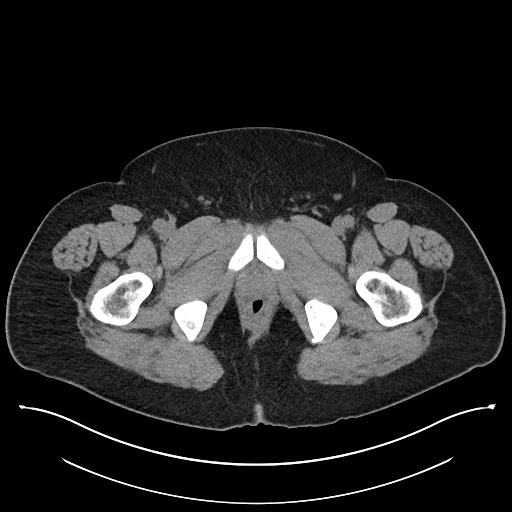
[im 22/99  soft-tissue]
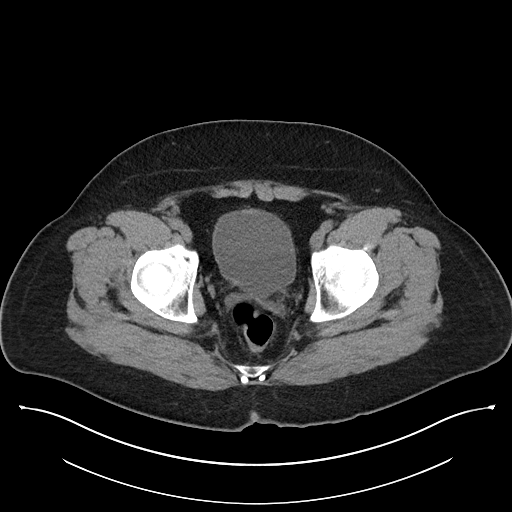
[im 28/99  soft-tissue]
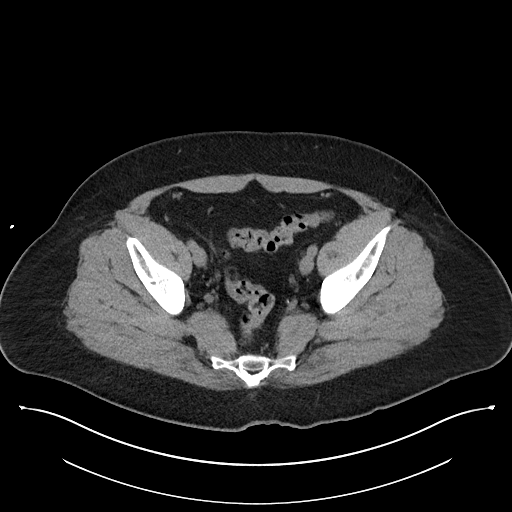
[im 33/99  soft-tissue]
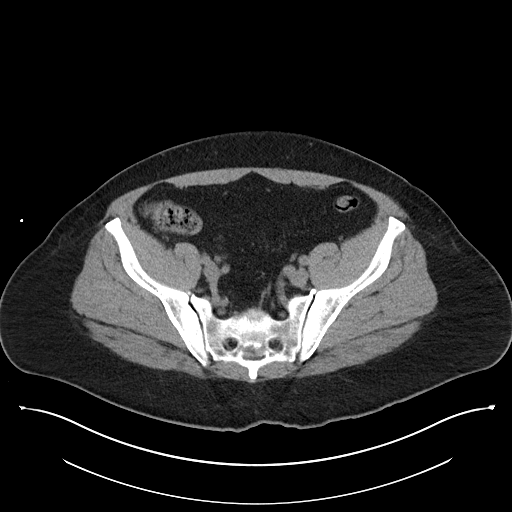
[im 39/99  soft-tissue]
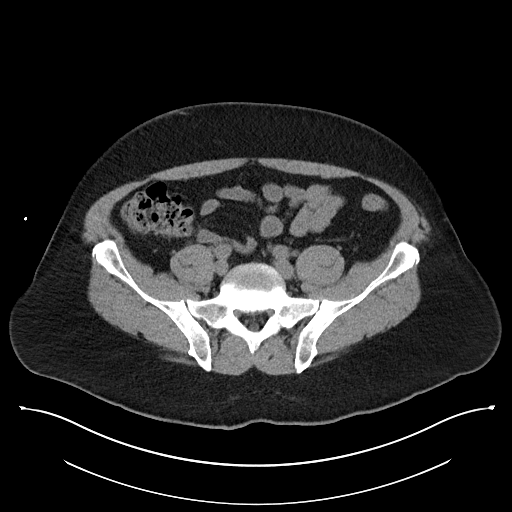
[im 44/99  soft-tissue]
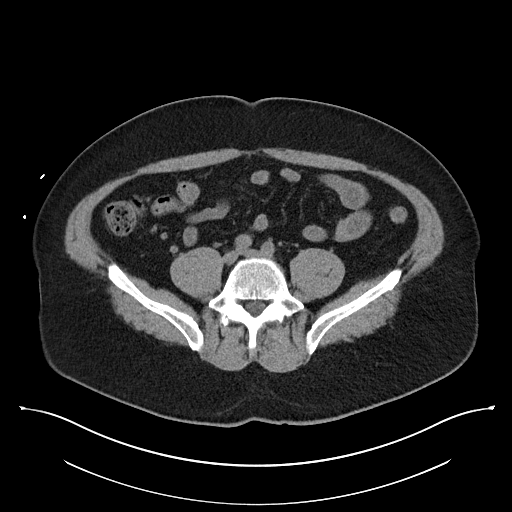
[im 55/99  soft-tissue]
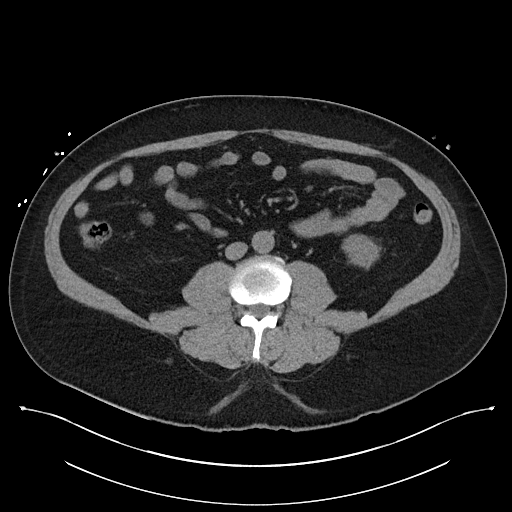
[im 60/99  soft-tissue]
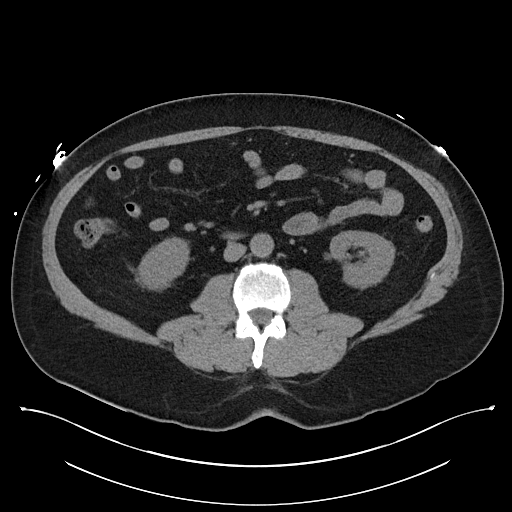
[im 60/99  bone]
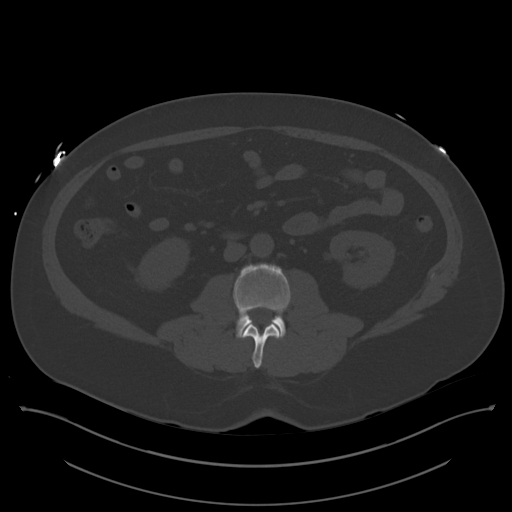
[im 66/99  soft-tissue]
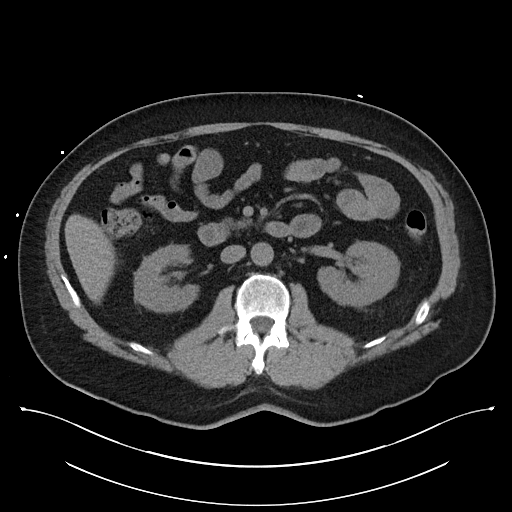
[im 71/99  soft-tissue]
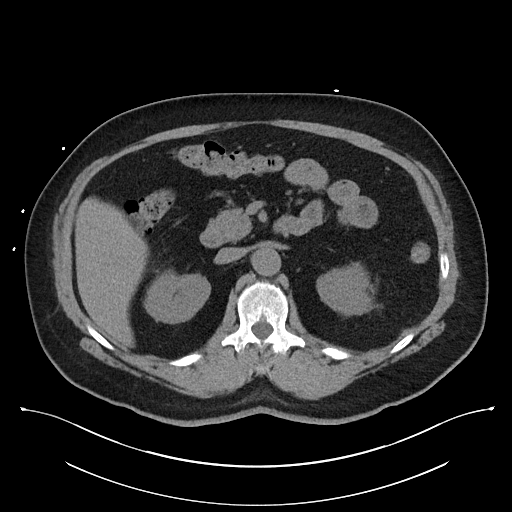
[im 77/99  soft-tissue]
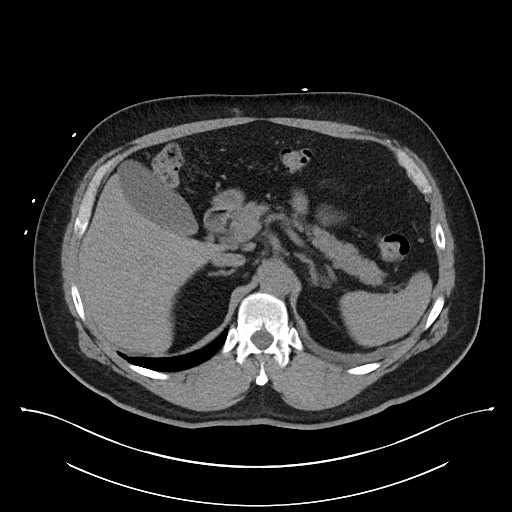
[im 88/99  soft-tissue]
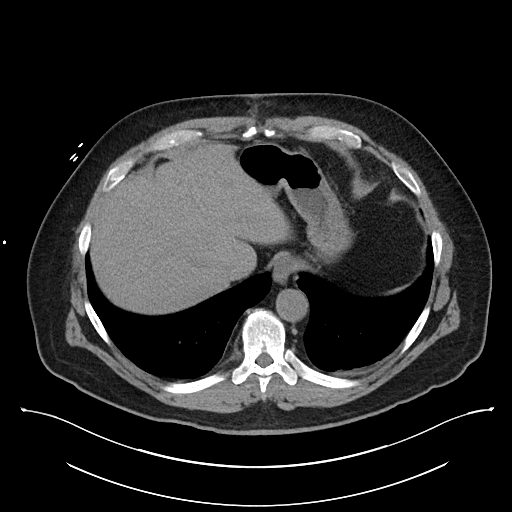
[im 93/99  soft-tissue]
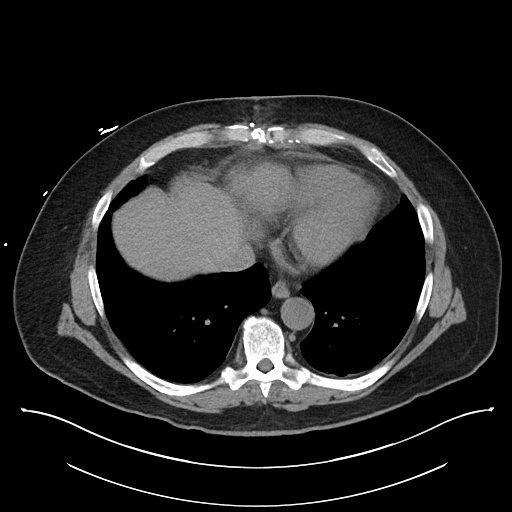

[Series 6: cor · coronal · 0.92mm/px · 3 of 98 slices shown]
[im 33/98  soft-tissue]
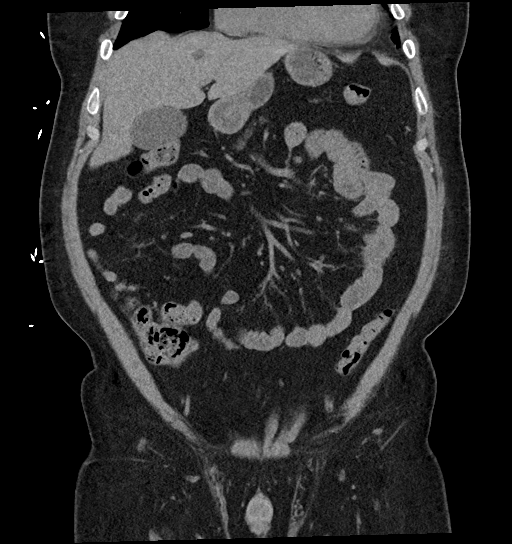
[im 44/98  soft-tissue]
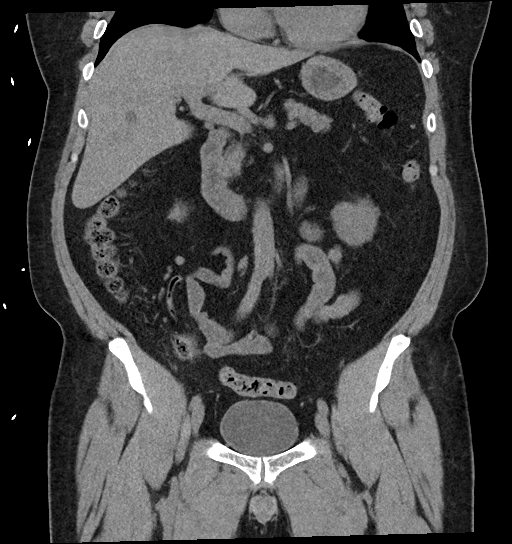
[im 54/98  soft-tissue]
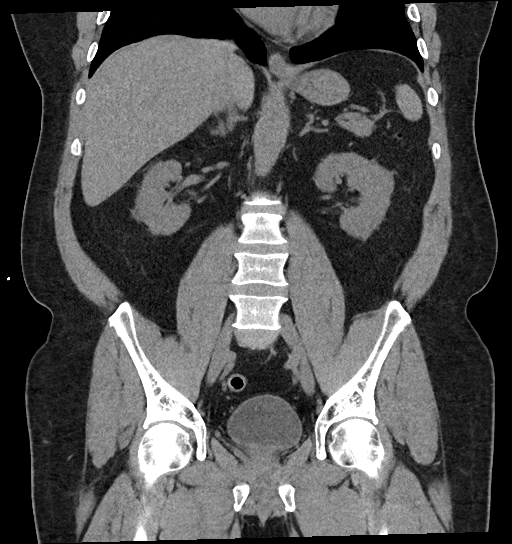

[17 of 46 positions shown; findings below may reference images not displayed]

FINDINGS: Lower chest: Trace left pleural effusion.

Hepatobiliary: Two hepatic cysts measuring up to 13 mm (series
3/image 9).

Gallbladder is unremarkable. No intrahepatic or extrahepatic ductal
dilatation.

Pancreas: Within normal limits.

Spleen: Within normal limits.

Adrenals/Urinary Tract: Adrenal glands are within normal limits.

Kidneys are within normal limits. No renal, ureteral, or bladder
calculi. No hydronephrosis.

Bladder is within normal limits.

Stomach/Bowel: Stomach is within normal limits.

No evidence of bowel obstruction.

Normal appendix (series 3/image 56).

Vascular/Lymphatic: No evidence of abdominal aortic aneurysm.

Mild atherosclerotic calcifications of the bilateral iliac arteries.

No suspicious abdominopelvic lymphadenopathy.

Reproductive: Prostate is unremarkable.

Other: No abdominopelvic ascites.

Musculoskeletal: Mild degenerative changes of the visualized
thoracolumbar spine. Median sternotomy, incompletely visualized.
IMPRESSION: No renal, ureteral, or bladder calculi.  No hydronephrosis.

No CT findings to account for the patient's left flank pain or
hematuria.

## 2022-02-19 NOTE — Progress Notes (Unsigned)
f    Cardiology Office Note   Date:  02/22/2022   ID:  Keith Franklin, DOB 06-22-1960, MRN [id number]  PCP:  Fredirick Lathe, PA-C  Cardiologist:   Shaunae Sieloff Martinique, MD   Chief Complaint  Patient presents with   Follow-up   Coronary Artery Disease      History of Present Illness: Keith Franklin is a 61 y.o. male who presents for follow up CAD. He presented to [facility] in June 2022 with chest pain and was ruled in for ST elevated myocardial infarction.  He was taken emergently to the cardiac Cath Lab which showed a totally occluded circumflex.  We were able to reestablish flow in  the circumflex with POBA.  However, he was also noted to have severe LAD diagonal disease with moderate residual circumflex disease.  CABG was recommended.   He underwent CABG x3 on 10/03/2020.  While his lines were being placed and he was given sedation he had a bradycardic event with pulseless electrical activity cardiac arrest.  CPR was initiated.  It was performed for less than 2 minutes.  He had return of spontaneous circulation and was taken emergently to the operating room.    He received a LIMA-LAD, SVG to first diagonal and third obtuse marginal.   He was noted to have occasional monitor desaturations at night.  He was felt to potentially have OSA.  He continued to recover well.  He was discharged home in stable condition with his wife who is a retired Therapist, sports his daughter who is a Psychologist, sport and exercise on 10/07/2020.   He was seen in follow up in June and September.  Repeat Echo in October 2022 showed EF 40-45%. Progressing well. Noted insomnia. Sleep study planned. He was  seen by urology for hematuria and underwent cystoscopy and urethral dilation in December.   He has home sleep study showed moderate OSA. Recommended auto PAP titration at home. Now followed by Dr Rexene Alberts with good compliance noted on follow up in March.   He did report some recent palpitations due to PVCs. Reviewed tracings on Apple  Watch. We recommended increasing his Toprol XL.   He is working out regularly at [employer] 4 days a week doing 45 minutes of aerobics and 40 minutes strength training. Denies any chest pain or SOB. Frustrated that he hasn't lost weight but has increased his muscle mass.  Last A1c down to 5.4%. Really eating healthy. Has bilateral trigger thumb. Was concerned about TTR amyloid since he has read about this.   Past Medical History:  Diagnosis Date   Cancer (Uncertain) 2010   skin cancer, Basal Cell Carcinoma    COVID-19 07/2019   GERD (gastroesophageal reflux disease)    History of kidney stones    Myocardial infarction ([place]) 09/30/2020    Past Surgical History:  Procedure Laterality Date   CORONARY ARTERY BYPASS GRAFT N/A 10/03/2020   Procedure: CORONARY ARTERY BYPASS GRAFTING (CABG) X THREE, ON PUMP, USING LEFT INTERNAL MAMMARY ARTERY AND RIGHT GREATER SAPHENOUS ENDOSCOPIC VEIN HARVEST CONDUITS;  Surgeon: Melrose Nakayama, MD;  Location: [facility];  Service: Open Heart Surgery;  Laterality: N/A;  will harvest left radial artery for conduit Swan   CORONARY/GRAFT ACUTE MI REVASCULARIZATION N/A 09/30/2020   Procedure: Coronary/Graft Acute MI Revascularization;  Surgeon: Martinique, Orlinda Slomski M, MD;  Location: [facility];  Service: Cardiovascular;  Laterality: N/A;   CYSTOSCOPY WITH URETHRAL DILATATION N/A 04/10/2021   Procedure: CYSTOSCOPY WITH OPTILUME URETHRAL DILATATION;  Surgeon: Link Snuffer  D III, MD;  Location: [facility];  Service: Urology;  Laterality: N/A;   LEFT HEART CATH AND CORONARY ANGIOGRAPHY N/A 09/30/2020   Procedure: LEFT HEART CATH AND CORONARY ANGIOGRAPHY;  Surgeon: Martinique, Thessaly Mccullers M, MD;  Location: [facility];  Service: Cardiovascular;  Laterality: N/A;   skin cancer removal     TONSILLECTOMY     WISDOM TOOTH EXTRACTION       Current Outpatient Medications  Medication Sig Dispense Refill   acetaminophen (TYLENOL) 500 MG tablet Take 1,000 mg by mouth every 6 (six) hours  as needed for moderate pain.     aspirin EC 81 MG EC tablet Take 1 tablet (81 mg total) by mouth daily. Swallow whole.     atorvastatin (LIPITOR) 40 MG tablet TAKE 1 TABLET BY MOUTH EVERY DAY 90 tablet 2   metoprolol succinate (TOPROL XL) 25 MG 24 hr tablet Take 1 tablet (25 mg total) by mouth daily.     tamsulosin (FLOMAX) 0.4 MG CAPS capsule Take 0.4 mg by mouth daily.     No current facility-administered medications for this visit.    Allergies:   Patient has no known allergies.    Social History:  The patient  reports that he has never smoked. He has never used smokeless tobacco. He reports that he does not currently use alcohol. He reports that he does not use drugs.   Family History:  The patient's family history includes Cancer in his mother; Heart attack in his father.    ROS:  Please see the history of present illness.   Otherwise, review of systems are positive for none.   All other systems are reviewed and negative.    PHYSICAL EXAM: VS:  BP 130/82 (BP Location: Left Arm, Patient Position: Sitting, Cuff Size: Large)   Pulse 86   Ht '5\' 8"'$  (1.727 m)   Wt 245 lb 9.6 oz (111.4 kg)   SpO2 96%   BMI 37.34 kg/m  , BMI Body mass index is 37.34 kg/m. GEN: Well nourished, well developed, in no acute distress HEENT: normal Neck: no JVD, carotid bruits, or masses Cardiac: RRR; no murmurs, rubs, or gallops,no edema  Respiratory:  clear to auscultation bilaterally, normal work of breathing Chest with median sternotomy scar with diffuse keloid GI: soft, nontender, nondistended, + BS MS: no deformity or atrophy Skin: warm and dry, no rash Neuro:  Strength and sensation are intact Psych: euthymic mood, full affect   EKG:  EKG is  ordered today. The ekg ordered today demonstrates NSR rate 86. LAD. Poor R wave progression anteriorly. I have personally reviewed and interpreted this study.    Recent Labs: 04/05/2021: Hemoglobin 14.8; Platelets 268 01/18/2022: ALT 16; BUN 17;  Creatinine, Ser 1.05; Potassium 4.1; Sodium 138    Lipid Panel    Component Value Date/Time   CHOL 91 01/18/2022 0912   TRIG 37.0 01/18/2022 0912   HDL 41.40 01/18/2022 0912   CHOLHDL 2 01/18/2022 0912   VLDL 7.4 01/18/2022 0912   LDLCALC 42 01/18/2022 0912   LDLCALC 32 01/13/2021 1516      Wt Readings from Last 3 Encounters:  02/22/22 245 lb 9.6 oz (111.4 kg)  01/18/22 243 lb (110.2 kg)  08/18/21 240 lb 3.2 oz (109 kg)      Other studies Reviewed: Additional studies/ records that were reviewed today include:   Echocardiogram 10/01/2020 IMPRESSIONS     1. Left ventricular ejection fraction, by estimation, is 45 to 50%. The  left ventricle  has mildly decreased function. The left ventricle has no  regional wall motion abnormalities. There is mild concentric left  ventricular hypertrophy. Left ventricular  diastolic parameters are consistent with Grade I diastolic dysfunction  (impaired relaxation). There is moderate hypokinesis of the left  ventricular, mid-apical anterior wall. There is moderate hypokinesis of  the left ventricular, entire inferolateral  wall.   2. Right ventricular systolic function is normal. The right ventricular  size is normal.   3. Left atrial size was mildly dilated.   4. The mitral valve is normal in structure. No evidence of mitral valve  regurgitation. No evidence of mitral stenosis.   5. The aortic valve is tricuspid. Aortic valve regurgitation is not  visualized. No aortic stenosis is present.   6. There is mild dilatation of the ascending aorta, measuring 42 mm.   7. The inferior vena cava is normal in size with greater than 50%  respiratory variability, suggesting right atrial pressure of 3 mmHg.      Cardiac catheterization 10/27/2020 Prox LAD to Mid LAD lesion is 90% stenosed. Mid LAD lesion is 90% stenosed. 1st Diag lesion is 40% stenosed. Mid Cx to Dist Cx lesion is 100% stenosed. RV Branch lesion is 90% stenosed. 2nd RPL lesion  is 70% stenosed. The left ventricular systolic function is normal. LV end diastolic pressure is normal. The left ventricular ejection fraction is 50-55% by visual estimate. Post intervention, there is a 40% residual stenosis. Balloon angioplasty was performed using a BALLOON SAPPHIRE 2.5X12.   1. Severe 2 vessel obstructive CAD.    There is severe diffuse segmental disease in the proximal to mid LAD 90%    100% occlusion of the LCx distally with thrombus 2. EF 50-55% with mid inferior HK 3. Normal LV EDP 4. Successful PCI of the distal LCx with POBA.   Plan: will resume IV heparin in 8 hours. Continue IV Aggrastat. Initiate beta blocker and high dose statin. Continue IV Ntg. Will consult CT surgery for CABG this admission given severe LAD disease. The LAD and OM 2 and 3 appear to be acceptable targets.   Diagnostic Dominance: Right     Intervention         Echo 02/15/21: IMPRESSIONS     1. Left ventricular ejection fraction, by estimation, is 40 to 45%. The  left ventricle has mildly decreased function. The left ventricle  demonstrates global hypokinesis. Left ventricular diastolic parameters are  consistent with Grade II diastolic  dysfunction (pseudonormalization). The average left ventricular global  longitudinal strain is -11.6 %. The global longitudinal strain is  abnormal.   2. Right ventricular systolic function is mildly reduced. The right  ventricular size is moderately enlarged.   3. Left atrial size was mildly dilated.   4. Right atrial size was moderately dilated.   5. The mitral valve is normal in structure. Trivial mitral valve  regurgitation. No evidence of mitral stenosis.   6. The aortic valve is normal in structure. Aortic valve regurgitation is  not visualized. No aortic stenosis is present.   7. Aortic dilatation noted. There is mild dilatation of the aortic root,  measuring 40 mm. There is mild dilatation of the ascending aorta,  measuring 41 mm.    Comparison(s): EF 45-50%, mild concentric LVH, moderate hypokinesis of LV,  mid-apical anterior wall, entire inferolateral wall. Ascending aorta 55m.    ASSESSMENT AND PLAN:  1.  Coronary artery disease- - admitted with STEMI with emergent POBA of the LCx - Status post  CABG x3 10/03/2020.  Had episodes of PEA arrest prior to CABG.   - he has made an excellent recovery. - Continue aspirin, atorvastatin, metoprolol -Heart healthy low-sodium diet -Increase physical activity as tolerated   2. Hyperlipidemia- LDL Cholesterol 42 -  atorvastatin 40 mg   3. Essential hypertension  Well-controlled  Continue  metoprolol Heart healthy low-sodium diet Continue aerobic physical activity    4. OSA-moderate by in home sleep study Continue weight loss  Continue CPAP   5. Urethral stricture. S/p dilation.     He really has no features of TTR amyloid based on exam, Ecg, or Echo. Reassured.    Disposition:   FU with me in 6 months  Signed, Trinitey Roache Martinique, MD  02/22/2022 9:30 AM    [facility] [address] [place], [place], [address] Phone [phone], Fax [phone]

## 2022-02-22 ENCOUNTER — Ambulatory Visit: Payer: PRIVATE HEALTH INSURANCE | Attending: Cardiology | Admitting: Cardiology

## 2022-02-22 ENCOUNTER — Encounter: Payer: Self-pay | Admitting: Cardiology

## 2022-02-22 VITALS — BP 130/82 | HR 86 | Ht 68.0 in | Wt 245.6 lb

## 2022-02-22 DIAGNOSIS — I2583 Coronary atherosclerosis due to lipid rich plaque: Secondary | ICD-10-CM

## 2022-02-22 DIAGNOSIS — I2510 Atherosclerotic heart disease of native coronary artery without angina pectoris: Secondary | ICD-10-CM | POA: Diagnosis not present

## 2022-02-22 DIAGNOSIS — G4733 Obstructive sleep apnea (adult) (pediatric): Secondary | ICD-10-CM

## 2022-02-22 DIAGNOSIS — I10 Essential (primary) hypertension: Secondary | ICD-10-CM | POA: Diagnosis not present

## 2022-02-22 DIAGNOSIS — Z951 Presence of aortocoronary bypass graft: Secondary | ICD-10-CM | POA: Diagnosis not present

## 2022-03-13 ENCOUNTER — Telehealth: Payer: Self-pay | Admitting: Student

## 2022-03-13 ENCOUNTER — Other Ambulatory Visit: Payer: Self-pay

## 2022-03-13 ENCOUNTER — Telehealth: Payer: Self-pay | Admitting: Cardiology

## 2022-03-13 ENCOUNTER — Emergency Department (HOSPITAL_BASED_OUTPATIENT_CLINIC_OR_DEPARTMENT_OTHER): Payer: 59 | Admitting: Radiology

## 2022-03-13 ENCOUNTER — Emergency Department (HOSPITAL_BASED_OUTPATIENT_CLINIC_OR_DEPARTMENT_OTHER)
Admission: EM | Admit: 2022-03-13 | Discharge: 2022-03-13 | Disposition: A | Discharge status: Home / Self Care | Payer: 59 | Attending: Emergency Medicine | Admitting: Emergency Medicine

## 2022-03-13 ENCOUNTER — Encounter (HOSPITAL_BASED_OUTPATIENT_CLINIC_OR_DEPARTMENT_OTHER): Payer: Self-pay

## 2022-03-13 DIAGNOSIS — Z951 Presence of aortocoronary bypass graft: Secondary | ICD-10-CM | POA: Diagnosis not present

## 2022-03-13 DIAGNOSIS — R0789 Other chest pain: Secondary | ICD-10-CM | POA: Insufficient documentation

## 2022-03-13 DIAGNOSIS — R319 Hematuria, unspecified: Secondary | ICD-10-CM | POA: Insufficient documentation

## 2022-03-13 DIAGNOSIS — Z7982 Long term (current) use of aspirin: Secondary | ICD-10-CM | POA: Insufficient documentation

## 2022-03-13 DIAGNOSIS — I2510 Atherosclerotic heart disease of native coronary artery without angina pectoris: Secondary | ICD-10-CM | POA: Insufficient documentation

## 2022-03-13 DIAGNOSIS — R079 Chest pain, unspecified: Secondary | ICD-10-CM | POA: Diagnosis present

## 2022-03-13 LAB — URINALYSIS, ROUTINE W REFLEX MICROSCOPIC
Bilirubin Urine: NEGATIVE
Glucose, UA: NEGATIVE mg/dL
Ketones, ur: NEGATIVE mg/dL
Leukocytes,Ua: NEGATIVE
Nitrite: NEGATIVE
Protein, ur: NEGATIVE mg/dL
Specific Gravity, Urine: 1.013 (ref 1.005–1.030)
pH: 6 (ref 5.0–8.0)

## 2022-03-13 LAB — BASIC METABOLIC PANEL
Anion gap: 10 (ref 5–15)
BUN: 18 mg/dL (ref 8–23)
CO2: 24 mmol/L (ref 22–32)
Calcium: 9.7 mg/dL (ref 8.9–10.3)
Chloride: 105 mmol/L (ref 98–111)
Creatinine, Ser: 1.26 mg/dL — ABNORMAL HIGH (ref 0.61–1.24)
GFR, Estimated: >60 mL/min (ref >60)
Glucose, Bld: 99 mg/dL (ref 70–99)
Potassium: 3.9 mmol/L (ref 3.5–5.1)
Sodium: 139 mmol/L (ref 135–145)

## 2022-03-13 LAB — CBC
HCT: 45.5 % (ref 39.0–52.0)
Hemoglobin: 15.5 g/dL (ref 13.0–17.0)
MCH: 31.7 pg (ref 26.0–34.0)
MCHC: 34.1 g/dL (ref 30.0–36.0)
MCV: 93 fL (ref 80.0–100.0)
Platelets: 250 10*3/uL (ref 150–400)
RBC: 4.89 MIL/uL (ref 4.22–5.81)
RDW: 12.9 % (ref 11.5–15.5)
WBC: 10.5 10*3/uL (ref 4.0–10.5)
nRBC: 0 % (ref 0.0–0.2)

## 2022-03-13 LAB — TROPONIN I (HIGH SENSITIVITY)
Troponin I (High Sensitivity): 12 ng/L (ref <18)
Troponin I (High Sensitivity): 13 ng/L (ref <18)

## 2022-03-13 MED ORDER — NITROGLYCERIN 0.4 MG SL SUBL
0.4000 mg | SUBLINGUAL_TABLET | SUBLINGUAL | Status: DC | PRN
  Administered 2022-03-13: 0.4 mg via SUBLINGUAL
  Filled 2022-03-13: qty 1

## 2022-03-13 MED ORDER — ASPIRIN 81 MG PO CHEW
324.0000 mg | CHEWABLE_TABLET | Freq: Once | ORAL | Status: AC
  Administered 2022-03-13: 324 mg via ORAL
  Filled 2022-03-13: qty 4

## 2022-03-13 NOTE — Telephone Encounter (Signed)
Pt states that he had chest pain mid-chest and pectoral muscles yesterday. Lasted all day, later in the evening he took 3-'81mg'$  aspirin and the pain went away and he was able to sleep all night and the morning no pain. He states that at 12:30 after eating it returned. He states that he does not thing that it indigestion because he does have indigestion too, but this is different that indigestion. He states that for lunch he had the same salad with grilled chicken that he has. He also states that he is urinating at least once an hour. He also notes that he feels weak "in his voice" and this is "getting weaker and weaker". He states that he will go to the ER and get evaluated. I will forward to Dr Martinique.

## 2022-03-13 NOTE — ED Provider Notes (Incomplete)
[facility] EMERGENCY DEPT Provider Note   CSN: [id number] Arrival date & time: 03/13/22  1558     History {Add pertinent medical, surgical, social history, OB history to HPI:1} Chief Complaint  Patient presents with   Chest Pain    Keith Franklin is a 61 y.o. male.   Chest Pain  Patient is a 61 year old male with past medical history significant for CAD CABG 1 year ago three-vessel after STEMI, reflux, kidney stones, obesity, hyperlipidemia  Patient is presenting with chest pain that has been been present since today around noon.  He states it is a pressure that is sternal constant nonradiating does not seem to be associated with any dyspnea, nausea, vomiting, lightheadedness.  It is nonradiating does not seem to be exertional or pleuritic.  He has not noticed any lower extremity swelling.  He states that he did have an episode of chest pain that began yesterday and resolved at 8 PM after he took a full dose of aspirin.  He states the pain is 6/10.  He does not endorse any particular aggravating or mitigating factors.  He did however have some improvement in his pain which is now 5/10 after a dose of nitroglycerin  No fevers, cough, hemoptysis  He does mention that he has been peeing more often than usual.    Home Medications Prior to Admission medications   Medication Sig Start Date End Date Taking? Authorizing Provider  acetaminophen (TYLENOL) 500 MG tablet Take 1,000 mg by mouth every 6 (six) hours as needed for moderate pain.    [provider]  aspirin EC 81 MG EC tablet Take 1 tablet (81 mg total) by mouth daily. Swallow whole. 10/07/20   Gold, Patrick Jupiter E, PA-C  atorvastatin (LIPITOR) 40 MG tablet TAKE 1 TABLET BY MOUTH EVERY DAY 07/24/21   Martinique, Peter M, MD  metoprolol succinate (TOPROL XL) 25 MG 24 hr tablet Take 1 tablet (25 mg total) by mouth daily. 08/22/21   Martinique, Peter M, MD  tamsulosin (FLOMAX) 0.4 MG CAPS capsule Take 0.4 mg by mouth daily.  01/10/21   [provider]      Allergies    Patient has no known allergies.    Review of Systems   Review of Systems  Cardiovascular:  Positive for chest pain.    Physical Exam Updated Vital Signs BP 139/85   Pulse (!) 55   Temp 98.8 F (37.1 C) (Oral)   Resp 17   Ht '5\' 8"'$  (1.727 m)   Wt 111.4 kg   SpO2 97%   BMI 37.34 kg/m  Physical Exam Vitals and nursing note reviewed.  Constitutional:      General: He is not in acute distress. HENT:     Head: Normocephalic and atraumatic.     Nose: Nose normal.     Mouth/Throat:     Mouth: Mucous membranes are moist.  Eyes:     General: No scleral icterus. Neck:     Comments: No JVD Cardiovascular:     Rate and Rhythm: Normal rate and regular rhythm.     Pulses: Normal pulses.     Heart sounds: Normal heart sounds.  Pulmonary:     Effort: Pulmonary effort is normal. No respiratory distress.     Breath sounds: Normal breath sounds. No wheezing.  Abdominal:     Palpations: Abdomen is soft.     Tenderness: There is no abdominal tenderness. There is no guarding or rebound.  Musculoskeletal:     Cervical back:  Normal range of motion.     Right lower leg: No edema.     Left lower leg: No edema.     Comments: No lower extremity swelling.  No calf tenderness  Skin:    General: Skin is warm and dry.     Capillary Refill: Capillary refill takes less than 2 seconds.  Neurological:     Mental Status: He is alert. Mental status is at baseline.  Psychiatric:        Mood and Affect: Mood normal.        Behavior: Behavior normal.     ED Results / Procedures / Treatments   Labs (all labs ordered are listed, but only abnormal results are displayed) Labs Reviewed  BASIC METABOLIC PANEL - Abnormal; Notable for the following components:      Result Value   Creatinine, Ser 1.26 (*)    All other components within normal limits  CBC  URINALYSIS, ROUTINE W REFLEX MICROSCOPIC  TROPONIN I (HIGH SENSITIVITY)  TROPONIN I (HIGH  SENSITIVITY)    EKG EKG Interpretation  Date/Time:  Tuesday March 13 2022 16:06:48 EST Ventricular Rate:  61 PR Interval:  172 QRS Duration: 88 QT Interval:  416 QTC Calculation: 418 R Axis:   -31 Text Interpretation: Normal sinus rhythm Left axis deviation Cannot rule out Anterior infarct (cited on or before 03-Oct-2020) Abnormal ECG When compared with ECG of 04-Oct-2020 06:54, No significant change was found Confirmed by Garnette Gunner [phone]) on 03/13/2022 5:56:27 PM  Radiology DG Chest 2 View  Result Date: 03/13/2022 CLINICAL DATA:  Chest pain EXAM: CHEST - 2 VIEW COMPARISON:  12/06/2020 FINDINGS: Borderline enlargement of the cardiopericardial silhouette. Prior CABG. The lungs appear clear. No blunting of the costophrenic angles. No significant bony findings. IMPRESSION: 1. Borderline enlargement of the cardiopericardial silhouette, without edema. 2. Prior CABG. Electronically Signed   By: Van Clines M.D.   On: 03/13/2022 17:27    Procedures Procedures  {Document cardiac monitor, telemetry assessment procedure when appropriate:1}  Medications Ordered in ED Medications  nitroGLYCERIN (NITROSTAT) SL tablet 0.4 mg (0.4 mg Sublingual Given 03/13/22 1835)  aspirin chewable tablet 324 mg (324 mg Oral Given 03/13/22 1834)    ED Course/ Medical Decision Making/ A&P Clinical Course as of 03/13/22 1932  Tue Mar 13, 2022  1807 CP yesterday noon-8pm and today around noon.   States he took full dose aspirin yesterday which helped his discomfort.  [WF]    Clinical Course User Index [WF] Tedd Sias, [place]                           Medical Decision Making Amount and/or Complexity of Data Reviewed Labs: ordered. Radiology: ordered.  Risk OTC drugs. Prescription drug management.   This patient presents to the ED for concern of chest pain, this involves a number of treatment options, and is a complaint that carries with it a moderate to high risk of  complications and morbidity. A differential diagnosis was considered for the patient's symptoms which is discussed below:   The emergent causes of chest pain include: Acute coronary syndrome, tamponade, pericarditis/myocarditis, aortic dissection, pulmonary embolism, tension pneumothorax, pneumonia, and esophageal rupture.   Co morbidities: Discussed in HPI   Brief History:  Patient is a 61 year old male with past medical history significant for CAD CABG 1 year ago three-vessel after STEMI, reflux, kidney stones, obesity, hyperlipidemia  Patient is presenting with chest pain that has been been present since  today around noon.  He states it is a pressure that is sternal constant nonradiating does not seem to be associated with any dyspnea, nausea, vomiting, lightheadedness.  It is nonradiating does not seem to be exertional or pleuritic.  He has not noticed any lower extremity swelling.  He states that he did have an episode of chest pain that began yesterday and resolved at 8 PM after he took a full dose of aspirin.  He states the pain is 6/10.  He does not endorse any particular aggravating or mitigating factors.  He did however have some improvement in his pain which is now 5/10 after a dose of nitroglycerin  No fevers, cough, hemoptysis  He does mention that he has been peeing more often than usual.    EMR reviewed including pt PMHx, past surgical history and past visits to ER.   See HPI for more details   Lab Tests:   I personally reviewed all laboratory work and imaging. Metabolic panel without any acute abnormality specifically kidney function within normal limits and no significant electrolyte abnormalities. CBC without leukocytosis or significant anemia. Trop x2 WNLs and no sign delta  Imaging Studies:  NAD. I personally reviewed all imaging studies and no acute abnormality found. I agree with radiology interpretation.    Cardiac Monitoring:  The patient was  maintained on a cardiac monitor.  I personally viewed and interpreted the cardiac monitored which showed an underlying rhythm of: NSR EKG non-ischemic continues to have T wave inversions in aVL similar to prior EKGs.   Medicines ordered:  I ordered medication including ***  for *** Reevaluation of the patient after these medicines showed that the patient {resolved/improved/worsened:23923::"improved"} I have reviewed the patients home medicines and have made adjustments as needed   Critical Interventions:  ***   Consults/Attending Physician   {Blank single:19197::"I requested consultation with ***,  and discussed lab and imaging findings as well as pertinent plan - they recommend: ***","I discussed this case with my attending physician who cosigned this note including patient's presenting symptoms, physical exam, and planned diagnostics and interventions. Attending physician stated agreement with plan or made changes to plan which were implemented."}   Reevaluation:  After the interventions noted above I re-evaluated patient and found that they have :{resolved/improved/worsened:23923::"improved"}   Social Determinants of Health:  {Blank single:19197::"Given cab voucher","Social work/case management involved","The patient's social determinants of health were a factor in the care of this patient"}    Problem List / ED Course:  ***   Dispostion:  After consideration of the diagnostic results and the patients response to treatment, I feel that the patent would benefit from ***       Final Clinical Impression(s) / ED Diagnoses Final diagnoses:  None    Rx / DC Orders ED Discharge Orders     None

## 2022-03-13 NOTE — ED Notes (Addendum)
Pt stated that the nitro decreased his pain from a 6 down to a 5. Pt doesn't want another nitro because he states that he now has a headache.

## 2022-03-13 NOTE — Discharge Instructions (Addendum)
I discussed with our cardiologist who agrees with our plan to have you follow-up with your primary cardiologist.  Please return to the ER for any new or concerning symptoms.  Continue take your medications as prescribed.  You do have a tiny amount of blood in your urine.  Please follow-up with your primary care doctor to have this rechecked.

## 2022-03-13 NOTE — Telephone Encounter (Signed)
  Per MyChart scheduling message:   Attachment has also been forwarded to Triage that was sent via MyChart message  Pt c/o of Chest Pain: STAT if CP now or developed within 24 hours  1. Are you having CP right now?   2. Are you experiencing any other symptoms (ex. SOB, nausea, vomiting, sweating)?   3. How long have you been experiencing CP?   4. Is your CP continuous or coming and going?   5. Have you taken Nitroglycerin?    Chest Pain is difficult to describe. Seems like muscular tension in the pectoral muscles / slight internal chest pressure near the sternum.   Not in distress but this doesn't feel normal   Voice seems strained at times.    Pain started 24 hrs ago lasting about 8 hrs. Went away at bedtime after daily meds plus additional '242mg'$  asprin.    No shortness of breath  No nausea Able to workout (treadmill 20 min, strength training on machines, 15 min on recumbent cross trainer)   Heart rate seems normal 52 bpm at rest. ~75 bpm while walking. 120-130 bpm while working out.  Some periods of irregular heart beat which Dr Neita Garnet is aware.   ECG on Apple Watch shows sinus rhythm (no issues)    Blood oxygen 97-98%   No new meds   No nitro  Felt fine from 10pm last night until12:30pm today when same pain/pressure started again. ?

## 2022-03-13 NOTE — ED Triage Notes (Signed)
Patient here POV from Home.  Endorses CP since this PM at 1230. Had an Episode of Pain Yesterday PM. At Rest when Pain began today and has continued since. Mainly Mid Chest and Pressure Like.   No SOB. Some Headache.   NAD Noted during Triage. A&Ox4. GCS 15. Ambulatory.

## 2022-03-13 NOTE — Telephone Encounter (Signed)
Telephone encounter:  Keith Franklin is a 61 yo M with pmhx CABG x3 (LIMA-LAD, SVG to first diagonal and third obtuse marginal) in 09/2020 and HFrEF EF 40-45%. He presented to [facility] ER with mid-chest pain present at stress and at rest that started at 12:30PM. Received call at 8:30PM that patient's chest pain had resolved. EKG with NSR and no ST elevations, troponins flat (12 -> 13). CBC/CMP wnl except Cr 1.05 -> 1.26.   ER physician called to ask if patient could have earlier cardiology follow up. He was just seen by his cardiologist Dr. Martinique on 02/22/22 at which time he was noted to have excellent recovery from STEMI in 2022 and HTN was well controlled.   Overalll, in the setting of atypical chest pain and flat troponins, suspect that his chest pain is non-cardiac. However, will route this message to Dr. Martinique to determine if patient needs to be seen in cardiologic clinic earlier than 6 month f/u.  Loel Dubonnet MD MPH [facility] Cardiology

## 2022-03-15 NOTE — Telephone Encounter (Signed)
Pt went to the ED for eval, Atypical chest pain 2 neg trop.

## 2022-03-22 ENCOUNTER — Other Ambulatory Visit: Payer: Self-pay | Admitting: Cardiology

## 2022-04-05 ENCOUNTER — Other Ambulatory Visit (HOSPITAL_BASED_OUTPATIENT_CLINIC_OR_DEPARTMENT_OTHER): Payer: Self-pay

## 2022-04-05 MED ORDER — ZOSTER VAC RECOMB ADJUVANTED 50 MCG/0.5ML IM SUSR
INTRAMUSCULAR | 1 refills | Status: DC
  Filled 2022-04-05: qty 0.5, 1d supply, fill #0
  Filled 2022-06-19: qty 0.5, 1d supply, fill #1

## 2022-04-05 NOTE — Telephone Encounter (Signed)
Appointment scheduled with Diona Browner NP 12/14 at 8:50 am.

## 2022-04-12 ENCOUNTER — Ambulatory Visit: Payer: PRIVATE HEALTH INSURANCE | Attending: Nurse Practitioner | Admitting: Nurse Practitioner

## 2022-04-12 ENCOUNTER — Encounter: Payer: Self-pay | Admitting: Nurse Practitioner

## 2022-04-12 ENCOUNTER — Other Ambulatory Visit (HOSPITAL_BASED_OUTPATIENT_CLINIC_OR_DEPARTMENT_OTHER): Payer: Self-pay

## 2022-04-12 VITALS — BP 128/75 | HR 71 | Ht 68.0 in | Wt 253.0 lb

## 2022-04-12 DIAGNOSIS — E785 Hyperlipidemia, unspecified: Secondary | ICD-10-CM

## 2022-04-12 DIAGNOSIS — I77819 Aortic ectasia, unspecified site: Secondary | ICD-10-CM

## 2022-04-12 DIAGNOSIS — I2510 Atherosclerotic heart disease of native coronary artery without angina pectoris: Secondary | ICD-10-CM

## 2022-04-12 DIAGNOSIS — I255 Ischemic cardiomyopathy: Secondary | ICD-10-CM

## 2022-04-12 DIAGNOSIS — I10 Essential (primary) hypertension: Secondary | ICD-10-CM

## 2022-04-12 DIAGNOSIS — G4733 Obstructive sleep apnea (adult) (pediatric): Secondary | ICD-10-CM

## 2022-04-12 DIAGNOSIS — E669 Obesity, unspecified: Secondary | ICD-10-CM

## 2022-04-12 NOTE — Patient Instructions (Signed)
Medication Instructions:  Your physician recommends that you continue on your current medications as directed. Please refer to the Current Medication list given to you today.   *If you need a refill on your cardiac medications before your next appointment, please call your pharmacy*   Lab Work: NONE ordered at this time of appointment   If you have labs (blood work) drawn today and your tests are completely normal, you will receive your results only by: [place] (if you have MyChart) OR A paper copy in the mail If you have any lab test that is abnormal or we need to change your treatment, we will call you to review the results.   Testing/Procedures: Your physician has requested that you have an echocardiogram. Echocardiography is a painless test that uses sound waves to create images of your heart. It provides your doctor with information about the size and shape of your heart and how well your heart's chambers and valves are working. This procedure takes approximately one hour. There are no restrictions for this procedure. Please do NOT wear cologne, perfume, aftershave, or lotions (deodorant is allowed). Please arrive 15 minutes prior to your appointment time.    Follow-Up: At [facility], you and your health needs are our priority.  As part of our continuing mission to provide you with exceptional heart care, we have created designated Provider Care Teams.  These Care Teams include your primary Cardiologist (physician) and Advanced Practice Providers (APPs -  Physician Assistants and Nurse Practitioners) who all work together to provide you with the care you need, when you need it.  We recommend signing up for the patient portal called "MyChart".  Sign up information is provided on this After Visit Summary.  MyChart is used to connect with patients for Virtual Visits (Telemedicine).  Patients are able to view lab/test results, encounter notes, upcoming appointments, etc.   Non-urgent messages can be sent to your provider as well.   To learn more about what you can do with MyChart, go to [email or web address].    Your next appointment:   3-5 month(s)  The format for your next appointment:   In Person  Provider:   Peter Martinique, MD  or Diona Browner, NP        Other Instructions   Important Information About Sugar

## 2022-04-12 NOTE — Progress Notes (Signed)
Office Visit    Patient Name: Keith Franklin Date of Encounter: 04/12/2022  Primary Care Provider:  Allwardt, Randa Evens, PA-C Primary Cardiologist:  Peter Martinique, MD  Chief Complaint    61 year old male with a history of CAD s/p CABG x 3 (LIMA-LAD, SVG-D1 and SVG-OM) in 09/2020, PEA/cardiac arrest at the time of surgery, hypertension, hyperlipidemia, OSA, and GERD who presents for follow-up related to CAD.  Past Medical History    Past Medical History:  Diagnosis Date   Cancer ([place]) 2010   skin cancer, Basal Cell Carcinoma    COVID-19 07/2019   GERD (gastroesophageal reflux disease)    History of kidney stones    Myocardial infarction ([place]) 09/30/2020   Past Surgical History:  Procedure Laterality Date   CORONARY ARTERY BYPASS GRAFT N/A 10/03/2020   Procedure: CORONARY ARTERY BYPASS GRAFTING (CABG) X THREE, ON PUMP, USING LEFT INTERNAL MAMMARY ARTERY AND RIGHT GREATER SAPHENOUS ENDOSCOPIC VEIN HARVEST CONDUITS;  Surgeon: Melrose Nakayama, MD;  Location: [place];  Service: Open Heart Surgery;  Laterality: N/A;  will harvest left radial artery for conduit [place]   CORONARY/GRAFT ACUTE MI REVASCULARIZATION N/A 09/30/2020   Procedure: Coronary/Graft Acute MI Revascularization;  Surgeon: Martinique, Peter M, MD;  Location: [place] [facility];  Service: Cardiovascular;  Laterality: N/A;   CYSTOSCOPY WITH URETHRAL DILATATION N/A 04/10/2021   Procedure: CYSTOSCOPY WITH OPTILUME URETHRAL DILATATION;  Surgeon: Lucas Mallow, MD;  Location: [facility];  Service: Urology;  Laterality: N/A;   LEFT HEART CATH AND CORONARY ANGIOGRAPHY N/A 09/30/2020   Procedure: LEFT HEART CATH AND CORONARY ANGIOGRAPHY;  Surgeon: Martinique, Peter M, MD;  Location: [place] [facility];  Service: Cardiovascular;  Laterality: N/A;   skin cancer removal     TONSILLECTOMY     WISDOM TOOTH EXTRACTION      Allergies  No Known Allergies  History of Present Illness    61 year old male with the above past medical  history including CAD s/p CABG x 3 (LIMA-LAD, SVG-D1 and SVG-OM) in 09/2020, PEA/cardiac arrest at the time of surgery, hypertension, hyperlipidemia, OSA, and GERD.  He was hospitalized in June 2022 in the setting of NSTEMI.  Emergent cardiac catheterization revealed multivessel CAD.  He underwent CABG x 3 on 10/03/2020.  While his lines were being placed and he was given sedation he had a bradycardic event with PEA and cardiac arrest with ROSC following less than 2 minutes of CPR.  Echocardiogram in October 2022 showed EF 40 to 45% mildly decreased LV function, LV global hypokinesis, G2 DD, mildly reduced RV systolic function, moderately enlarged RV, moderately dilated RA, no significant valvular abnormalities, mild dilation of ascending aorta, 41 mm.  He has a history of palpitations, managed on metoprolol.  He was last seen in the office on 02/22/2022 and was stable from a cardiac standpoint.  He denied symptoms concerning for angina.  He presented to the ED on 03/13/2022 with nonexertional chest pressure.  Troponin was negative x 2, EKG was unchanged from prior (T wave inversion in aVL), chest x-ray was unremarkable.  His symptoms resolved and he was discharged home in stable condition and advised to follow-up with cardiology as an outpatient.  He presents today for follow-up.  Since his last visit he has been stable from a cardiac standpoint.  He denies any further chest discomfort his ED visit.  He states he has been under a lot of stress at work and is in the process of retiring in the next few  months. He remains active and exercises 4 to 5 days a week with cardio and strength training. He denies any exertional symptoms concerning for angina. He has noticed a slight increase in his weight over the past several months despite style modifications with diet and exercise.  He notes mild nonpitting bilateral lower ankle edema, he denies dyspnea, PND, orthopnea. Overall, he reports feeling well.  Home  Medications    Current Outpatient Medications  Medication Sig Dispense Refill   acetaminophen (TYLENOL) 500 MG tablet Take 1,000 mg by mouth every 6 (six) hours as needed for moderate pain.     aspirin EC 81 MG EC tablet Take 1 tablet (81 mg total) by mouth daily. Swallow whole.     atorvastatin (LIPITOR) 40 MG tablet TAKE 1 TABLET BY MOUTH EVERY DAY 90 tablet 2   metoprolol succinate (TOPROL-XL) 25 MG 24 hr tablet TAKE 1 TABLET (25 MG TOTAL) BY MOUTH DAILY. 90 tablet 3   tamsulosin (FLOMAX) 0.4 MG CAPS capsule Take 0.4 mg by mouth daily.     Zoster Vaccine Adjuvanted [facility]) injection Inject into the muscle. 0.5 mL 1   No current facility-administered medications for this visit.     Review of Systems    He denies chest pain, palpitations, dyspnea, pnd, orthopnea, n, v, dizziness, syncope, weight gain, or early satiety. All other systems reviewed and are otherwise negative except as noted above.   Physical Exam    VS:  BP 128/75   Pulse 71   Ht '5\' 8"'$  (1.727 m)   Wt 253 lb (114.8 kg)   SpO2 94%   BMI 38.47 kg/m  GEN: Well nourished, well developed, in no acute distress. HEENT: normal. Neck: Supple, no JVD, carotid bruits, or masses. Cardiac: RRR, no murmurs, rubs, or gallops. No clubbing, cyanosis, mild nonpitting ankle edema.  Radials/DP/PT 2+ and equal bilaterally.  Respiratory:  Respirations regular and unlabored, clear to auscultation bilaterally. GI: Soft, nontender, nondistended, BS + x 4. MS: no deformity or atrophy. Skin: warm and dry, no rash. Neuro:  Strength and sensation are intact. Psych: Normal affect.  Accessory Clinical Findings    ECG personally reviewed by me today - No EKG in office today.    Lab Results  Component Value Date   WBC 10.5 03/13/2022   HGB 15.5 03/13/2022   HCT 45.5 03/13/2022   MCV 93.0 03/13/2022   PLT 250 03/13/2022   Lab Results  Component Value Date   CREATININE 1.26 (H) 03/13/2022   BUN 18 03/13/2022   NA 139 03/13/2022    K 3.9 03/13/2022   CL 105 03/13/2022   CO2 24 03/13/2022   Lab Results  Component Value Date   ALT 16 01/18/2022   AST 16 01/18/2022   ALKPHOS 60 01/18/2022   BILITOT 0.8 01/18/2022   Lab Results  Component Value Date   CHOL 91 01/18/2022   HDL 41.40 01/18/2022   LDLCALC 42 01/18/2022   TRIG 37.0 01/18/2022   CHOLHDL 2 01/18/2022    Lab Results  Component Value Date   HGBA1C 5.4 07/18/2021    Assessment & Plan    1. CAD: S/p CABG x 3 in 09/2020.  Recent ED visit in 02/2022 in the setting of nonexertional chest pressure.  Troponin was negative x 2, EKG was stable.  He denies any further symptoms concerning for angina.  He is active, denies exertional symptoms.  Offered prescription for nitroglycerin, patient declined. Discussed ED precautions.  Continue aspirin, metoprolol, Lipitor.  2. ICM:  EF was 40 to 45% in 01/2021.  He has noted a slight increase in weight despite lifestyle modifications with diet and exercise, nonpitting bilateral ankle edema, overall euvolemic and well compensated on exam.  Will repeat echo.  Continue metoprolol.  3. Hypertension: BP well controlled. Continue current antihypertensive regimen.   4. Hyperlipidemia: LDL was 42 in 12/2021.  Continue aspirin, Lipitor.  5. Mild dilation of ascending aorta: Measured 41 mm on echocardiogram in October 2022.  Repeat echo pending as above.  6. OSA: Adherent to CPAP.  7. Obesity: He has noticed weight gain despite lifestyle modifications with diet and exercise.  He may be a good candidate for GLP-1 agonist.  Urged him to discuss this with his PCP.  8. Disposition: Follow-up in 3-5 months with Dr. Martinique.      Lenna Sciara, NP 04/12/2022, 9:58 AM

## 2022-04-16 ENCOUNTER — Ambulatory Visit (HOSPITAL_COMMUNITY): Payer: PRIVATE HEALTH INSURANCE | Attending: Nurse Practitioner

## 2022-04-16 DIAGNOSIS — I77819 Aortic ectasia, unspecified site: Secondary | ICD-10-CM | POA: Insufficient documentation

## 2022-04-16 DIAGNOSIS — I255 Ischemic cardiomyopathy: Secondary | ICD-10-CM

## 2022-04-16 LAB — ECHOCARDIOGRAM COMPLETE
Area-P 1/2: 3.21 cm2
MV M vel: 5.32 m/s
MV Peak grad: 113.2 mmHg
S' Lateral: 3.8 cm

## 2022-04-20 ENCOUNTER — Telehealth: Payer: Self-pay

## 2022-04-20 NOTE — Telephone Encounter (Signed)
Lmom to discuss echo results. Waiting on a return call. 

## 2022-04-25 ENCOUNTER — Telehealth: Payer: Self-pay | Admitting: Nurse Practitioner

## 2022-04-25 NOTE — Telephone Encounter (Signed)
Patient is returning CMA's call for echo results. Please advise.

## 2022-04-25 NOTE — Telephone Encounter (Signed)
Lenna Sciara, NP 04/17/2022  4:42 PM EST     Most recent echocardiogram overall unchanged from prior, heart pumping function remains mildly reduced, the left bottom chamber of the heart is mildly thickened, mitral valve is mildly leaky.  Overall stable.  Continue current medications and follow-up as planned.  Thank you.-EM    Attempt to return call-line busy.

## 2022-04-26 NOTE — Telephone Encounter (Signed)
Attempted to call patient- line busy (same yesterday 04/25/22) Called patient's spouse (DPR) and left message to call back- left number to call.

## 2022-04-27 ENCOUNTER — Other Ambulatory Visit: Payer: Self-pay | Admitting: Cardiology

## 2022-04-27 NOTE — Telephone Encounter (Signed)
Attempted call to patient, line busy. Also sent a MyChart message with results of Echocardiogram

## 2022-06-19 ENCOUNTER — Other Ambulatory Visit (HOSPITAL_BASED_OUTPATIENT_CLINIC_OR_DEPARTMENT_OTHER): Payer: Self-pay

## 2022-07-02 ENCOUNTER — Telehealth: Payer: Self-pay | Admitting: Family Medicine

## 2022-07-02 NOTE — Telephone Encounter (Signed)
Appt reminder Mychart

## 2022-07-05 NOTE — Progress Notes (Deleted)
PATIENT: Keith Franklin DOB: [birth year]  REASON FOR VISIT: follow up HISTORY FROM: patient  Virtual Visit via Telephone Note  I connected with Keith Franklin on 07/05/22 at  8:30 AM EDT by telephone and verified that I am speaking with the correct person using two identifiers.   I discussed the limitations, risks, security and privacy concerns of performing an evaluation and management service by telephone and the availability of in person appointments. I also discussed with the patient that there may be a patient responsible charge related to this service. The patient expressed understanding and agreed to proceed.   History of Present Illness:  07/05/22 ALL ([place]): Keith Franklin is a 62 y.o. male here today for follow up for OSA on CPAP. He was last seen 06/2021 by Dr Rexene Alberts.    History (copied from Dr Guadelupe Sabin previous note)  Keith Franklin is a 62 year old right-handed gentleman with an underlying medical history of MI, bradycardia, status post three-vessel CABG in June 2022, nephrolithiasis, history of COVID, skin cancer, reflux disease, and obesity, who presents for follow-up consultation of his obstructive sleep apnea after interim home sleep testing and starting AutoPap therapy.  The patient is unaccompanied today.  I first met him at the request of his primary care provider on 03/28/2021, at which time he reported snoring and witnessed apneas as well as tiredness and difficulty maintaining sleep.  He was advised to proceed with a sleep study.  He had a home sleep test on 04/05/2021 which indicated moderate obstructive sleep apnea with a total AHI of 16.4/hour and O2 nadir of 86%.  Intermittent mild to moderate snoring was detected.   He was advised to proceed with AutoPap therapy.  His set up date was 04/19/21.  He has a Personnel officer.   Today, 07/06/2021: I reviewed his AutoPap compliance data from 06/05/2021 through 07/04/2021, which is a total of 30 days, during which time he used his  machine 29 days with percent use days greater than 4 hours at 96.7%, indicating excellent compliance with an average usage of 7 hours and 59 minutes, residual AHI at goal at 0.4/h, average leak at 6.2 L/min which is acceptable, average pressure for the 95th percentile at 6.6 cm, range of 5 to 11 cm.  He reports generally doing well with his AutoPap.  He feels that it is helpful but he still has sleep disruption and wakes up in the middle of the night 2 to 3 times.  Of note, he has had some general muscular discomfort, attributes this to taking a statin.  He was able to reduce his statin due to low LDL.  He is currently at goal with his cholesterol numbers.  He follows with cardiology on a regular basis, still has discomfort in his sternum and takes tramadol typically once daily in the evening around 9 PM.  According to his sleep app he has lower respiratory rate in the early morning hours, as low as 10/min.  He is not particularly worried about it but noticed it.  He denies any shortness of breath, palpitations, chest pain or difficulty breathing.  He is very motivated to continue with his AutoPap.  He is exercising on a regular basis.  Prior to his heart attack and open heart surgery he was able to take Advil PM which helped him sleep through the night.  After his heart attack and since he is on Plavix, he has tried Tylenol PM which does not help.  Melatonin did not help as much either.  Observations/Objective:  Generalized: Well developed, in no acute distress  Mentation: Alert oriented to time, place, history taking. Follows all commands speech and language fluent   Assessment and Plan:  62 y.o. year old male  has a past medical history of Cancer (Keith Franklin) (2010), COVID-19 (07/2019), GERD (gastroesophageal reflux disease), History of kidney stones, and Myocardial infarction (Keith Franklin) (09/30/2020). here with  No diagnosis found.  [name] continues to do well on CPAP therapy. Compliance report shows ***  compliance. He was encouraged to continue CPAP nightly for at least 4 hours. Supply orders updated. Educational material provided. Healthy lifestyle habits encouraged. He will follow up in 1 year, sooner if needed.   No orders of the defined types were placed in this encounter.   No orders of the defined types were placed in this encounter.    Follow Up Instructions:  I discussed the assessment and treatment plan with the patient. The patient was provided an opportunity to ask questions and all were answered. The patient agreed with the plan and demonstrated an understanding of the instructions.   The patient was advised to call back or seek an in-person evaluation if the symptoms worsen or if the condition fails to improve as anticipated.  I provided *** minutes of non-face-to-face time during this encounter. Patient located at their place of residence during [place] visit. Provider is in the office.    Debbora Presto, NP

## 2022-07-09 ENCOUNTER — Encounter: Payer: Self-pay | Admitting: *Deleted

## 2022-07-10 ENCOUNTER — Telehealth: Payer: PRIVATE HEALTH INSURANCE | Admitting: Family Medicine

## 2022-07-10 DIAGNOSIS — G4733 Obstructive sleep apnea (adult) (pediatric): Secondary | ICD-10-CM

## 2022-07-10 NOTE — Progress Notes (Deleted)
Pt sent ESS questionnaire via my chart   Sitting and reading: 1 Watching TV: 2 Sitting inactive in a public place (ex. Theater or meeting):0 As a passenger in a car for an hour without a break:1 Lying down to rest in the afternoon: 3 Sitting and talking to someone: 1 Sitting quietly after lunch (no alcohol): 3 In a car, while stopped in traffic: 0 Total: 11

## 2022-07-19 ENCOUNTER — Encounter: Payer: PRIVATE HEALTH INSURANCE | Admitting: Physician Assistant

## 2022-07-23 ENCOUNTER — Ambulatory Visit: Payer: PRIVATE HEALTH INSURANCE | Admitting: Nurse Practitioner

## 2022-08-03 ENCOUNTER — Ambulatory Visit: Payer: PRIVATE HEALTH INSURANCE | Admitting: Cardiology

## 2022-08-20 ENCOUNTER — Other Ambulatory Visit: Payer: Self-pay

## 2022-08-20 ENCOUNTER — Ambulatory Visit: Payer: PRIVATE HEALTH INSURANCE | Attending: Nurse Practitioner | Admitting: Nurse Practitioner

## 2022-08-20 ENCOUNTER — Encounter: Payer: Self-pay | Admitting: Nurse Practitioner

## 2022-08-20 VITALS — BP 124/84 | HR 70 | Ht 68.0 in | Wt 257.4 lb

## 2022-08-20 DIAGNOSIS — I255 Ischemic cardiomyopathy: Secondary | ICD-10-CM

## 2022-08-20 DIAGNOSIS — I2510 Atherosclerotic heart disease of native coronary artery without angina pectoris: Secondary | ICD-10-CM

## 2022-08-20 DIAGNOSIS — G4733 Obstructive sleep apnea (adult) (pediatric): Secondary | ICD-10-CM

## 2022-08-20 DIAGNOSIS — E785 Hyperlipidemia, unspecified: Secondary | ICD-10-CM | POA: Diagnosis not present

## 2022-08-20 DIAGNOSIS — I10 Essential (primary) hypertension: Secondary | ICD-10-CM

## 2022-08-20 DIAGNOSIS — E669 Obesity, unspecified: Secondary | ICD-10-CM

## 2022-08-20 DIAGNOSIS — I77819 Aortic ectasia, unspecified site: Secondary | ICD-10-CM

## 2022-08-20 MED ORDER — ROSUVASTATIN CALCIUM 20 MG PO TABS: 20.0000 mg | ORAL_TABLET | Freq: Every day | ORAL | 3 refills | Status: DC

## 2022-08-20 MED ORDER — METOPROLOL SUCCINATE ER 25 MG PO TB24: 25.0000 mg | ORAL_TABLET | Freq: Every day | ORAL | 0 refills | Status: DC

## 2022-08-20 NOTE — Progress Notes (Signed)
Office Visit    Patient Name: Keith Franklin Date of Encounter: 08/20/2022  Primary Care Provider:  Allwardt, Crist Infante, PA-C Primary Cardiologist:  Peter Swaziland, MD  Chief Complaint    62 year old male with a history of CAD s/p CABG x 3 (LIMA-LAD, SVG-D1 and SVG-OM) in 09/2020, PEA/cardiac arrest at the time of surgery, hypertension, hyperlipidemia, OSA, and GERD who presents for follow-up related to CAD.   Past Medical History    Past Medical History:  Diagnosis Date   Cancer 2010   skin cancer, Basal Cell Carcinoma    COVID-19 07/2019   GERD (gastroesophageal reflux disease)    History of kidney stones    Myocardial infarction 09/30/2020   Past Surgical History:  Procedure Laterality Date   CORONARY ARTERY BYPASS GRAFT N/A 10/03/2020   Procedure: CORONARY ARTERY BYPASS GRAFTING (CABG) X THREE, ON PUMP, USING LEFT INTERNAL MAMMARY ARTERY AND RIGHT GREATER SAPHENOUS ENDOSCOPIC VEIN HARVEST CONDUITS;  Surgeon: Loreli Slot, MD;  Location: [facility] OR;  Service: Open Heart Surgery;  Laterality: N/A;  will harvest left radial artery for conduit Swan   CORONARY/GRAFT ACUTE MI REVASCULARIZATION N/A 09/30/2020   Procedure: Coronary/Graft Acute MI Revascularization;  Surgeon: Swaziland, Peter M, MD;  Location: [facility] INVASIVE CV LAB;  Service: Cardiovascular;  Laterality: N/A;   CYSTOSCOPY WITH URETHRAL DILATATION N/A 04/10/2021   Procedure: CYSTOSCOPY WITH OPTILUME URETHRAL DILATATION;  Surgeon: Crista Elliot, MD;  Location: [facility] ORS;  Service: Urology;  Laterality: N/A;   LEFT HEART CATH AND CORONARY ANGIOGRAPHY N/A 09/30/2020   Procedure: LEFT HEART CATH AND CORONARY ANGIOGRAPHY;  Surgeon: Swaziland, Peter M, MD;  Location: [facility] INVASIVE CV LAB;  Service: Cardiovascular;  Laterality: N/A;   skin cancer removal     TONSILLECTOMY     WISDOM TOOTH EXTRACTION      Allergies  No Known Allergies   Labs/Other Studies Reviewed    The following studies were reviewed today: Echo  03/2022: IMPRESSIONS     1. Difficult apical windows. Endocardium is difficult to see well. The  mid/distal anterior distal lateral and apical walls appear hypokinetic  Consider limited echo with Definity to confirm wall motion and LVEF IN  comparison there does not appear to be  signifcant change in LVEF from echo in 2022. Left ventricular ejection  fraction, by estimation, is 40 to 45%. The left ventricle has mildly  decreased function. There is mild left ventricular hypertrophy. Left  ventricular diastolic parameters were normal.   2. Right ventricular systolic function is normal. The right ventricular  size is normal. There is normal pulmonary artery systolic pressure.   3. Left atrial size was mildly dilated.   4. Right atrial size was mildly dilated.   5. The mitral valve is normal in structure. Mild mitral valve  regurgitation.   6. The aortic valve is tricuspid. Aortic valve regurgitation is not  visualized. Aortic valve sclerosis is present, with no evidence of aortic  valve stenosis.   7. The inferior vena cava is normal in size with greater than 50%  respiratory variability, suggesting right atrial pressure of 3 mmHg.   Echo 01/2021: IMPRESSIONS    1. Left ventricular ejection fraction, by estimation, is 40 to 45%. The  left ventricle has mildly decreased function. The left ventricle  demonstrates global hypokinesis. Left ventricular diastolic parameters are  consistent with Grade II diastolic  dysfunction (pseudonormalization). The average left ventricular global  longitudinal strain is -11.6 %. The global longitudinal strain is  abnormal.  2. Right ventricular systolic function is mildly reduced. The right  ventricular size is moderately enlarged.   3. Left atrial size was mildly dilated.   4. Right atrial size was moderately dilated.   5. The mitral valve is normal in structure. Trivial mitral valve  regurgitation. No evidence of mitral stenosis.   6. The aortic  valve is normal in structure. Aortic valve regurgitation is  not visualized. No aortic stenosis is present.   7. Aortic dilatation noted. There is mild dilatation of the aortic root,  measuring 40 mm. There is mild dilatation of the ascending aorta,  measuring 41 mm.   Comparison(s): EF 45-50%, mild concentric LVH, moderate hypokinesis of LV,  mid-apical anterior wall, entire inferolateral wall. Ascending aorta 42mm.   LHC 09/2020: Prox LAD to Mid LAD lesion is 90% stenosed. Mid LAD lesion is 90% stenosed. 1st Diag lesion is 40% stenosed. Mid Cx to Dist Cx lesion is 100% stenosed. RV Branch lesion is 90% stenosed. 2nd RPL lesion is 70% stenosed. The left ventricular systolic function is normal. LV end diastolic pressure is normal. The left ventricular ejection fraction is 50-55% by visual estimate. Post intervention, there is a 40% residual stenosis. Balloon angioplasty was performed using a BALLOON SAPPHIRE 2.5X12.   1. Severe 2 vessel obstructive CAD.    There is severe diffuse segmental disease in the proximal to mid LAD 90%    100% occlusion of the LCx distally with thrombus 2. EF 50-55% with mid inferior HK 3. Normal LV EDP 4. Successful PCI of the distal LCx with POBA.   Plan: will resume IV heparin in 8 hours. Continue IV Aggrastat. Initiate beta blocker and high dose statin. Continue IV Ntg. Will consult CT surgery for CABG this admission given severe LAD disease. The LAD and OM 2 and 3 appear to be acceptable targets.    Recent Labs: 01/18/2022: ALT 16 03/13/2022: BUN 18; Creatinine, Ser 1.26; Hemoglobin 15.5; Platelets 250; Potassium 3.9; Sodium 139  Recent Lipid Panel    Component Value Date/Time   CHOL 91 01/18/2022 0912   TRIG 37.0 01/18/2022 0912   HDL 41.40 01/18/2022 0912   CHOLHDL 2 01/18/2022 0912   VLDL 7.4 01/18/2022 0912   LDLCALC 42 01/18/2022 0912   LDLCALC 32 01/13/2021 1516    History of Present Illness    62 year old male with the above past  medical history including CAD s/p CABG x 3 (LIMA-LAD, SVG-D1 and SVG-OM) in 09/2020, PEA/cardiac arrest at the time of surgery, hypertension, hyperlipidemia, OSA, and GERD.   He was hospitalized in June 2022 in the setting of NSTEMI.  Emergent cardiac catheterization revealed multivessel CAD.  He underwent CABG x 3 on 10/03/2020.  While his lines were being placed and he was given sedation he had a bradycardic event with PEA and cardiac arrest with ROSC following less than 2 minutes of CPR.  Echocardiogram in October 2022 showed EF 40 to 45% mildly decreased LV function, LV global hypokinesis, G2 DD, mildly reduced RV systolic function, moderately enlarged RV, moderately dilated RA, no significant valvular abnormalities, mild dilation of ascending aorta, 41 mm.  He has a history of palpitations, managed on metoprolol.  He presented to the ED on 03/13/2022 with nonexertional chest pressure.  Troponin was negative x 2, EKG was unchanged from prior (T wave inversion in aVL), chest x-ray was unremarkable.  His symptoms resolved and he was discharged home in stable condition and advised to follow-up with cardiology as an outpatient.  He was  last seen in the office on 04/12/2022 and was stable from a cardiac standpoint.  He denies any symptoms concerning for angina.  Repeat echocardiogram showed EF 40 to 45%, mildly decreased LV function, normal RV systolic function, mild mitral valve regurgitation, no evidence of aortic dilation.   He presents today for follow-up.  Since his last visit he has stable from a cardiac standpoint.  He denies any symptoms concerning for angina.  He has been dealing with plantar fasciitis and is following with orthopedics.  This has somewhat limited his physical activity though he continues to exercise regularly.  He also notes that since the time of his surgery he has had generalized muscle pain. Otherwise, he reports feeling well.   Home Medications    Current Outpatient Medications   Medication Sig Dispense Refill   acetaminophen (TYLENOL) 500 MG tablet Take 1,000 mg by mouth every 6 (six) hours as needed for moderate pain.     aspirin EC 81 MG EC tablet Take 1 tablet (81 mg total) by mouth daily. Swallow whole.     atorvastatin (LIPITOR) 40 MG tablet TAKE 1 TABLET BY MOUTH EVERY DAY 90 tablet 2   metoprolol succinate (TOPROL-XL) 25 MG 24 hr tablet TAKE 1 TABLET (25 MG TOTAL) BY MOUTH DAILY. 90 tablet 3   tamsulosin (FLOMAX) 0.4 MG CAPS capsule Take 0.4 mg by mouth daily.     Zoster Vaccine Adjuvanted [facility]) injection Inject into the muscle. 0.5 mL 1   No current facility-administered medications for this visit.     Review of Systems    He denies chest pain, palpitations, dyspnea, pnd, orthopnea, n, v, dizziness, syncope, edema, weight gain, or early satiety. All other systems reviewed and are otherwise negative except as noted above.   Physical Exam    VS:  BP 124/84   Pulse 70   Ht 5\' 8"  (1.727 m)   Wt 257 lb 6.4 oz (116.8 kg)   SpO2 96%   BMI 39.14 kg/m . GEN: Well nourished, well developed, in no acute distress. HEENT: normal. Neck: Supple, no JVD, carotid bruits, or masses. Cardiac: RRR, no murmurs, rubs, or gallops. No clubbing, cyanosis, edema.  Radials/DP/PT 2+ and equal bilaterally.  Respiratory:  Respirations regular and unlabored, clear to auscultation bilaterally. GI: Soft, nontender, nondistended, BS + x 4. MS: no deformity or atrophy. Skin: warm and dry, no rash. Neuro:  Strength and sensation are intact. Psych: Normal affect.  Accessory Clinical Findings    ECG personally reviewed by me today -no EKG in office today.   Lab Results  Component Value Date   WBC 10.5 03/13/2022   HGB 15.5 03/13/2022   HCT 45.5 03/13/2022   MCV 93.0 03/13/2022   PLT 250 03/13/2022   Lab Results  Component Value Date   CREATININE 1.26 (H) 03/13/2022   BUN 18 03/13/2022   NA 139 03/13/2022   K 3.9 03/13/2022   CL 105 03/13/2022   CO2 24  03/13/2022   Lab Results  Component Value Date   ALT 16 01/18/2022   AST 16 01/18/2022   ALKPHOS 60 01/18/2022   BILITOT 0.8 01/18/2022   Lab Results  Component Value Date   CHOL 91 01/18/2022   HDL 41.40 01/18/2022   LDLCALC 42 01/18/2022   TRIG 37.0 01/18/2022   CHOLHDL 2 01/18/2022    Lab Results  Component Value Date   HGBA1C 5.4 07/18/2021    Assessment & Plan   1. CAD: S/p CABG x 3 in 09/2020.  Stable with no anginal symptoms. No indication for ischemic evaluation. He is active, denies exertional symptoms. Continue aspirin, metoprolol, Crestor as below.   2. ICM: EF was 40 to 45% in 01/2021.  Repeat echo in 03/2022  showed EF 40 to 45%, mildly decreased LV function, normal RV systolic function, mild mitral valve regurgitation, no evidence of aortic dilation.Euvolemic and well compensated on exam.  Discussed possible addition of Entresto for cardiomyopathy, patient had significant hypotension with lisinopril.  I do not think his blood pressure will tolerate Entresto or any additional medication at this time.  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  Will provide a 1 month supply of metoprolol in the meantime for him to pay out-of-pocket until he can get his prescription refilled. Continue metoprolol.   3. Hypertension: BP well controlled, history of significant hypotension with ACEi. Continue current antihypertensive regimen.    4. Hyperlipidemia: LDL was 42 in 12/2021.  He notes generalized muscle pain since the time of his CABG.  Question statin myalgia.  Will stop Lipitor and start Crestor 20 mg daily.  If symptoms persist despite change in statin therapy, consider statin holiday.  If he notes improvement with statin holiday, would then recommend referral to lipid clinic Pharm.D. for consideration of PCSK9 inhibitor.  Will plan for fasting lipid panel, LFTs in 6  to 8 weeks.     5. Mild dilation of ascending aorta: Measured 41 mm on echocardiogram in October 2022.,  Not appreciated on most recent echo.  6. OSA: Adherent to CPAP.   7. Obesity: He has noticed weight gain despite lifestyle modifications with diet and exercise.  He may be a good candidate for GLP-1 agonist.  Urged him to discuss this with his PCP.   8. Disposition: Follow-up in 3 months.     Joylene Grapes, NP 08/20/2022, 4:05 PM

## 2022-08-20 NOTE — Patient Instructions (Signed)
Medication Instructions:  Stop Lipitor as directed. Start Crestor 20 mg daily   *If you need a refill on your cardiac medications before your next appointment, please call your pharmacy*   Lab Work: Your physician recommends that you return for lab work in 6-8 weeks. Fasting lipid panel & LFTs   If you have labs (blood work) drawn today and your tests are completely normal, you will receive your results only by: MyChart Message (if you have MyChart) OR A paper copy in the mail If you have any lab test that is abnormal or we need to change your treatment, we will call you to review the results.   Testing/Procedures: NONE ordered at this time of appointment     Follow-Up: At [facility], you and your health needs are our priority.  As part of our continuing mission to provide you with exceptional heart care, we have created designated Provider Care Teams.  These Care Teams include your primary Cardiologist (physician) and Advanced Practice Providers (APPs -  Physician Assistants and Nurse Practitioners) who all work together to provide you with the care you need, when you need it.  We recommend signing up for the patient portal called "MyChart".  Sign up information is provided on this After Visit Summary.  MyChart is used to connect with patients for Virtual Visits (Telemedicine).  Patients are able to view lab/test results, encounter notes, upcoming appointments, etc.  Non-urgent messages can be sent to your provider as well.   To learn more about what you can do with MyChart, go to [email or web address].    Your next appointment:   3 month(s)  Provider:   Bernadene Person, NP        Other Instructions

## 2022-08-21 ENCOUNTER — Ambulatory Visit (INDEPENDENT_AMBULATORY_CARE_PROVIDER_SITE_OTHER): Payer: PRIVATE HEALTH INSURANCE | Admitting: Physician Assistant

## 2022-08-21 ENCOUNTER — Encounter: Payer: Self-pay | Admitting: Physician Assistant

## 2022-08-21 VITALS — BP 129/83 | HR 61 | Temp 97.7°F | Ht 68.0 in | Wt 255.4 lb

## 2022-08-21 DIAGNOSIS — E669 Obesity, unspecified: Secondary | ICD-10-CM | POA: Diagnosis not present

## 2022-08-21 DIAGNOSIS — Z951 Presence of aortocoronary bypass graft: Secondary | ICD-10-CM

## 2022-08-21 DIAGNOSIS — Z6838 Body mass index (BMI) 38.0-38.9, adult: Secondary | ICD-10-CM | POA: Diagnosis not present

## 2022-08-21 DIAGNOSIS — Z23 Encounter for immunization: Secondary | ICD-10-CM | POA: Diagnosis not present

## 2022-08-21 DIAGNOSIS — R739 Hyperglycemia, unspecified: Secondary | ICD-10-CM | POA: Diagnosis not present

## 2022-08-21 DIAGNOSIS — Z0000 Encounter for general adult medical examination without abnormal findings: Secondary | ICD-10-CM | POA: Diagnosis not present

## 2022-08-21 NOTE — Addendum Note (Signed)
Addended by: Ila Mcgill on: 08/21/2022 10:01 AM   Modules accepted: Orders

## 2022-08-21 NOTE — Progress Notes (Signed)
Subjective:    Patient ID: Keith Franklin, male    DOB: 04-24-1961, 62 y.o.   MRN: [id number]  Chief Complaint  Patient presents with   Annual Exam    Fasting w/ labs     HPI Patient is in today for annual exam.  Health maintenance: Lifestyle/ exercise: Battling plantar fasciitis, which has really affected his exercise; stretches often still; working out about 3 days / week  Nutrition: Needs to work on things; overall well-balanced; heart-healthy Mental health: Feeling great Sleep: Retired last month - sleeping better; has helped his stress levels  Substance use: None ETOH: None  Immunizations: Updating Tdap today  Colonoscopy: UTD  Prostate symptoms: 0-1 nighttime awakenings, Flomax 0.4 mg is helping   Past Medical History:  Diagnosis Date   Cancer 2010   skin cancer, Basal Cell Carcinoma    COVID-19 07/2019   GERD (gastroesophageal reflux disease)    History of kidney stones    Myocardial infarction 09/30/2020    Past Surgical History:  Procedure Laterality Date   CORONARY ARTERY BYPASS GRAFT N/A 10/03/2020   Procedure: CORONARY ARTERY BYPASS GRAFTING (CABG) X THREE, ON PUMP, USING LEFT INTERNAL MAMMARY ARTERY AND RIGHT GREATER SAPHENOUS ENDOSCOPIC VEIN HARVEST CONDUITS;  Surgeon: Loreli Slot, MD;  Location: [facility] OR;  Service: Open Heart Surgery;  Laterality: N/A;  will harvest left radial artery for conduit Swan   CORONARY/GRAFT ACUTE MI REVASCULARIZATION N/A 09/30/2020   Procedure: Coronary/Graft Acute MI Revascularization;  Surgeon: Swaziland, Peter M, MD;  Location: [facility] INVASIVE CV LAB;  Service: Cardiovascular;  Laterality: N/A;   CYSTOSCOPY WITH URETHRAL DILATATION N/A 04/10/2021   Procedure: CYSTOSCOPY WITH OPTILUME URETHRAL DILATATION;  Surgeon: Crista Elliot, MD;  Location: [facility] ORS;  Service: Urology;  Laterality: N/A;   LEFT HEART CATH AND CORONARY ANGIOGRAPHY N/A 09/30/2020   Procedure: LEFT HEART CATH AND CORONARY ANGIOGRAPHY;  Surgeon: Swaziland,  Peter M, MD;  Location: [facility] INVASIVE CV LAB;  Service: Cardiovascular;  Laterality: N/A;   skin cancer removal     TONSILLECTOMY     WISDOM TOOTH EXTRACTION      Family History  Problem Relation Age of Onset   Cancer Mother    Heart attack Father    Sleep apnea Neg Hx    Colon cancer Neg Hx     Social History   Tobacco Use   Smoking status: Never   Smokeless tobacco: Never  Vaping Use   Vaping Use: Never used  Substance Use Topics   Alcohol use: Not Currently    Comment: rare   Drug use: Never     No Known Allergies  Review of Systems NEGATIVE UNLESS OTHERWISE INDICATED IN HPI      Objective:     BP 129/83 (BP Location: Left Arm, Patient Position: Sitting, Cuff Size: Large)   Pulse 61   Temp 97.7 F (36.5 C) (Temporal)   Ht  (1.727 m)   Wt 255 lb 6 oz (115.8 kg)   SpO2 97%   BMI 38.83 kg/m   Wt Readings from Last 3 Encounters:  08/21/22 255 lb 6 oz (115.8 kg)  08/20/22 257 lb 6.4 oz (116.8 kg)  04/12/22 253 lb (114.8 kg)    BP Readings from Last 3 Encounters:  08/21/22 129/83  08/20/22 124/84  04/12/22 128/75     Physical Exam Vitals and nursing note reviewed.  Constitutional:      General: He is not in acute distress.    Appearance: Normal appearance.  He is obese. He is not toxic-appearing.  HENT:     Head: Normocephalic and atraumatic.     Right Ear: External ear normal.     Left Ear: External ear normal.     Nose: Nose normal.     Mouth/Throat:     Mouth: Mucous membranes are moist.     Pharynx: Oropharynx is clear.  Eyes:     Extraocular Movements: Extraocular movements intact.     Conjunctiva/sclera: Conjunctivae normal.     Pupils: Pupils are equal, round, and reactive to light.  Cardiovascular:     Rate and Rhythm: Normal rate and regular rhythm.     Pulses: Normal pulses.     Heart sounds: Normal heart sounds.  Pulmonary:     Effort: Pulmonary effort is normal.     Breath sounds: Normal breath sounds.  Chest:      Comments: Vertical scar chest wall s/p CABG with keloid - much improved since last visit Abdominal:     General: Abdomen is flat. Bowel sounds are normal.     Palpations: Abdomen is soft.     Tenderness: There is no abdominal tenderness.  Musculoskeletal:        General: Normal range of motion.     Cervical back: Normal range of motion and neck supple.  Skin:    General: Skin is warm and dry.  Neurological:     General: No focal deficit present.     Mental Status: He is alert and oriented to person, place, and time.  Psychiatric:        Mood and Affect: Mood normal.        Behavior: Behavior normal.        Assessment & Plan:  Encounter for annual physical exam -     CBC with Differential/Platelet -     Comprehensive metabolic panel -     Lipid panel -     PSA -     TSH  S/P CABG x 3 -     Amb Referral to Nutrition and Diabetic Education  BMI 38.0-38.9,adult -     Amb Referral to Nutrition and Diabetic Education   Age-appropriate screening and counseling performed today. Will check labs and call with results. Preventive measures discussed and printed in AVS for patient.   Patient Counseling:   Nutrition: Stressed importance of moderation in sodium/caffeine intake, saturated fat and cholesterol, caloric balance, sufficient intake of fresh fruits, vegetables, and fiber.    Stressed the importance of regular exercise.     Substance Abuse: Discussed cessation/primary prevention of tobacco, alcohol, or other drug use; driving or other dangerous activities under the influence; availability of treatment for abuse.     Injury prevention: Discussed safety belts, safety helmets, smoke detector, smoking near bedding or upholstery.     Sexuality: Discussed sexually transmitted diseases, partner selection, use of condoms, avoidance of unintended pregnancy  and contraceptive alternatives.     Dental health: Discussed importance of regular tooth brushing, flossing, and  dental visits.    Health maintenance and immunizations reviewed. Please refer to Health maintenance section.     Tdap updated  Referral to nutritionist     Return in about 1 year (around 08/21/2023) for physical.    Michol Emory M Chennel Olivos, PA-C

## 2022-08-21 NOTE — Patient Instructions (Addendum)
Keep up the good work!   Labs today, will fwd to your cardiologist.  Referral to nutritionist.  Tdap updated

## 2022-08-22 ENCOUNTER — Other Ambulatory Visit: Payer: Self-pay | Admitting: Physician Assistant

## 2022-08-22 ENCOUNTER — Other Ambulatory Visit: Payer: PRIVATE HEALTH INSURANCE

## 2022-08-22 ENCOUNTER — Encounter: Payer: Self-pay | Admitting: Nurse Practitioner

## 2022-08-22 DIAGNOSIS — R739 Hyperglycemia, unspecified: Secondary | ICD-10-CM

## 2022-08-22 LAB — CBC WITH DIFFERENTIAL/PLATELET
Basophils Absolute: 0.1 10*3/uL (ref 0.0–0.2)
Basos: 1 %
EOS (ABSOLUTE): 0 10*3/uL (ref 0.0–0.4)
Eos: 1 %
Hematocrit: 46.7 % (ref 37.5–51.0)
Hemoglobin: 15.6 g/dL (ref 13.0–17.7)
Immature Grans (Abs): 0 10*3/uL (ref 0.0–0.1)
Immature Granulocytes: 0 %
Lymphocytes Absolute: 2.4 10*3/uL (ref 0.7–3.1)
Lymphs: 34 %
MCH: 31.6 pg (ref 26.6–33.0)
MCHC: 33.4 g/dL (ref 31.5–35.7)
MCV: 95 fL (ref 79–97)
Monocytes Absolute: 0.8 10*3/uL (ref 0.1–0.9)
Monocytes: 12 %
Neutrophils Absolute: 3.6 10*3/uL (ref 1.4–7.0)
Neutrophils: 52 %
Platelets: 239 10*3/uL (ref 150–450)
RBC: 4.93 x10E6/uL (ref 4.14–5.80)
RDW: 12.5 % (ref 11.6–15.4)
WBC: 6.9 10*3/uL (ref 3.4–10.8)

## 2022-08-22 LAB — COMPREHENSIVE METABOLIC PANEL
ALT: 20 IU/L (ref 0–44)
AST: 22 IU/L (ref 0–40)
Albumin/Globulin Ratio: 2.6 — ABNORMAL HIGH (ref 1.2–2.2)
Albumin: 4.4 g/dL (ref 3.9–4.9)
Alkaline Phosphatase: 68 IU/L (ref 44–121)
BUN/Creatinine Ratio: 18 (ref 10–24)
BUN: 18 mg/dL (ref 8–27)
Bilirubin Total: 0.6 mg/dL (ref 0.0–1.2)
CO2: 21 mmol/L (ref 20–29)
Calcium: 9.5 mg/dL (ref 8.6–10.2)
Chloride: 104 mmol/L (ref 96–106)
Creatinine, Ser: 1 mg/dL (ref 0.76–1.27)
Globulin, Total: 1.7 g/dL (ref 1.5–4.5)
Glucose: 108 mg/dL — ABNORMAL HIGH (ref 70–99)
Potassium: 4.6 mmol/L (ref 3.5–5.2)
Sodium: 140 mmol/L (ref 134–144)
Total Protein: 6.1 g/dL (ref 6.0–8.5)
eGFR: 85 mL/min/{1.73_m2} (ref >59)

## 2022-08-22 LAB — LIPID PANEL
Chol/HDL Ratio: 2.8 ratio (ref 0.0–5.0)
Cholesterol, Total: 116 mg/dL (ref 100–199)
HDL: 41 mg/dL (ref >39)
LDL Chol Calc (NIH): 62 mg/dL (ref 0–99)
Triglycerides: 61 mg/dL (ref 0–149)
VLDL Cholesterol Cal: 13 mg/dL (ref 5–40)

## 2022-08-22 LAB — PSA: Prostate Specific Ag, Serum: 0.6 ng/mL (ref 0.0–4.0)

## 2022-08-22 LAB — POCT GLYCOSYLATED HEMOGLOBIN (HGB A1C): Hemoglobin A1C: 5.5 % (ref 4.0–5.6)

## 2022-08-22 LAB — TSH: TSH: 2.28 u[IU]/mL (ref 0.450–4.500)

## 2022-08-22 NOTE — Addendum Note (Signed)
Addended by: Laddie Aquas A on: 08/22/2022 01:58 PM   Modules accepted: Orders

## 2022-08-22 NOTE — Addendum Note (Signed)
Addended by: Laddie Aquas A on: 08/22/2022 12:05 PM   Modules accepted: Orders

## 2022-08-24 ENCOUNTER — Encounter: Payer: Self-pay | Admitting: Physician Assistant

## 2022-08-24 NOTE — Telephone Encounter (Signed)
Please see pt message and advise 

## 2022-10-22 ENCOUNTER — Ambulatory Visit: Payer: PRIVATE HEALTH INSURANCE | Admitting: Dietician

## 2022-11-20 ENCOUNTER — Other Ambulatory Visit: Payer: PRIVATE HEALTH INSURANCE

## 2022-11-20 ENCOUNTER — Other Ambulatory Visit: Payer: Self-pay

## 2022-11-20 DIAGNOSIS — R739 Hyperglycemia, unspecified: Secondary | ICD-10-CM | POA: Diagnosis not present

## 2022-11-20 DIAGNOSIS — E785 Hyperlipidemia, unspecified: Secondary | ICD-10-CM

## 2022-11-20 LAB — HEMOGLOBIN A1C: Hgb A1c MFr Bld: 5.9 % (ref 4.6–6.5)

## 2022-11-20 NOTE — Addendum Note (Signed)
Addended by: Dorris Fetch on: 11/20/2022 08:37 AM   Modules accepted: Orders

## 2022-11-21 LAB — HEPATIC FUNCTION PANEL
ALT: 20 IU/L (ref 0–44)
AST: 25 IU/L (ref 0–40)
Albumin: 4.2 g/dL (ref 3.9–4.9)
Alkaline Phosphatase: 57 IU/L (ref 44–121)
Bilirubin Total: 0.7 mg/dL (ref 0.0–1.2)
Bilirubin, Direct: 0.2 mg/dL (ref 0.00–0.40)
Total Protein: 6.3 g/dL (ref 6.0–8.5)

## 2022-11-21 LAB — LIPID PANEL
Chol/HDL Ratio: 2.3 ratio (ref 0.0–5.0)
Cholesterol, Total: 102 mg/dL (ref 100–199)
HDL: 44 mg/dL (ref >39)
LDL Chol Calc (NIH): 42 mg/dL (ref 0–99)
Triglycerides: 75 mg/dL (ref 0–149)
VLDL Cholesterol Cal: 16 mg/dL (ref 5–40)

## 2022-11-21 NOTE — Progress Notes (Unsigned)
Cardiology Office Note:  .   Date:  11/22/2022  ID:  Keith Franklin, DOB 05-19-1960, MRN [id number] PCP: Allwardt, Crist Infante, PA-C  [facility] Providers Cardiologist:  Peter Swaziland, MD    History of Present Illness: .   Keith Franklin is a 62 y.o. male with past meical history of CAD s/p CABGx3 (LIMA-LAD, SVG-D1 and SVG-OM) in 09/2020, PEA/cardiac arrest at time of surgery, hypertension, hyperlipidemia, OSA and GERD who presents for follow up for CAD.   In June 2022 he was hospitalized in the setting of NSTEMI.  He underwent emergent cardiac catheterization that revealed multivessel CAD.  10/03/2020 he underwent CABG x 3.  While his lines were being placed and he was given sedation he had a bradycardic event with PEA and cardiac arrest with ROSC following less than 2 minutes of CPR.  Echocardiogram in October 2022 40 to 45%, mildly decreased LV function, LV global hypokinesis, G 2 DD, mildly reduced RV systolic function, moderately enlarged RV, moderately dilated RA, no significant valvular abnormalities, mild dilation of ascending aorta at 41 mm.  Known history of palpitations managed on metoprolol.  On 03/13/2022 he presented to the ED with nonexertional chest pressure.  Troponin was negative x 2, EKG was unchanged from prior (T wave inversion in aVL), chest x-ray was unremarkable.  His symptoms resolved and he was discharged home in stable condition advised to follow-up with cardiology as outpatient.  He was seen in office on 04/12/2022 and was stable from a cardiac standpoint.  Repeat echocardiogram showed EF 40 to 45%, mildly decreased LV function, normal RV systolic function, mild mitral valve regurgitation, no evidence of aortic dilation.  He was last seen in office on 08/20/2022 and was reported as stable from a cardiac standpoint.  He denied any symptoms concerning for angina.  Given repeat echo in December showed EF 40 to 45% setting of Entresto was considered but patient has history  of significant hypertension on lisinopril, it was felt that his blood pressure would not tolerate.  He reported generalized muscle pain since his CABG statin myalgia was question, his Lipitor was stopped and he was started on Crestor 20 mg daily.  Today he reports that he had been doing well up until Monday.  At that time he noted feelings of palpitations or "hard heart beats" that would last for about 10 seconds, along with a chest pressure described as a "gas like feeling in his chest."  This pressure sensation was constant, lasted until 3 AM this morning, he did take an additional metoprolol succinate on Monday with no change in symptoms.  Noted that he has still been going to the gym and exercising as normal with this pressure and actually noted improvement with exercise.  On visit today he denies current feeling of pressure or palpitations.  He wears an Scientist, physiological and has tried to obtain EKGs when feeling palpitations, on review 1 EKG does indicate a PVC.  Per patient this is overall feeling pressure and palpitations with similar to what sent him to the emergency room in November 2023 at which his cardiac workup was unremarkable.  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  Prior to these episodes he denied feelings of palpitations, chest pain or pressure. Does note drinking two cups of coffee each morning, had been planning to reduce to one.  He exercises for 60 minutes 6 days a week, has  had no anginal symptoms with this.  Following switch from Lipitor to Crestor his generalized body aches have resolved.   ROS: Today he denies shortness of breath, lower extremity edema, fatigue, melena, hematuria, hemoptysis, diaphoresis, weakness, presyncope, syncope, orthopnea, and PND. All other systems reviewed and are otherwise negative except as noted above.    Studies Reviewed: Marland Kitchen   EKG  Interpretation Date/Time:  Thursday November 22 2022 10:44:49 EDT Ventricular Rate:  65 PR Interval:  174 QRS Duration:  90 QT Interval:  406 QTC Calculation: 422 R Axis:   -21  Text Interpretation: Normal sinus rhythm Normal ECG Confirmed by Reather Littler [phone]) on 11/22/2022 10:52:22 AM   Cardiac Studies & Procedures   CARDIAC CATHETERIZATION  CARDIAC CATHETERIZATION 09/30/2020  Narrative  Prox LAD to Mid LAD lesion is 90% stenosed.  Mid LAD lesion is 90% stenosed.  1st Diag lesion is 40% stenosed.  Mid Cx to Dist Cx lesion is 100% stenosed.  RV Branch lesion is 90% stenosed.  2nd RPL lesion is 70% stenosed.  The left ventricular systolic function is normal.  LV end diastolic pressure is normal.  The left ventricular ejection fraction is 50-55% by visual estimate.  Post intervention, there is a 40% residual stenosis.  Balloon angioplasty was performed using a BALLOON SAPPHIRE 2.5X12.  1. Severe 2 vessel obstructive CAD. There is severe diffuse segmental disease in the proximal to mid LAD 90% 100% occlusion of the LCx distally with thrombus 2. EF 50-55% with mid inferior HK 3. Normal LV EDP 4. Successful PCI of the distal LCx with POBA.  Plan: will resume IV heparin in 8 hours. Continue IV Aggrastat. Initiate beta blocker and high dose statin. Continue IV Ntg. Will consult CT surgery for CABG this admission given severe LAD disease. The LAD and OM 2 and 3 appear to be acceptable targets.  Findings Coronary Findings Diagnostic  Dominance: Right  Left Anterior Descending Prox LAD to Mid LAD lesion is 90% stenosed. The lesion is segmental. Mid LAD lesion is 90% stenosed. The lesion is segmental.  First Diagonal Branch 1st Diag lesion is 40% stenosed.  Left Circumflex Mid Cx to Dist Cx lesion is 100% stenosed. The lesion is thrombotic.  First Obtuse Marginal Branch Vessel is small in size.  Right Coronary Artery  Right Ventricular Branch RV Branch lesion is  90% stenosed.  Second Right Posterolateral Branch 2nd RPL lesion is 70% stenosed.  Intervention  Mid Cx to Dist Cx lesion Angioplasty CATH LAUNCHER 6FR EBU3.5 guide catheter was inserted. WIRE ASAHI PROWATER 180CM guidewire used to cross lesion. Balloon angioplasty was performed using a BALLOON SAPPHIRE 2.5X12. Maximum pressure: 8 atm. Post-Intervention Lesion Assessment The intervention was successful. Pre-interventional TIMI flow is 0. Post-intervention TIMI flow is 3. No complications occurred at this lesion. There is a 40% residual stenosis post intervention.     ECHOCARDIOGRAM  ECHOCARDIOGRAM COMPLETE 04/16/2022  Narrative ECHOCARDIOGRAM REPORT    Patient Name:   Keith Franklin Date of Exam: 04/16/2022 Medical Rec #:  [id number]       Height:       68.0 in Accession #:    [id number]      Weight:       253.0 lb Date of Birth:  01-23-1961      BSA:          2.258 m Patient Age:    61 years        BP:           128/75 mmHg  Patient Gender: M               HR:           66 bpm. 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 Cardiomyopathy, CAD and Previous Myocardial Infarction, Prior CABG, Signs/Symptoms:Chest Pain; Risk Factors:Family History of Coronary Artery Disease, Hypertension, Dyslipidemia and Sleep Apnea. Obesity, PEA Arrest.  Sonographer:    Farrel Conners RDCS Referring Phys: EMILY C MONGE  IMPRESSIONS   1. Difficult apical windows. Endocardium is difficult to see well. The mid/distal anterior distal lateral and apical walls appear hypokinetic Consider limited echo with Definity to confirm wall motion and LVEF IN comparison there does not appear to be signifcant change in LVEF from echo in 2022. Left ventricular ejection fraction, by estimation,  is 40 to 45%. The left ventricle has mildly decreased function. There is mild left ventricular hypertrophy. Left ventricular diastolic parameters were normal. 2. Right ventricular systolic function is normal. The right ventricular size is normal. There is normal pulmonary artery systolic pressure. 3. Left atrial size was mildly dilated. 4. Right atrial size was mildly dilated. 5. The mitral valve is normal in structure. Mild mitral valve regurgitation. 6. The aortic valve is tricuspid. Aortic valve regurgitation is not visualized. Aortic valve sclerosis is present, with no evidence of aortic valve stenosis. 7. The inferior vena cava is normal in size with greater than 50% respiratory variability, suggesting right atrial pressure of 3 mmHg.  FINDINGS Left Ventricle: Difficult apical windows. Endocardium is difficult to see well. The mid/distal anterior distal lateral and apical walls appear hypokinetic Consider limited echo with Definity to confirm wall motion and LVEF IN comparison there does not appear to be signifcant change in LVEF from echo in 2022. Left ventricular ejection fraction, by estimation, is 40 to 45%. The left ventricle has mildly decreased function. The left ventricular internal cavity size was normal in size. There is mild left ventricular hypertrophy. Left ventricular diastolic parameters were normal.  Right Ventricle: The right ventricular size is normal. Right vetricular wall thickness was not assessed. Right ventricular systolic function is normal. There is normal pulmonary artery systolic pressure. The tricuspid regurgitant velocity is 1.90 m/s, and with an assumed right atrial pressure of 3 mmHg, the estimated right ventricular systolic pressure is 17.4 mmHg.  Left Atrium: Left atrial size was mildly dilated.  Right Atrium: Right atrial size was mildly dilated.  Pericardium: Trivial pericardial effusion is present.  Mitral Valve: The mitral valve is normal in structure.  Mild mitral valve regurgitation.  Tricuspid Valve: The tricuspid valve is normal in structure. Tricuspid valve regurgitation is mild.  Aortic Valve: The aortic valve is tricuspid. Aortic valve regurgitation is not visualized. Aortic valve sclerosis is present, with no evidence of aortic valve stenosis.  Pulmonic Valve: The pulmonic valve was normal in structure. Pulmonic valve regurgitation is not visualized.  Aorta: The aortic root and ascending aorta are structurally normal, with no evidence of dilitation.  Venous: The inferior vena cava is normal in size with greater than 50% respiratory variability, suggesting right atrial pressure of 3 mmHg.  IAS/Shunts: No atrial level shunt detected by color flow Doppler.   LEFT VENTRICLE PLAX 2D LVIDd:         5.40 cm   Diastology LVIDs:  3.80 cm   LV e' medial:    6.20 cm/s LV PW:         1.00 cm   LV E/e' medial:  11.5 LV IVS:        1.30 cm   LV e' lateral:   11.80 cm/s LVOT diam:     2.40 cm   LV E/e' lateral: 6.0 LV SV:         96 LV SV Index:   43        2D Longitudinal Strain LVOT Area:     4.52 cm  2D Strain GLS (A2C):   -22.2 % 2D Strain GLS (A3C):   -20.2 % 2D Strain GLS (A4C):   -20.5 % 2D Strain GLS Avg:     -21.0 %  3D Volume EF: 3D EF:        55 % LV EDV:       205 ml LV ESV:       92 ml LV SV:        114 ml  RIGHT VENTRICLE RV Basal diam:  4.50 cm RV Mid diam:    3.80 cm RV S prime:     12.90 cm/s TAPSE (M-mode): 2.0 cm  LEFT ATRIUM             Index        RIGHT ATRIUM           Index LA diam:        5.30 cm 2.35 cm/m   RA Area:     25.00 cm LA Vol (A2C):   61.7 ml 27.32 ml/m  RA Volume:   82.40 ml  36.48 ml/m LA Vol (A4C):   98.7 ml 43.70 ml/m LA Biplane Vol: 82.4 ml 36.48 ml/m AORTIC VALVE LVOT Vmax:   96.25 cm/s LVOT Vmean:  62.600 cm/s LVOT VTI:    0.213 m  AORTA Ao Root diam: 3.80 cm Ao Asc diam:  4.10 cm  MITRAL VALVE               TRICUSPID VALVE MV Area (PHT): cm         TR Peak  grad:   14.4 mmHg MV Decel Time: 236 msec    TR Vmax:        190.00 cm/s MR Peak grad: 113.2 mmHg MR Mean grad: 79.0 mmHg    SHUNTS MR Vmax:      532.00 cm/s  Systemic VTI:  0.21 m MR Vmean:     427.0 cm/s   Systemic Diam: 2.40 cm MV E velocity: 71.10 cm/s MV A velocity: 71.95 cm/s MV E/A ratio:  0.99  Dietrich Pates MD Electronically signed by Dietrich Pates MD Signature Date/Time: 04/16/2022/6:17:39 PM    Final   TEE  ECHO INTRAOPERATIVE TEE 10/03/2020  Narrative *INTRAOPERATIVE TRANSESOPHAGEAL REPORT *    Patient Name:   Keith Franklin Date of Exam: 10/03/2020 Medical Rec #:  [id number]       Height:       68.0 in Accession #:    [id number]      Weight:       254.9 lb Date of Birth:  1960-06-01      BSA:          2.27 m Patient Age:    59 years        BP:           120/78 mmHg Patient Gender: M  HR:           112 bpm. Exam Location:  Inpatient  Transesophogeal exam was perform intraoperatively during surgical procedure. Patient was closely monitored under general anesthesia during the entirety of examination.  Indications:     CABG Sonographer:     Ross Ludwig RDCS (AE) Performing Phys: Karna Christmas MD Diagnosing Phys: Karna Christmas MD  Complications: No known complications during this procedure. POST-OP IMPRESSIONS - Left Ventricle: The left ventricle is unchanged from pre-bypass. - Right Ventricle: The right ventricle appears unchanged from pre-bypass. - Aorta: The aorta appears unchanged from pre-bypass. - Left Atrial Appendage: The left atrial appendage appears unchanged from pre-bypass. - Aortic Valve: The aortic valve appears unchanged from pre-bypass. - Mitral Valve: The mitral valve appears unchanged from pre-bypass. - Tricuspid Valve: There is trivial regurgitation. - Pulmonic Valve: The pulmonic valve appears unchanged from pre-bypass. - Interatrial Septum: The interatrial septum appears unchanged from pre-bypass. - Pericardium: The pericardium  appears unchanged from pre-bypass.  PRE-OP FINDINGS Left Ventricle: The left ventricle has normal systolic function, with an ejection fraction of 55-60%. The cavity size was normal. There is no increase in left ventricular wall thickness. There is no left ventricular hypertrophy.  Right Ventricle: The right ventricle has normal systolic function. The cavity was normal. There is no increase in right ventricular wall thickness.  Left Atrium: Left atrial size was normal in size. No left atrial/left atrial appendage thrombus was detected.  Right Atrium: Right atrial size was normal in size.  Interatrial Septum: No atrial level shunt detected by color flow Doppler.  Pericardium: There is no evidence of pericardial effusion.  Mitral Valve: The mitral valve is normal in structure. Mitral valve regurgitation is trivial by color flow Doppler.  Tricuspid Valve: The tricuspid valve was normal in structure. Tricuspid valve regurgitation was not visualized by color flow Doppler.  Aortic Valve: The aortic valve is normal in structure. Aortic valve regurgitation was not visualized by color flow Doppler.  Pulmonic Valve: The pulmonic valve was normal in structure. Pulmonic valve regurgitation is not visualized by color flow Doppler.   Aorta: The aortic root, ascending aorta and aortic arch are normal in size and structure.  +--------------+--------++ LEFT VENTRICLE         +--------------+--------++ PLAX 2D                +--------------+--------++ LVIDd:        6.20 cm  +--------------+--------++ LVIDs:        4.30 cm  +--------------+--------++ LVOT diam:    2.10 cm  +--------------+--------++ LV SV:        111 ml   +--------------+--------++ LV SV Index:  46.10    +--------------+--------++ LVOT Area:    3.46 cm +--------------+--------++                        +--------------+--------++   +-------------+-------++ AORTA                 +-------------+-------++ Ao Root diam:4.00 cm +-------------+-------++   +--------------+-------+ SHUNTS                +--------------+-------+ Systemic Diam:2.10 cm +--------------+-------+   Karna Christmas MD Electronically signed by Karna Christmas MD Signature Date/Time: 10/03/2020/4:55:50 PM    Final            Physical Exam:   VS:  BP 130/84 (BP Location: Left Arm, Patient Position: Sitting)   Pulse 72   Ht 5\' 8"  (1.727  m)   Wt 256 lb 3.2 oz (116.2 kg)   SpO2 96%   BMI 38.96 kg/m    Wt Readings from Last 3 Encounters:  11/22/22 256 lb 3.2 oz (116.2 kg)  08/21/22 255 lb 6 oz (115.8 kg)  08/20/22 257 lb 6.4 oz (116.8 kg)    GEN: Well nourished, well developed in no acute distress NECK: No JVD; No carotid bruits CARDIAC: RRR, no murmurs, rubs, gallops RESPIRATORY:  Clear to auscultation without rales, wheezing or rhonchi  ABDOMEN: Soft, non-tender, non-distended EXTREMITIES:  No edema; No deformity   ASSESSMENT AND PLAN: .    Palpitations/Chest Pressure: Per patient he has a history of palpitations and chest pressure that sent him to the ED in November, workup was unremarkable.  He noted that Dr. Swaziland has told him before that he has occasional PVCs. Monday morning he noted feelings of palpitations or "hard heart beats" that would last for about 10 seconds, along with a chest pressure described as a "gas like feeling in his chest," he did take an additional metoprolol succinate on Monday with no change in symptoms. This pressure sensation lasted until 3 AM this morning, with ongoing intermittent palpitations. Noted that he has still been going to the gym and exercising as normal with this pressure and actually noted improvement with exercise. He was able to capture a few EKGs on his apple watch, reviewed and one does indicate a PVC, no atrial fibrillation. In office EKG shows normal sinus rhythm. He will continue to monitor his palpitations with his apple  watch, he will take 25mg  metoprolol tartrate if prolonged feelings or worsening of palpations, which he has at home. Reviewed ED precautions.   CAD: S/p CABGx3 (LIMA-LAD, SVG-D1 and SVG-OM)  in 09/2020. On 03/13/2022 presented to the ED with nonexertional chest pressure.  Workup was unremarkable including negative troponin's.  Today he presents with reports of 3 days of chest pressure not associated with exertion, that resolved this morning, notes it is very similar to feelings of sent him to the emergency room in November, at which he had an unremarkable cardiac workup.  He has been exercising with improvement in symptoms.  Prior to this he was exercising for 60 minutes 6 days a week with no anginal symptoms. Given he has been exercising with improvement in symptoms will defer ischemic workup at this time. Reviewed ED precautions. Continue aspirin, metoprolol and crestor.  ICM: Following CABG in 01/2021 EF was 40 to 45%.  Repeat echo in 03/2022 showed EF 40 to 45%, mildly decreased LV function, normal RV systolic function, mild mitral valve regurgitation and no evidence of aortic dilation.  He had previously discussed trying Entresto for cardiomyopathy but given patient's history of significant hypertension with lisinopril his blood pressure would likely not tolerate this. He appears euvolemic and well compensated on exam today. Continue metoprolol.   Hypertension:Blood pressure well controlled today at 130/84. He has not tolerated Lisinopril in the past, resulted in significant hypotension. He will monitor his blood pressure at home and notify the office if consistently elevated.   Hyperlipidemia: At last visit on 08/20/2022 he reported generalized muscle pain since CABG and he was switched from Lipitor to Crestor 20 mg daily.  Lipid profile on 11/20/2022 indicated total cholesterol of 102, triglycerides 75 and LDL of 42.  Continue Crestor 20 mg daily.  Dilation of ascending aorta: Echocardiogram in October  2022 ascending aorta was measured at 41 mm.  This was not appreciated on repeat echo in 03/2022.  OSA: Adherent to CPAP  Obesity: He has been working to lose weight, exercising for 60 minutes a day, six days a week. Last visit it was discussed trying a GLP-1, but his PCP does not want him to start this at this time. Was referred to a nutritionist by PCP.        Dispo: Follow up with Bernadene Person, NP in 3 months.   Signed, Rip Harbour, NP

## 2022-11-22 ENCOUNTER — Encounter: Payer: Self-pay | Admitting: Nurse Practitioner

## 2022-11-22 ENCOUNTER — Ambulatory Visit: Payer: PRIVATE HEALTH INSURANCE | Admitting: Cardiology

## 2022-11-22 VITALS — BP 130/84 | HR 72 | Ht 68.0 in | Wt 256.2 lb

## 2022-11-22 DIAGNOSIS — I2510 Atherosclerotic heart disease of native coronary artery without angina pectoris: Secondary | ICD-10-CM

## 2022-11-22 DIAGNOSIS — I77819 Aortic ectasia, unspecified site: Secondary | ICD-10-CM

## 2022-11-22 DIAGNOSIS — E785 Hyperlipidemia, unspecified: Secondary | ICD-10-CM

## 2022-11-22 DIAGNOSIS — I255 Ischemic cardiomyopathy: Secondary | ICD-10-CM

## 2022-11-22 DIAGNOSIS — Z951 Presence of aortocoronary bypass graft: Secondary | ICD-10-CM | POA: Diagnosis not present

## 2022-11-22 DIAGNOSIS — R002 Palpitations: Secondary | ICD-10-CM

## 2022-11-22 DIAGNOSIS — I10 Essential (primary) hypertension: Secondary | ICD-10-CM

## 2022-11-22 DIAGNOSIS — G4733 Obstructive sleep apnea (adult) (pediatric): Secondary | ICD-10-CM

## 2022-11-22 DIAGNOSIS — E669 Obesity, unspecified: Secondary | ICD-10-CM

## 2022-11-22 MED ORDER — NITROGLYCERIN 0.4 MG SL SUBL: 0.4000 mg | SUBLINGUAL_TABLET | SUBLINGUAL | 3 refills | Status: DC | PRN

## 2022-11-22 NOTE — Patient Instructions (Signed)
Medication Instructions:  Your physician has recommended you make the following change in your medication:  START Nitroglycerin 0.4mg  sublingual (under tongue) as needed.   If a single episode of chest pain is not relieved by one tablet, the patient will try another within 5 minutes; and if this doesn't relieve the pain, the patient will try another within 5 minutes and if this doesn't relieve the pain the patient is instructed to call 911 for transportation to an emergency department.   *If you need a refill on your cardiac medications before your next appointment, please call your pharmacy*   Lab Work: None  If you have labs (blood work) drawn today and your tests are completely normal, you will receive your results only by: MyChart Message (if you have MyChart) OR A paper copy in the mail If you have any lab test that is abnormal or we need to change your treatment, we will call you to review the results.   Testing/Procedures: None   Follow-Up: At [facility], you and your health needs are our priority.  As part of our continuing mission to provide you with exceptional heart care, we have created designated Provider Care Teams.  These Care Teams include your primary Cardiologist (physician) and Advanced Practice Providers (APPs -  Physician Assistants and Nurse Practitioners) who all work together to provide you with the care you need, when you need it.  We recommend signing up for the patient portal called "MyChart".  Sign up information is provided on this After Visit Summary.  MyChart is used to connect with patients for Virtual Visits (Telemedicine).  Patients are able to view lab/test results, encounter notes, upcoming appointments, etc.  Non-urgent messages can be sent to your provider as well.   To learn more about what you can do with MyChart, go to [email or web address].    Your next appointment:   3 month(s)  Provider:   Bernadene Person, NP        Other  Instructions Your physician has requested that you regularly monitor and record your blood pressure readings at home. Please use the same machine at the same time of day to check your readings and record them to bring to your follow-up visit.   Please monitor blood pressures and keep a log of your readings. Please report via MyChart or call the office if you are having consistent blood pressure readings of 140/80.    Make sure to check 2 hours after your medications.    AVOID these things for 30 minutes before checking your blood pressure: No Drinking caffeine. No Drinking alcohol. No Eating. No Smoking. No Exercising.   Five minutes before checking your blood pressure: Pee. Sit in a dining chair. Avoid sitting in a soft couch or armchair. Be quiet. Do not talk

## 2022-11-23 MED ORDER — METOPROLOL SUCCINATE ER 25 MG PO TB24: 37.5000 mg | ORAL_TABLET | Freq: Every day | ORAL | 1 refills | Status: DC

## 2022-11-26 MED ORDER — METOPROLOL SUCCINATE ER 25 MG PO TB24: 37.5000 mg | ORAL_TABLET | Freq: Every day | ORAL | 0 refills | Status: DC

## 2023-02-25 ENCOUNTER — Ambulatory Visit: Payer: PRIVATE HEALTH INSURANCE | Admitting: Nurse Practitioner

## 2023-03-05 ENCOUNTER — Other Ambulatory Visit (HOSPITAL_BASED_OUTPATIENT_CLINIC_OR_DEPARTMENT_OTHER): Payer: Self-pay

## 2023-03-05 MED ORDER — COMIRNATY 30 MCG/0.3ML IM SUSY
0.3000 mL | PREFILLED_SYRINGE | Freq: Once | INTRAMUSCULAR | 0 refills | Status: AC
  Filled 2023-03-05: qty 0.3, 1d supply, fill #0

## 2023-03-05 MED ORDER — FLULAVAL 0.5 ML IM SUSY
0.5000 mL | PREFILLED_SYRINGE | Freq: Once | INTRAMUSCULAR | 0 refills | Status: AC
  Filled 2023-03-05: qty 0.5, 1d supply, fill #0

## 2023-03-11 ENCOUNTER — Encounter: Payer: Self-pay | Admitting: Nurse Practitioner

## 2023-03-11 ENCOUNTER — Ambulatory Visit: Payer: PRIVATE HEALTH INSURANCE | Attending: Nurse Practitioner | Admitting: Nurse Practitioner

## 2023-03-11 ENCOUNTER — Other Ambulatory Visit (HOSPITAL_BASED_OUTPATIENT_CLINIC_OR_DEPARTMENT_OTHER): Payer: Self-pay

## 2023-03-11 VITALS — BP 124/86 | HR 75 | Ht 68.0 in | Wt 256.2 lb

## 2023-03-11 DIAGNOSIS — G4733 Obstructive sleep apnea (adult) (pediatric): Secondary | ICD-10-CM

## 2023-03-11 DIAGNOSIS — E785 Hyperlipidemia, unspecified: Secondary | ICD-10-CM | POA: Diagnosis not present

## 2023-03-11 DIAGNOSIS — I10 Essential (primary) hypertension: Secondary | ICD-10-CM

## 2023-03-11 DIAGNOSIS — I2510 Atherosclerotic heart disease of native coronary artery without angina pectoris: Secondary | ICD-10-CM

## 2023-03-11 DIAGNOSIS — I255 Ischemic cardiomyopathy: Secondary | ICD-10-CM | POA: Diagnosis not present

## 2023-03-11 DIAGNOSIS — I77810 Thoracic aortic ectasia: Secondary | ICD-10-CM

## 2023-03-11 DIAGNOSIS — E669 Obesity, unspecified: Secondary | ICD-10-CM

## 2023-03-11 NOTE — Patient Instructions (Addendum)
Medication Instructions:  Decrease Metoprolol 25 mg daily Hold Crestor for 2 weeks. Please notify our office of your symptoms.   *If you need a refill on your cardiac medications before your next appointment, please call your pharmacy*   Lab Work: NONE ordered at this time of appointment   Testing/Procedures: Your physician has requested that you have an echocardiogram. Echocardiography is a painless test that uses sound waves to create images of your heart. It provides your doctor with information about the size and shape of your heart and how well your heart's chambers and valves are working. This procedure takes approximately one hour. There are no restrictions for this procedure. Please do NOT wear cologne, perfume, aftershave, or lotions (deodorant is allowed). Please arrive 15 minutes prior to your appointment time.  Please note: We ask at that you not bring children with you during ultrasound (echo/ vascular) testing. Due to room size and safety concerns, children are not allowed in the ultrasound rooms during exams. Our front office staff cannot provide observation of children in our lobby area while testing is being conducted. An adult accompanying a patient to their appointment will only be allowed in the ultrasound room at the discretion of the ultrasound technician under special circumstances. We apologize for any inconvenience.    Follow-Up: At [facility], you and your health needs are our priority.  As part of our continuing mission to provide you with exceptional heart care, we have created designated Provider Care Teams.  These Care Teams include your primary Cardiologist (physician) and Advanced Practice Providers (APPs -  Physician Assistants and Nurse Practitioners) who all work together to provide you with the care you need, when you need it.  We recommend signing up for the patient portal called "MyChart".  Sign up information is provided on this After Visit  Summary.  MyChart is used to connect with patients for Virtual Visits (Telemedicine).  Patients are able to view lab/test results, encounter notes, upcoming appointments, etc.  Non-urgent messages can be sent to your provider as well.   To learn more about what you can do with MyChart, go to [email or web address].    Your next appointment:   6 month(s)  Provider:   Peter Swaziland, MD     Other Instructions

## 2023-03-11 NOTE — Progress Notes (Signed)
Office Visit    Patient Name: Keith Franklin Date of Encounter: 03/11/2023  Primary Care Provider:  Allwardt, Crist Infante, PA-C Primary Cardiologist:  Peter Swaziland, MD  Chief Complaint    62 year old male with a history of CAD s/p CABG x 3 (LIMA-LAD, SVG-D1 and SVG-OM) in 09/2020, PEA/cardiac arrest at the time of surgery, hypertension, hyperlipidemia, OSA, and GERD who presents for follow-up related to CAD.    Past Medical History    Past Medical History:  Diagnosis Date   Cancer (HCC) 2010   skin cancer, Basal Cell Carcinoma    COVID-19 07/2019   GERD (gastroesophageal reflux disease)    History of kidney stones    Myocardial infarction (HCC) 09/30/2020   Past Surgical History:  Procedure Laterality Date   CORONARY ARTERY BYPASS GRAFT N/A 10/03/2020   Procedure: CORONARY ARTERY BYPASS GRAFTING (CABG) X THREE, ON PUMP, USING LEFT INTERNAL MAMMARY ARTERY AND RIGHT GREATER SAPHENOUS ENDOSCOPIC VEIN HARVEST CONDUITS;  Surgeon: Loreli Slot, MD;  Location: [facility] OR;  Service: Open Heart Surgery;  Laterality: N/A;  will harvest left radial artery for conduit Swan   CORONARY/GRAFT ACUTE MI REVASCULARIZATION N/A 09/30/2020   Procedure: Coronary/Graft Acute MI Revascularization;  Surgeon: Swaziland, Peter M, MD;  Location: [facility] INVASIVE CV LAB;  Service: Cardiovascular;  Laterality: N/A;   CYSTOSCOPY WITH URETHRAL DILATATION N/A 04/10/2021   Procedure: CYSTOSCOPY WITH OPTILUME URETHRAL DILATATION;  Surgeon: Crista Elliot, MD;  Location: [facility] ORS;  Service: Urology;  Laterality: N/A;   LEFT HEART CATH AND CORONARY ANGIOGRAPHY N/A 09/30/2020   Procedure: LEFT HEART CATH AND CORONARY ANGIOGRAPHY;  Surgeon: Swaziland, Peter M, MD;  Location: [facility] INVASIVE CV LAB;  Service: Cardiovascular;  Laterality: N/A;   skin cancer removal     TONSILLECTOMY     WISDOM TOOTH EXTRACTION      Allergies  No Known Allergies   Labs/Other Studies Reviewed    The following studies were reviewed  today:  Cardiac Studies & Procedures   CARDIAC CATHETERIZATION  CARDIAC CATHETERIZATION 09/30/2020  Narrative  Prox LAD to Mid LAD lesion is 90% stenosed.  Mid LAD lesion is 90% stenosed.  1st Diag lesion is 40% stenosed.  Mid Cx to Dist Cx lesion is 100% stenosed.  RV Branch lesion is 90% stenosed.  2nd RPL lesion is 70% stenosed.  The left ventricular systolic function is normal.  LV end diastolic pressure is normal.  The left ventricular ejection fraction is 50-55% by visual estimate.  Post intervention, there is a 40% residual stenosis.  Balloon angioplasty was performed using a BALLOON SAPPHIRE 2.5X12.  1. Severe 2 vessel obstructive CAD. There is severe diffuse segmental disease in the proximal to mid LAD 90% 100% occlusion of the LCx distally with thrombus 2. EF 50-55% with mid inferior HK 3. Normal LV EDP 4. Successful PCI of the distal LCx with POBA.  Plan: will resume IV heparin in 8 hours. Continue IV Aggrastat. Initiate beta blocker and high dose statin. Continue IV Ntg. Will consult CT surgery for CABG this admission given severe LAD disease. The LAD and OM 2 and 3 appear to be acceptable targets.  Findings Coronary Findings Diagnostic  Dominance: Right  Left Anterior Descending Prox LAD to Mid LAD lesion is 90% stenosed. The lesion is segmental. Mid LAD lesion is 90% stenosed. The lesion is segmental.  First Diagonal Branch 1st Diag lesion is 40% stenosed.  Left Circumflex Mid Cx to Dist Cx lesion is 100% stenosed. The lesion is thrombotic.  First Obtuse Marginal Branch Vessel is small in size.  Right Coronary Artery  Right Ventricular Branch RV Branch lesion is 90% stenosed.  Second Right Posterolateral Branch 2nd RPL lesion is 70% stenosed.  Intervention  Mid Cx to Dist Cx lesion Angioplasty CATH LAUNCHER 6FR EBU3.5 guide catheter was inserted. WIRE ASAHI PROWATER 180CM guidewire used to cross lesion. Balloon angioplasty was performed  using a BALLOON SAPPHIRE 2.5X12. Maximum pressure: 8 atm. Post-Intervention Lesion Assessment The intervention was successful. Pre-interventional TIMI flow is 0. Post-intervention TIMI flow is 3. No complications occurred at this lesion. There is a 40% residual stenosis post intervention.     ECHOCARDIOGRAM  ECHOCARDIOGRAM COMPLETE 04/16/2022  Narrative ECHOCARDIOGRAM REPORT    Patient Name:   Keith Franklin Date of Exam: 04/16/2022 Medical Rec #:  [id number]       Height:       68.0 in Accession #:    [id number]      Weight:       253.0 lb Date of Birth:  [birth year]      BSA:          2.258 m Patient Age:    61 years        BP:           128/75 mmHg Patient Gender: M               HR:           66 bpm. 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 Cardiomyopathy, CAD and Previous Myocardial Infarction, Prior CABG, Signs/Symptoms:Chest Pain; Risk Factors:Family History of Coronary Artery Disease, Hypertension, Dyslipidemia and Sleep Apnea. Obesity, PEA Arrest.  Sonographer:    Farrel Conners RDCS Referring Phys: Teyona Nichelson C Dianna Ewald  IMPRESSIONS   1. Difficult apical windows. Endocardium is difficult to see well. The mid/distal anterior distal lateral and apical walls appear hypokinetic Consider limited echo with Definity to confirm wall motion and LVEF IN comparison there does not appear to be signifcant change in LVEF from echo in 2022. Left ventricular ejection fraction, by estimation, is 40 to 45%. The left ventricle has mildly decreased function. There is mild left ventricular hypertrophy. Left ventricular diastolic parameters were normal. 2. Right ventricular systolic function is normal. The right ventricular size is normal. There is normal pulmonary artery systolic  pressure. 3. Left atrial size was mildly dilated. 4. Right atrial size was mildly dilated. 5. The mitral valve is normal in structure. Mild mitral valve regurgitation. 6. The aortic valve is tricuspid. Aortic valve regurgitation is not visualized. Aortic valve sclerosis is present, with no evidence of aortic valve stenosis. 7. The inferior vena cava is normal in size with greater than 50% respiratory variability, suggesting right atrial pressure of 3 mmHg.  FINDINGS Left Ventricle: Difficult apical windows. Endocardium is difficult to see well. The mid/distal anterior distal lateral and apical walls appear hypokinetic Consider limited echo with Definity to confirm wall motion and LVEF IN comparison there does not appear to be signifcant change in LVEF from echo in 2022. Left ventricular ejection fraction, by estimation, is 40 to 45%. The left ventricle has mildly decreased function. The left ventricular internal cavity size was normal in size. There is mild left ventricular hypertrophy. Left ventricular diastolic parameters were normal.  Right Ventricle: The  right ventricular size is normal. Right vetricular wall thickness was not assessed. Right ventricular systolic function is normal. There is normal pulmonary artery systolic pressure. The tricuspid regurgitant velocity is 1.90 m/s, and with an assumed right atrial pressure of 3 mmHg, the estimated right ventricular systolic pressure is 17.4 mmHg.  Left Atrium: Left atrial size was mildly dilated.  Right Atrium: Right atrial size was mildly dilated.  Pericardium: Trivial pericardial effusion is present.  Mitral Valve: The mitral valve is normal in structure. Mild mitral valve regurgitation.  Tricuspid Valve: The tricuspid valve is normal in structure. Tricuspid valve regurgitation is mild.  Aortic Valve: The aortic valve is tricuspid. Aortic valve regurgitation is not visualized. Aortic valve sclerosis is present, with no evidence of  aortic valve stenosis.  Pulmonic Valve: The pulmonic valve was normal in structure. Pulmonic valve regurgitation is not visualized.  Aorta: The aortic root and ascending aorta are structurally normal, with no evidence of dilitation.  Venous: The inferior vena cava is normal in size with greater than 50% respiratory variability, suggesting right atrial pressure of 3 mmHg.  IAS/Shunts: No atrial level shunt detected by color flow Doppler.   LEFT VENTRICLE PLAX 2D LVIDd:         5.40 cm   Diastology LVIDs:         3.80 cm   LV e' medial:    6.20 cm/s LV PW:         1.00 cm   LV E/e' medial:  11.5 LV IVS:        1.30 cm   LV e' lateral:   11.80 cm/s LVOT diam:     2.40 cm   LV E/e' lateral: 6.0 LV SV:         96 LV SV Index:   43        2D Longitudinal Strain LVOT Area:     4.52 cm  2D Strain GLS (A2C):   -22.2 % 2D Strain GLS (A3C):   -20.2 % 2D Strain GLS (A4C):   -20.5 % 2D Strain GLS Avg:     -21.0 %  3D Volume EF: 3D EF:        55 % LV EDV:       205 ml LV ESV:       92 ml LV SV:        114 ml  RIGHT VENTRICLE RV Basal diam:  4.50 cm RV Mid diam:    3.80 cm RV S prime:     12.90 cm/s TAPSE (M-mode): 2.0 cm  LEFT ATRIUM             Index        RIGHT ATRIUM           Index LA diam:        5.30 cm 2.35 cm/m   RA Area:     25.00 cm LA Vol (A2C):   61.7 ml 27.32 ml/m  RA Volume:   82.40 ml  36.48 ml/m LA Vol (A4C):   98.7 ml 43.70 ml/m LA Biplane Vol: 82.4 ml 36.48 ml/m AORTIC VALVE LVOT Vmax:   96.25 cm/s LVOT Vmean:  62.600 cm/s LVOT VTI:    0.213 m  AORTA Ao Root diam: 3.80 cm Ao Asc diam:  4.10 cm  MITRAL VALVE               TRICUSPID VALVE MV Area (PHT): cm         TR Peak grad:  14.4 mmHg MV Decel Time: 236 msec    TR Vmax:        190.00 cm/s MR Peak grad: 113.2 mmHg MR Mean grad: 79.0 mmHg    SHUNTS MR Vmax:      532.00 cm/s  Systemic VTI:  0.21 m MR Vmean:     427.0 cm/s   Systemic Diam: 2.40 cm MV E velocity: 71.10 cm/s MV A velocity: 71.95  cm/s MV E/A ratio:  0.99  Dietrich Pates MD Electronically signed by Dietrich Pates MD Signature Date/Time: 04/16/2022/6:17:39 PM    Final   TEE  ECHO INTRAOPERATIVE TEE 10/03/2020  Narrative *INTRAOPERATIVE TRANSESOPHAGEAL REPORT *    Patient Name:   Keith Franklin Date of Exam: 10/03/2020 Medical Rec #:  [id number]       Height:       68.0 in Accession #:    [id number]      Weight:       254.9 lb Date of Birth:  1961/02/12      BSA:          2.27 m Patient Age:    59 years        BP:           120/78 mmHg Patient Gender: M               HR:           112 bpm. Exam Location:  Inpatient  Transesophogeal exam was perform intraoperatively during surgical procedure. Patient was closely monitored under general anesthesia during the entirety of examination.  Indications:     CABG Sonographer:     Ross Ludwig RDCS (AE) Performing Phys: Karna Christmas MD Diagnosing Phys: Karna Christmas MD  Complications: No known complications during this procedure. POST-OP IMPRESSIONS - Left Ventricle: The left ventricle is unchanged from pre-bypass. - Right Ventricle: The right ventricle appears unchanged from pre-bypass. - Aorta: The aorta appears unchanged from pre-bypass. - Left Atrial Appendage: The left atrial appendage appears unchanged from pre-bypass. - Aortic Valve: The aortic valve appears unchanged from pre-bypass. - Mitral Valve: The mitral valve appears unchanged from pre-bypass. - Tricuspid Valve: There is trivial regurgitation. - Pulmonic Valve: The pulmonic valve appears unchanged from pre-bypass. - Interatrial Septum: The interatrial septum appears unchanged from pre-bypass. - Pericardium: The pericardium appears unchanged from pre-bypass.  PRE-OP FINDINGS Left Ventricle: The left ventricle has normal systolic function, with an ejection fraction of 55-60%. The cavity size was normal. There is no increase in left ventricular wall thickness. There is no left ventricular  hypertrophy.  Right Ventricle: The right ventricle has normal systolic function. The cavity was normal. There is no increase in right ventricular wall thickness.  Left Atrium: Left atrial size was normal in size. No left atrial/left atrial appendage thrombus was detected.  Right Atrium: Right atrial size was normal in size.  Interatrial Septum: No atrial level shunt detected by color flow Doppler.  Pericardium: There is no evidence of pericardial effusion.  Mitral Valve: The mitral valve is normal in structure. Mitral valve regurgitation is trivial by color flow Doppler.  Tricuspid Valve: The tricuspid valve was normal in structure. Tricuspid valve regurgitation was not visualized by color flow Doppler.  Aortic Valve: The aortic valve is normal in structure. Aortic valve regurgitation was not visualized by color flow Doppler.  Pulmonic Valve: The pulmonic valve was normal in structure. Pulmonic valve regurgitation is not visualized by color flow Doppler.   Aorta: The aortic root, ascending aorta and aortic  arch are normal in size and structure.  +--------------+--------++ LEFT VENTRICLE         +--------------+--------++ PLAX 2D                +--------------+--------++ LVIDd:        6.20 cm  +--------------+--------++ LVIDs:        4.30 cm  +--------------+--------++ LVOT diam:    2.10 cm  +--------------+--------++ LV SV:        111 ml   +--------------+--------++ LV SV Index:  46.10    +--------------+--------++ LVOT Area:    3.46 cm +--------------+--------++                        +--------------+--------++   +-------------+-------++ AORTA                +-------------+-------++ Ao Root diam:4.00 cm +-------------+-------++   +--------------+-------+ SHUNTS                +--------------+-------+ Systemic Diam:2.10 cm +--------------+-------+   Karna Christmas MD Electronically signed by Karna Christmas  MD Signature Date/Time: 10/03/2020/4:55:50 PM    Final           Recent Labs: 08/21/2022: BUN 18; Creatinine, Ser 1.00; Hemoglobin 15.6; Platelets 239; Potassium 4.6; Sodium 140; TSH 2.280 11/20/2022: ALT 20  Recent Lipid Panel    Component Value Date/Time   CHOL 102 11/20/2022 0838   TRIG 75 11/20/2022 0838   HDL 44 11/20/2022 0838   CHOLHDL 2.3 11/20/2022 0838   CHOLHDL 2 01/18/2022 0912   VLDL 7.4 01/18/2022 0912   LDLCALC 42 11/20/2022 0838   LDLCALC 32 01/13/2021 1516    History of Present Illness    62 year old male with the above past medical history including CAD s/p CABG x 3 (LIMA-LAD, SVG-D1 and SVG-OM) in 09/2020, PEA/cardiac arrest at the time of surgery, hypertension, hyperlipidemia, OSA, and GERD.   He was hospitalized in June 2022 in the setting of NSTEMI.  Emergent cardiac catheterization revealed multivessel CAD.  He underwent CABG x 3 on 10/03/2020.  While his lines were being placed and he was given sedation he had a bradycardic event with PEA and cardiac arrest with ROSC following less than 2 minutes of CPR.  Echocardiogram in October 2022 showed EF 40 to 45% mildly decreased LV function, LV global hypokinesis, G2 DD, mildly reduced RV systolic function, moderately enlarged RV, moderately dilated RA, no significant valvular abnormalities, mild dilation of ascending aorta, 41 mm.  He has a history of palpitations, managed on metoprolol.  He presented to the ED on 03/13/2022 with nonexertional chest pressure.  Troponin was negative x 2, EKG was unchanged from prior (T wave inversion in aVL), chest x-ray was unremarkable.  His symptoms resolved and he was discharged home in stable condition and advised to follow-up with cardiology as an outpatient.  Repeat echocardiogram in 03/2022 showed EF 40 to 45%, mildly decreased LV function, normal RV systolic function, mild mitral valve regurgitation, no evidence of aortic dilation.  He was last seen in the office on 11/22/2022 and was  stable from a cardiac standpoint.  He denied any symptoms concerning for angina though he did note intermittent palpitations.  He was advised to take an additional 25 mg of metoprolol tartrate as needed for palpitations.  He presents today for follow-up.  Since his last visit he has been stable from a cardiac standpoint. He does note some generalized fatigue, decreased exercise tolerance, arthralgias and myalgias.  He denies chest  pain, palpitations, dyspnea, edema, PND, orthopnea, weight gain.   Home Medications    Current Outpatient Medications  Medication Sig Dispense Refill   acetaminophen (TYLENOL) 500 MG tablet Take 1,000 mg by mouth every 6 (six) hours as needed for moderate pain.     aspirin EC 81 MG EC tablet Take 1 tablet (81 mg total) by mouth daily. Swallow whole.     diphenhydramine-acetaminophen (TYLENOL PM) 25-500 MG TABS tablet Take 1 tablet by mouth at bedtime.     metoprolol succinate (TOPROL-XL) 25 MG 24 hr tablet Take 1.5 tablets (37.5 mg total) by mouth daily. (Patient taking differently: Take 25 mg by mouth daily.) 135 tablet 0   rosuvastatin (CRESTOR) 20 MG tablet Take 1 tablet (20 mg total) by mouth daily. 90 tablet 3   tamsulosin (FLOMAX) 0.4 MG CAPS capsule Take 0.4 mg by mouth daily.     nitroGLYCERIN (NITROSTAT) 0.4 MG SL tablet Place 1 tablet (0.4 mg total) under the tongue every 5 (five) minutes as needed for chest pain. (Patient not taking: Reported on 03/11/2023) 25 tablet 3   Zoster Vaccine Adjuvanted [facility]) injection Inject into the muscle. (Patient not taking: Reported on 11/22/2022) 0.5 mL 1   No current facility-administered medications for this visit.     Review of Systems    He denies chest pain, palpitations, dyspnea, pnd, orthopnea, n, v, dizziness, syncope, edema, weight gain, or early satiety. All other systems reviewed and are otherwise negative except as noted above.   Physical Exam    VS:  BP 124/86 (BP Location: Left Arm, Patient Position:  Sitting, Cuff Size: Large)   Pulse 75   Ht 5\' 8"  (1.727 m)   Wt 256 lb 3.2 oz (116.2 kg)   SpO2 97%   BMI 38.96 kg/m  GEN: Well nourished, well developed, in no acute distress. HEENT: normal. Neck: Supple, no JVD, carotid bruits, or masses. Cardiac: RRR, no murmurs, rubs, or gallops. No clubbing, cyanosis, edema.  Radials/DP/PT 2+ and equal bilaterally.  Respiratory:  Respirations regular and unlabored, clear to auscultation bilaterally. GI: Soft, nontender, nondistended, BS + x 4. MS: no deformity or atrophy. Skin: warm and dry, no rash. Neuro:  Strength and sensation are intact. Psych: Normal affect.  Accessory Clinical Findings    ECG personally reviewed by me today -    - no EKG in office today.    Lab Results  Component Value Date   WBC 6.9 08/21/2022   HGB 15.6 08/21/2022   HCT 46.7 08/21/2022   MCV 95 08/21/2022   PLT 239 08/21/2022   Lab Results  Component Value Date   CREATININE 1.00 08/21/2022   BUN 18 08/21/2022   NA 140 08/21/2022   K 4.6 08/21/2022   CL 104 08/21/2022   CO2 21 08/21/2022   Lab Results  Component Value Date   ALT 20 11/20/2022   AST 25 11/20/2022   ALKPHOS 57 11/20/2022   BILITOT 0.7 11/20/2022   Lab Results  Component Value Date   CHOL 102 11/20/2022   HDL 44 11/20/2022   LDLCALC 42 11/20/2022   TRIG 75 11/20/2022   CHOLHDL 2.3 11/20/2022    Lab Results  Component Value Date   HGBA1C 5.9 11/20/2022    Assessment & Plan    1. CAD: S/p CABG x 3 in 09/2020.  He denies chest pain though he does note mildly decreased activity tolerance, generalized fatigue.  Repeat echo pending as below, will trial statin holiday as below.  If symptoms  persist and echocardiogram unrevealing, consider need for ischemic evaluation.  Continue to monitor symptoms. Continue aspirin, metoprolol as below, Crestor as below.  2. ICM: EF was 40 to 45% in 01/2021. Repeat echo in 03/2022 showed EF 40 to 45%, mildly decreased LV function, normal RV systolic  function, mild mitral valve regurgitation, no evidence of aortic dilation. Euvolemic and well compensated on exam.  He does note some decreased activity tolerance, generalized fatigue.  Will decrease metoprolol to 25 mg daily.  Will update echocardiogram.  Further escalation of GDMT limited in the setting of borderline BP.  Continue metoprolol as above.   3. Hypertension: BP well controlled, history of significant hypotension with ACEi. Continue metoprolol as above.   4. Hyperlipidemia: LDL was 42 in 10/2022.  He recently transitioned from Lipitor to Crestor.  He continues to note generalized fatigue, myalgias and arthralgias.  Recommend 2-week statin holiday.  If symptoms improve, will refer to lipid clinic Pharm.D. for consideration of PCSK9 inhibitor.  If no improvement, will resume Crestor.   5. Mild dilation of ascending aorta: Measured 41 mm on echocardiogram in October 2022.  Not appreciated on most recent echo.  Repeat echo pending as above.   6. OSA: Adherent to CPAP.   7. Obesity: He has noticed weight gain despite lifestyle modifications with diet and exercise.  Unfortunately, his insurance will not cover GLP-1.  Encouraged ongoing lifestyle modifications with diet and exercise.   8. Disposition: Follow-up in 6 months with Dr. Swaziland.       Joylene Grapes, NP 03/11/2023, 12:14 PM

## 2023-03-22 ENCOUNTER — Ambulatory Visit (HOSPITAL_COMMUNITY)
Admission: RE | Admit: 2023-03-22 | Discharge: 2023-03-22 | Disposition: A | Discharge status: Home / Self Care | Payer: 59 | Source: Ambulatory Visit | Attending: Cardiovascular Disease | Admitting: Cardiovascular Disease

## 2023-03-22 DIAGNOSIS — I77810 Thoracic aortic ectasia: Secondary | ICD-10-CM | POA: Insufficient documentation

## 2023-03-22 DIAGNOSIS — I255 Ischemic cardiomyopathy: Secondary | ICD-10-CM | POA: Diagnosis present

## 2023-03-22 DIAGNOSIS — I2510 Atherosclerotic heart disease of native coronary artery without angina pectoris: Secondary | ICD-10-CM | POA: Diagnosis present

## 2023-03-22 LAB — ECHOCARDIOGRAM COMPLETE
AR max vel: 3.03 cm2
AV Area VTI: 3.02 cm2
AV Area mean vel: 2.78 cm2
AV Mean grad: 2 mm[Hg]
AV Peak grad: 3.8 mm[Hg]
Ao pk vel: 0.98 m/s
Area-P 1/2: 4.31 cm2
S' Lateral: 3.85 cm

## 2023-05-25 ENCOUNTER — Other Ambulatory Visit: Payer: Self-pay | Admitting: Cardiology

## 2023-05-27 DIAGNOSIS — M79645 Pain in left finger(s): Secondary | ICD-10-CM | POA: Insufficient documentation

## 2023-05-27 MED ORDER — METOPROLOL SUCCINATE ER 25 MG PO TB24: 37.5000 mg | ORAL_TABLET | Freq: Every day | ORAL | 3 refills | Status: DC

## 2023-07-31 ENCOUNTER — Other Ambulatory Visit: Payer: Self-pay | Admitting: Nurse Practitioner

## 2023-08-22 ENCOUNTER — Encounter (INDEPENDENT_AMBULATORY_CARE_PROVIDER_SITE_OTHER): Payer: Self-pay

## 2023-08-22 ENCOUNTER — Ambulatory Visit (INDEPENDENT_AMBULATORY_CARE_PROVIDER_SITE_OTHER)

## 2023-08-22 ENCOUNTER — Encounter: Payer: Self-pay | Admitting: Physician Assistant

## 2023-08-22 ENCOUNTER — Ambulatory Visit (INDEPENDENT_AMBULATORY_CARE_PROVIDER_SITE_OTHER): Payer: PRIVATE HEALTH INSURANCE | Admitting: Physician Assistant

## 2023-08-22 VITALS — BP 134/78 | HR 60 | Temp 97.9°F | Ht 66.54 in | Wt 258.6 lb

## 2023-08-22 DIAGNOSIS — Z23 Encounter for immunization: Secondary | ICD-10-CM | POA: Diagnosis not present

## 2023-08-22 DIAGNOSIS — Z0000 Encounter for general adult medical examination without abnormal findings: Secondary | ICD-10-CM | POA: Diagnosis not present

## 2023-08-22 DIAGNOSIS — Z125 Encounter for screening for malignant neoplasm of prostate: Secondary | ICD-10-CM

## 2023-08-22 DIAGNOSIS — R5383 Other fatigue: Secondary | ICD-10-CM

## 2023-08-22 DIAGNOSIS — M79672 Pain in left foot: Secondary | ICD-10-CM

## 2023-08-22 DIAGNOSIS — Z1159 Encounter for screening for other viral diseases: Secondary | ICD-10-CM

## 2023-08-22 DIAGNOSIS — Z8589 Personal history of malignant neoplasm of other organs and systems: Secondary | ICD-10-CM

## 2023-08-22 DIAGNOSIS — M791 Myalgia, unspecified site: Secondary | ICD-10-CM

## 2023-08-22 LAB — COMPREHENSIVE METABOLIC PANEL WITH GFR
ALT: 18 U/L (ref 0–53)
AST: 20 U/L (ref 0–37)
Albumin: 4.5 g/dL (ref 3.5–5.2)
Alkaline Phosphatase: 50 U/L (ref 39–117)
BUN: 16 mg/dL (ref 6–23)
CO2: 25 meq/L (ref 19–32)
Calcium: 9.5 mg/dL (ref 8.4–10.5)
Chloride: 104 meq/L (ref 96–112)
Creatinine, Ser: 0.99 mg/dL (ref 0.40–1.50)
GFR: 81.13 mL/min (ref >60.00)
Glucose, Bld: 96 mg/dL (ref 70–99)
Potassium: 4.2 meq/L (ref 3.5–5.1)
Sodium: 138 meq/L (ref 135–145)
Total Bilirubin: 0.8 mg/dL (ref 0.2–1.2)
Total Protein: 6.8 g/dL (ref 6.0–8.3)

## 2023-08-22 LAB — TSH: TSH: 2.59 u[IU]/mL (ref 0.35–5.50)

## 2023-08-22 LAB — CBC WITH DIFFERENTIAL/PLATELET
Basophils Absolute: 0.1 10*3/uL (ref 0.0–0.1)
Basophils Relative: 0.7 % (ref 0.0–3.0)
Eosinophils Absolute: 0 10*3/uL (ref 0.0–0.7)
Eosinophils Relative: 0.7 % (ref 0.0–5.0)
HCT: 47.1 % (ref 39.0–52.0)
Hemoglobin: 15.9 g/dL (ref 13.0–17.0)
Lymphocytes Relative: 28.4 % (ref 12.0–46.0)
Lymphs Abs: 2.1 10*3/uL (ref 0.7–4.0)
MCHC: 33.8 g/dL (ref 30.0–36.0)
MCV: 94.8 fl (ref 78.0–100.0)
Monocytes Absolute: 0.7 10*3/uL (ref 0.1–1.0)
Monocytes Relative: 9.9 % (ref 3.0–12.0)
Neutro Abs: 4.5 10*3/uL (ref 1.4–7.7)
Neutrophils Relative %: 60.3 % (ref 43.0–77.0)
Platelets: 228 10*3/uL (ref 150.0–400.0)
RBC: 4.96 Mil/uL (ref 4.22–5.81)
RDW: 13.3 % (ref 11.5–15.5)
WBC: 7.5 10*3/uL (ref 4.0–10.5)

## 2023-08-22 LAB — LIPID PANEL
Cholesterol: 108 mg/dL (ref 0–200)
HDL: 42.8 mg/dL (ref >39.00)
LDL Cholesterol: 52 mg/dL (ref 0–99)
NonHDL: 65.44
Total CHOL/HDL Ratio: 3
Triglycerides: 65 mg/dL (ref 0.0–149.0)
VLDL: 13 mg/dL (ref 0.0–40.0)

## 2023-08-22 LAB — VITAMIN B12: Vitamin B-12: 348 pg/mL (ref 211–911)

## 2023-08-22 LAB — PSA: PSA: 0.55 ng/mL (ref 0.10–4.00)

## 2023-08-22 LAB — CK: Total CK: 263 U/L — ABNORMAL HIGH (ref 7–232)

## 2023-08-22 LAB — HEMOGLOBIN A1C: Hgb A1c MFr Bld: 5.9 % (ref 4.6–6.5)

## 2023-08-22 LAB — SEDIMENTATION RATE: Sed Rate: 5 mm/h (ref 0–20)

## 2023-08-22 NOTE — Progress Notes (Signed)
 Patient ID: Keith Franklin, male    DOB: October 13, 1960, 63 y.o.   MRN: [id number]   Assessment & Plan:  Annual physical exam -     CBC with Differential/Platelet; Future -     Comprehensive metabolic panel with GFR; Future -     Hemoglobin A1c; Future -     Lipid panel; Future -     PSA; Future -     TSH; Future -     Vitamin B12; Future  Generalized muscle ache -     CK; Future -     Sedimentation rate; Future  Other fatigue -     Vitamin B12; Future -     CK; Future -     Sedimentation rate; Future  Need for hepatitis C screening test -     Hepatitis C antibody; Future  Need for pneumococcal vaccine -     Pneumococcal conjugate vaccine 20-valent  Obesity, morbid, BMI 40.0-49.9 (HCC) -     Amb Ref to Medical Weight Management  Acute foot pain, left -     DG Foot Complete Left; Future  History of squamous cell carcinoma     Assessment & Plan Wellness Visit Annual wellness visit with reported difficulty in exercise duration and weight loss. Potential medication-related fatigue and muscle aches considered. No acute illness. Referral to [facility] for comprehensive nutrition and medication management is being considered. 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 Previous high CK levels. Currently on Crestor  20 mg. Discussed potential switch to Repatha, an injectable alternative for statin intolerance, pending insurance coverage. - Check CK level to assess for muscle breakdown - Discuss potential switch to Repatha with cardiologist  Foot contusion Recent trauma to L foot from dropping a headboard, resulting in bruising and tenderness. Concerns for potential fracture. Continues to exercise despite injury. - Order foot x-ray to rule out  fracture - Advise rest, elevation, and icing of the foot  Squamous cell carcinoma Recent treatment by dermatologist for squamous cell carcinoma. No current concerns. Recent treatment involved freezing of a lesion suspected to be squamous cell carcinoma.  General Health Maintenance Up to date on colon cancer screening with Cologuard in 2023. Discussed pneumonia vaccination due to recent increase in pneumonia cases. - Administer pneumonia vaccine      Return in about 5 months (around 01/22/2024) for recheck/follow-up.    Subjective:    Chief Complaint  Patient presents with   Annual Exam    Pt in office for annual CPE and fasting labs; pt is wanting to look at left foot, blood pulling/bruising in toes after dropping headboard to iron bed on Friday; Tender and sore to touch; doesn't feel anything is broken but wanting to be looked at    HPI Discussed the use of AI scribe software for clinical note transcription with the patient, who gave verbal consent to proceed.  History of Present Illness Keith Franklin "Keith Franklin" is a 63 year old male who presents with exercise intolerance and muscle aches.  He experiences exercise intolerance, particularly struggling with sustaining duration rather than due to fatigue, leading to a reduction in treadmill time. He has attempted to cut calories for weight loss but felt 'starving' and experienced a rebound in weight.  He experiences unusual heartbeats, identified as PVCs, which were managed  by temporarily increasing his metoprolol  dosage. He has since returned to a 25 mg dose since January and has been stable.  He has muscle aches, which he associates with his statin use. Initially on 80 mg, his dose was reduced to 40 mg, and then switched to 20 mg of Crestor . Despite these changes, muscle aches and tiredness persist, particularly after workouts.  He recently dropped an iron headboard on his foot, resulting in bruising and tenderness. He continues  to exercise, noting some sensitivity on the treadmill but not with other exercises like the Arc Trainer and NuStep.  He has a history of skin issues, including a recent treatment for a suspected squamous cell carcinoma on his scalp, which was frozen by his dermatologist.     Past Medical History:  Diagnosis Date   Cancer (HCC) 2010   skin cancer, Basal Cell Carcinoma    COVID-19 07/2019   GERD (gastroesophageal reflux disease)    History of kidney stones    Myocardial infarction (HCC) 09/30/2020   Sleep apnea 10/05/2020   Using CPap has eliminated Apnea    Past Surgical History:  Procedure Laterality Date   CORONARY ARTERY BYPASS GRAFT N/A 10/03/2020   Procedure: CORONARY ARTERY BYPASS GRAFTING (CABG) X THREE, ON PUMP, USING LEFT INTERNAL MAMMARY ARTERY AND RIGHT GREATER SAPHENOUS ENDOSCOPIC VEIN HARVEST CONDUITS;  Surgeon: Zelphia Higashi, MD;  Location: [facility] OR;  Service: Open Heart Surgery;  Laterality: N/A;  will harvest left radial artery for conduit Swan   CORONARY/GRAFT ACUTE MI REVASCULARIZATION N/A 09/30/2020   Procedure: Coronary/Graft Acute MI Revascularization;  Surgeon: [place]il[place], Peter M, MD;  Location: [facility] INVASIVE CV LAB;  Service: Cardiovascular;  Laterality: N/A;   CYSTOSCOPY WITH URETHRAL DILATATION N/A 04/10/2021   Procedure: CYSTOSCOPY WITH OPTILUME URETHRAL DILATATION;  Surgeon: Samson Croak, MD;  Location: [facility] ORS;  Service: Urology;  Laterality: N/A;   LEFT HEART CATH AND CORONARY ANGIOGRAPHY N/A 09/30/2020   Procedure: LEFT HEART CATH AND CORONARY ANGIOGRAPHY;  Surgeon: [place], Peter M, MD;  Location: [facility] INVASIVE CV LAB;  Service: Cardiovascular;  Laterality: N/A;   skin cancer removal     TONSILLECTOMY     WISDOM TOOTH EXTRACTION      Family History  Problem Relation Age of Onset   Cancer Mother    Heart attack Father    Heart disease Father    Sleep apnea Neg Hx    Colon cancer Neg Hx     Social History   Tobacco Use   Smoking status: Never    Smokeless tobacco: Never  Vaping Use   Vaping status: Never Used  Substance Use Topics   Alcohol use: Not Currently    Comment: rare   Drug use: Never     No Known Allergies  Review of Systems NEGATIVE UNLESS OTHERWISE INDICATED IN HPI      Objective:     BP 134/78 (BP Location: Left Arm, Patient Position: Sitting, Cuff Size: Large)   Pulse 60   Temp 97.9 F (36.6 C) (Temporal)   Ht 5' 6.54" (1.69 m)   Wt 258 lb 9.6 oz (117.3 kg)   SpO2 97%   BMI 41.07 kg/m   Wt Readings from Last 3 Encounters:  08/22/23 258 lb 9.6 oz (117.3 kg)  03/11/23 256 lb 3.2 oz (116.2 kg)  11/22/22 256 lb 3.2 oz (116.2 kg)    BP Readings from Last 3 Encounters:  08/22/23 134/78  03/11/23 124/86  11/22/22 130/84     Physical  Exam Vitals and nursing note reviewed.  Constitutional:      General: He is not in acute distress.    Appearance: Normal appearance. He is obese. He is not toxic-appearing.  HENT:     Head: Normocephalic and atraumatic.     Right Ear: Tympanic membrane, ear canal and external ear normal.     Left Ear: Tympanic membrane, ear canal and external ear normal.     Nose: Nose normal.     Mouth/Throat:     Mouth: Mucous membranes are moist.     Pharynx: Oropharynx is clear.  Eyes:     Extraocular Movements: Extraocular movements intact.     Conjunctiva/sclera: Conjunctivae normal.     Pupils: Pupils are equal, round, and reactive to light.  Cardiovascular:     Rate and Rhythm: Normal rate and regular rhythm.     Pulses: Normal pulses.     Heart sounds: Normal heart sounds.  Pulmonary:     Effort: Pulmonary effort is normal.     Breath sounds: Normal breath sounds.  Abdominal:     General: Abdomen is flat. Bowel sounds are normal.     Palpations: Abdomen is soft.     Tenderness: There is no abdominal tenderness.  Musculoskeletal:        General: Normal range of motion.     Cervical back: Normal range of motion and neck supple.  Skin:    General: Skin is  warm and dry.     Findings: Bruising (L mid-foot swelling and bruising; n/v intact) and lesion (red lesions on L side of face - c/w dermatologist treatment per pt) present.  Neurological:     General: No focal deficit present.     Mental Status: He is alert and oriented to person, place, and time.  Psychiatric:        Mood and Affect: Mood normal.        Behavior: Behavior normal.             Solveig Fangman M Monicia Tse, PA-C

## 2023-08-23 ENCOUNTER — Encounter: Payer: Self-pay | Admitting: Physician Assistant

## 2023-08-23 LAB — HEPATITIS C ANTIBODY: Hepatitis C Ab: NONREACTIVE

## 2023-09-07 NOTE — Progress Notes (Unsigned)
 f    Cardiology Office Note   Date:  09/11/2023   ID:  Keith Franklin, DOB 01-21-1961, MRN [id number]  PCP:  Alda Amas, PA-C  Cardiologist:   Shlomie Romig [place], MD   No chief complaint on file.     History of Present Illness: Keith Franklin is a 63 y.o. male who presents for follow up CAD. He presented to [facility] in June 2022 with chest pain and was ruled in for ST elevated myocardial infarction.  He was taken emergently to the cardiac Cath Lab which showed a totally occluded circumflex.  We were able to reestablish flow in  the circumflex with POBA.  However, he was also noted to have severe LAD diagonal disease with moderate residual circumflex disease.  CABG was recommended.   He underwent CABG x3 on 10/03/2020.  While his lines were being placed and he was given sedation he had a bradycardic event with pulseless electrical activity cardiac arrest.  CPR was initiated.  It was performed for less than 2 minutes.  He had return of spontaneous circulation and was taken emergently to the operating room.    He received a LIMA-LAD, SVG to first diagonal and third obtuse marginal.   He was noted to have occasional monitor desaturations at night.  He was felt to potentially have OSA.  He continued to recover well.  He was discharged home in stable condition with his wife who is a retired Charity fundraiser his daughter who is a Engineer, site on 10/07/2020.   He was seen in follow up in June and September.  Repeat Echo in October 2022 showed EF 40-45%. Progressing well. Noted insomnia. Sleep study planned. He was  seen by urology for hematuria and underwent cystoscopy and urethral dilation in December.   He has home sleep study showed moderate OSA. Recommended auto PAP titration at home. Now followed by Dr Omar Bibber with good compliance noted on follow up in March.   He did report some recent palpitations due to PVCs. Reviewed tracings on Apple Watch. We recommended increasing his Toprol  XL.   He  presented to the ED on 03/13/2022 with nonexertional chest pressure.  Troponin was negative x 2, EKG was unchanged from prior (T wave inversion in aVL), chest x-ray was unremarkable.  His symptoms resolved and he was discharged home in stable condition and advised to follow-up with cardiology as an outpatient.  Repeat echocardiogram in 03/2022 showed EF 40 to 45%, mildly decreased LV function, normal RV systolic function, mild mitral valve regurgitation, no evidence of aortic dilation.  He was last seen in the office on 11/22/2022 and was stable from a cardiac standpoint.  He denied any symptoms concerning for angina though he did note intermittent palpitations.  He was advised to take an additional 25 mg of metoprolol  tartrate as needed for palpitations. He was last seen in Nov 2024 and doing well. Echo done and reported EF was lower but this was felt to be unchanged from priors.   He is working out regularly at [employer] 5 days a week doing 45 minutes of aerobics and 40 minutes strength training. Denies any chest pain or SOB. He has gained some weight and is [facility].  He did develop symptoms of muscle fatigue and weakness. It was a struggle to finish his work outs. Was seen by PCP and labs showed elevated CK so Crestor  was stopped. He had similar symptoms on lipitor  before.   Past Medical History:  Diagnosis Date   Cancer [facility]) 2010   skin cancer, Basal Cell Carcinoma    COVID-19 07/2019   GERD (gastroesophageal reflux disease)    History of kidney stones    Myocardial infarction (HCC) 09/30/2020   Sleep apnea 10/05/2020   Using CPap has eliminated Apnea    Past Surgical History:  Procedure Laterality Date   CORONARY ARTERY BYPASS GRAFT N/A 10/03/2020   Procedure: CORONARY ARTERY BYPASS GRAFTING (CABG) X THREE, ON PUMP, USING LEFT INTERNAL MAMMARY ARTERY AND RIGHT GREATER SAPHENOUS ENDOSCOPIC VEIN HARVEST CONDUITS;  Surgeon: Zelphia Higashi, MD;  Location:  [facility] OR;  Service: Open Heart Surgery;  Laterality: N/A;  will harvest left radial artery for conduit Swan   CORONARY/GRAFT ACUTE MI REVASCULARIZATION N/A 09/30/2020   Procedure: Coronary/Graft Acute MI Revascularization;  Surgeon: [place], Tommie Bohlken M, MD;  Location: [facility] INVASIVE CV LAB;  Service: Cardiovascular;  Laterality: N/A;   CYSTOSCOPY WITH URETHRAL DILATATION N/A 04/10/2021   Procedure: CYSTOSCOPY WITH OPTILUME URETHRAL DILATATION;  Surgeon: Samson Croak, MD;  Location: [facility] ORS;  Service: Urology;  Laterality: N/A;   LEFT HEART CATH AND CORONARY ANGIOGRAPHY N/A 09/30/2020   Procedure: LEFT HEART CATH AND CORONARY ANGIOGRAPHY;  Surgeon: [place], Teejay Meader M, MD;  Location: [facility] INVASIVE CV LAB;  Service: Cardiovascular;  Laterality: N/A;   skin cancer removal     TONSILLECTOMY     WISDOM TOOTH EXTRACTION       Current Outpatient Medications  Medication Sig Dispense Refill   acetaminophen  (TYLENOL ) 500 MG tablet Take 1,000 mg by mouth every 6 (six) hours as needed for moderate pain.     aspirin  EC 81 MG EC tablet Take 1 tablet (81 mg total) by mouth daily. Swallow whole.     metoprolol  succinate (TOPROL -XL) 25 MG 24 hr tablet Take 1.5 tablets (37.5 mg total) by mouth daily. (Patient taking differently: Take 25 mg by mouth daily.) 135 tablet 3   nitroGLYCERIN  (NITROSTAT ) 0.4 MG SL tablet Place 0.4 mg under the tongue every 5 (five) minutes as needed for chest pain.     rosuvastatin  (CRESTOR ) 20 MG tablet Take 1 tablet by mouth once daily 90 tablet 2   tamsulosin (FLOMAX) 0.4 MG CAPS capsule Take 0.4 mg by mouth daily.     diphenhydramine -acetaminophen  (TYLENOL  PM) 25-500 MG TABS tablet Take 1 tablet by mouth at bedtime. (Patient not taking: Reported on 09/11/2023)     Zoster Vaccine Adjuvanted (SHINGRIX ) injection Inject into the muscle. (Patient not taking: Reported on 09/11/2023) 0.5 mL 1   No current facility-administered medications for this visit.    Allergies:   Patient has no known  allergies.    Social History:  The patient  reports that he has never smoked. He has never used smokeless tobacco. He reports that he does not currently use alcohol. He reports that he does not use drugs.   Family History:  The patient's family history includes Cancer in his mother; Heart attack in his father; Heart disease in his father.    ROS:  Please see the history of present illness.   Otherwise, review of systems are positive for none.   All other systems are reviewed and negative.    PHYSICAL EXAM: VS:  BP 129/77 (Cuff Size: Large)   Pulse 67   Ht 5\' 7"  (1.702 m)   Wt 262 lb (118.8 kg)   SpO2 95%   BMI 41.04 kg/m  , BMI Body mass index is 41.04 kg/m. GEN: Well nourished, well developed,  in no acute distress HEENT: normal Neck: no JVD, carotid bruits, or masses Cardiac: RRR; no murmurs, rubs, or gallops,no edema  Respiratory:  clear to auscultation bilaterally, normal work of breathing Chest with median sternotomy scar with diffuse keloid GI: soft, nontender, nondistended, + BS MS: no deformity or atrophy Skin: warm and dry, no rash Neuro:  Strength and sensation are intact Psych: euthymic mood, full affect   EKG Interpretation Date/Time:  Wednesday Sep 11 2023 11:31:46 EDT Ventricular Rate:  62 PR Interval:  168 QRS Duration:  88 QT Interval:  408 QTC Calculation: 414 R Axis:   -30  Text Interpretation: Normal sinus rhythm Left axis deviation When compared with ECG of 22-Nov-2022 10:44, No significant change was found Confirmed by [place], Sarthak Rubenstein [phone]) on 09/11/2023 11:53:26 AM     Recent Labs: 08/22/2023: ALT 18; BUN 16; Creatinine, Ser 0.99; Hemoglobin 15.9; Platelets 228.0; Potassium 4.2; Sodium 138; TSH 2.59    Lipid Panel    Component Value Date/Time   CHOL 108 08/22/2023 1024   CHOL 102 11/20/2022 0838   TRIG 65.0 08/22/2023 1024   HDL 42.80 08/22/2023 1024   HDL 44 11/20/2022 0838   CHOLHDL 3 08/22/2023 1024   VLDL 13.0 08/22/2023 1024    LDLCALC 52 08/22/2023 1024   LDLCALC 42 11/20/2022 0838   LDLCALC 32 01/13/2021 1516      Wt Readings from Last 3 Encounters:  09/11/23 262 lb (118.8 kg)  08/22/23 258 lb 9.6 oz (117.3 kg)  03/11/23 256 lb 3.2 oz (116.2 kg)      Other studies Reviewed: Additional studies/ records that were reviewed today include:   Echocardiogram 10/01/2020 IMPRESSIONS     1. Left ventricular ejection fraction, by estimation, is 45 to 50%. The  left ventricle has mildly decreased function. The left ventricle has no  regional wall motion abnormalities. There is mild concentric left  ventricular hypertrophy. Left ventricular  diastolic parameters are consistent with Grade I diastolic dysfunction  (impaired relaxation). There is moderate hypokinesis of the left  ventricular, mid-apical anterior wall. There is moderate hypokinesis of  the left ventricular, entire inferolateral  wall.   2. Right ventricular systolic function is normal. The right ventricular  size is normal.   3. Left atrial size was mildly dilated.   4. The mitral valve is normal in structure. No evidence of mitral valve  regurgitation. No evidence of mitral stenosis.   5. The aortic valve is tricuspid. Aortic valve regurgitation is not  visualized. No aortic stenosis is present.   6. There is mild dilatation of the ascending aorta, measuring 42 mm.   7. The inferior vena cava is normal in size with greater than 50%  respiratory variability, suggesting right atrial pressure of 3 mmHg.      Cardiac catheterization 10/27/2020 Prox LAD to Mid LAD lesion is 90% stenosed. Mid LAD lesion is 90% stenosed. 1st Diag lesion is 40% stenosed. Mid Cx to Dist Cx lesion is 100% stenosed. RV Branch lesion is 90% stenosed. 2nd RPL lesion is 70% stenosed. The left ventricular systolic function is normal. LV end diastolic pressure is normal. The left ventricular ejection fraction is 50-55% by visual estimate. Post intervention, there is a 40%  residual stenosis. Balloon angioplasty was performed using a BALLOON SAPPHIRE 2.5X12.   1. Severe 2 vessel obstructive CAD.    There is severe diffuse segmental disease in the proximal to mid LAD 90%    100% occlusion of the LCx distally with thrombus 2. EF  50-55% with mid inferior HK 3. Normal LV EDP 4. Successful PCI of the distal LCx with POBA.   Plan: will resume IV heparin  in 8 hours. Continue IV Aggrastat . Initiate beta blocker and high dose statin. Continue IV Ntg. Will consult CT surgery for CABG this admission given severe LAD disease. The LAD and OM 2 and 3 appear to be acceptable targets.   Diagnostic Dominance: Right     Intervention         Echo 02/15/21: IMPRESSIONS     1. Left ventricular ejection fraction, by estimation, is 40 to 45%. The  left ventricle has mildly decreased function. The left ventricle  demonstrates global hypokinesis. Left ventricular diastolic parameters are  consistent with Grade II diastolic  dysfunction (pseudonormalization). The average left ventricular global  longitudinal strain is -11.6 %. The global longitudinal strain is  abnormal.   2. Right ventricular systolic function is mildly reduced. The right  ventricular size is moderately enlarged.   3. Left atrial size was mildly dilated.   4. Right atrial size was moderately dilated.   5. The mitral valve is normal in structure. Trivial mitral valve  regurgitation. No evidence of mitral stenosis.   6. The aortic valve is normal in structure. Aortic valve regurgitation is  not visualized. No aortic stenosis is present.   7. Aortic dilatation noted. There is mild dilatation of the aortic root,  measuring 40 mm. There is mild dilatation of the ascending aorta,  measuring 41 mm.   Comparison(s): EF 45-50%, mild concentric LVH, moderate hypokinesis of LV,  mid-apical anterior wall, entire inferolateral wall. Ascending aorta 42mm.   Echo 03/22/23: IMPRESSIONS     1. Very difficult  study. LVEF 35-40%, cannot determine if WMA are  present.   2. Left ventricular ejection fraction, by estimation, is 35 to 40%. The  left ventricle has moderately decreased function. Left ventricular  endocardial border not optimally defined to evaluate regional wall motion.  Left ventricular diastolic parameters  are consistent with Grade II diastolic dysfunction (pseudonormalization).   3. Right ventricular systolic function is mildly reduced. The right  ventricular size is mildly enlarged. Tricuspid regurgitation signal is  inadequate for assessing PA pressure.   4. Left atrial size was mildly dilated.   5. The mitral valve is grossly normal. Trivial mitral valve  regurgitation. No evidence of mitral stenosis.   6. The aortic valve is tricuspid. Aortic valve regurgitation is not  visualized. No aortic stenosis is present.   Comparison(s): No significant change from prior study.   ASSESSMENT AND PLAN:  1.  Coronary artery disease- - admitted with STEMI with emergent POBA of the LCx - Status post CABG x3 10/03/2020.  Had episodes of PEA arrest prior to CABG.   - asymptomatic - Continue aspirin , atorvastatin , metoprolol  -Heart healthy low-sodium diet   2. 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 Stop Crestor . Will repeat fasting lab and CK in 2 months. Arrange follow up with pharm D after to initiate alternative therapy- likely Repatha   3. Essential hypertension  Well-controlled  Continue  metoprolol  Heart healthy low-sodium diet Continue aerobic physical activity    4. OSA-moderate by in home sleep study Continue weight loss  Continue CPAP   5. Urethral stricture. S/p dilation.   6. Moderate LV dysfunction. On Toprol . Early on post CABG had low BP on lisinopril  and this was stopped. Will try low dose Entresto now and monitor BP closely.  Disposition:   FU with me in 6 months  Signed, Leasia Swann [place], MD  09/11/2023 12:06 PM     [facility] [address] [place], [place], [address] Phone [phone], Fax [phone]

## 2023-09-11 ENCOUNTER — Encounter: Payer: Self-pay | Admitting: Cardiology

## 2023-09-11 ENCOUNTER — Ambulatory Visit: Payer: 59 | Attending: Cardiology | Admitting: Cardiology

## 2023-09-11 VITALS — BP 129/77 | HR 67 | Ht 67.0 in | Wt 262.0 lb

## 2023-09-11 DIAGNOSIS — I255 Ischemic cardiomyopathy: Secondary | ICD-10-CM | POA: Diagnosis not present

## 2023-09-11 DIAGNOSIS — I10 Essential (primary) hypertension: Secondary | ICD-10-CM

## 2023-09-11 DIAGNOSIS — I2510 Atherosclerotic heart disease of native coronary artery without angina pectoris: Secondary | ICD-10-CM | POA: Diagnosis not present

## 2023-09-11 DIAGNOSIS — E785 Hyperlipidemia, unspecified: Secondary | ICD-10-CM

## 2023-09-11 MED ORDER — SACUBITRIL-VALSARTAN 24-26 MG PO TABS: 1.0000 | ORAL_TABLET | Freq: Two times a day (BID) | ORAL | 11 refills | Status: DC

## 2023-09-11 NOTE — Patient Instructions (Addendum)
 Medication Instructions:  Start Entresto 24/26 mg take twice a day Continue all other medications *If you need a refill on your cardiac medications before your next appointment, please call your pharmacy*  Lab Work: Cmet,lipid panel,cpk have done fasting 1 week before Pharm D appointment in July  Testing/Procedures: None ordered  Follow-Up: At [facility], you and your health needs are our priority.  As part of our continuing mission to provide you with exceptional heart care, our providers are all part of one team.  This team includes your primary Cardiologist (physician) and Advanced Practice Providers or APPs (Physician Assistants and Nurse Practitioners) who all work together to provide you with the care you need, when you need it.  Your next appointment:  6 months   Call in July to schedule Nov appointment     Provider:  Dr.Jordan   Schedule appointment with Pharm D in Lipid Clinic in 2 months  7/25    We recommend signing up for the patient portal called "MyChart".  Sign up information is provided on this After Visit Summary.  MyChart is used to connect with patients for Virtual Visits (Telemedicine).  Patients are able to view lab/test results, encounter notes, upcoming appointments, etc.  Non-urgent messages can be sent to your provider as well.   To learn more about what you can do with MyChart, go to [email or web address].

## 2023-09-17 ENCOUNTER — Ambulatory Visit (INDEPENDENT_AMBULATORY_CARE_PROVIDER_SITE_OTHER): Payer: PRIVATE HEALTH INSURANCE | Admitting: Adult Health

## 2023-09-17 ENCOUNTER — Encounter (INDEPENDENT_AMBULATORY_CARE_PROVIDER_SITE_OTHER): Payer: Self-pay | Admitting: Adult Health

## 2023-09-17 VITALS — BP 128/74 | HR 81 | Temp 98.4°F | Ht 67.0 in | Wt 255.0 lb

## 2023-09-17 DIAGNOSIS — R739 Hyperglycemia, unspecified: Secondary | ICD-10-CM

## 2023-09-17 DIAGNOSIS — E785 Hyperlipidemia, unspecified: Secondary | ICD-10-CM | POA: Diagnosis not present

## 2023-09-17 DIAGNOSIS — E559 Vitamin D deficiency, unspecified: Secondary | ICD-10-CM | POA: Diagnosis not present

## 2023-09-17 DIAGNOSIS — Z6839 Body mass index (BMI) 39.0-39.9, adult: Secondary | ICD-10-CM

## 2023-09-17 DIAGNOSIS — E669 Obesity, unspecified: Secondary | ICD-10-CM | POA: Diagnosis not present

## 2023-09-17 DIAGNOSIS — Z0289 Encounter for other administrative examinations: Secondary | ICD-10-CM

## 2023-09-17 NOTE — Progress Notes (Signed)
 Office: [phone]  /  Fax: [phone]   Initial Visit  Keith Franklin was seen in clinic today to evaluate for obesity. He is interested in losing weight to improve overall health and reduce the risk of weight related complications. He presents today to review program treatment options, initial physical assessment, and evaluation.     Discussed the use of AI scribe software for clinical note transcription with the patient, who gave verbal consent to proceed.  History of Present Illness         He was referred by: PCP  When asked what else they would like to accomplish? He states: Adopt healthier eating patterns, Improve energy levels and physical activity, Improve existing medical conditions, Reduce number of medications, and Improve quality of life  When asked how has your weight affected you? He states: Contributed to medical problems, Contributed to orthopedic problems or mobility issues, Having fatigue, and Having poor endurance  Weight history: Weight gain sine MI, CABG x 3 10/03/2020  Some associated conditions: Hypertension, Hyperlipidemia, and Other: S/p MI  Contributing factors: Family history of obesity, Disruption of circadian rhythm / sleep disordered breathing, and Use of obesogenic medications: Beta-blockers  Weight promoting medications identified: Beta-blockers  Current nutrition plan: None  Current level of physical activity: None, Walking 20 minutes, four  a week, Running / Jogging 20 minutes, four , and Strength training 30 minutes, four   Current or previous pharmacotherapy: None  Response to medication: Never tried medications   Past medical history includes:   Past Medical History:  Diagnosis Date   Cancer (HCC) 2010   skin cancer, Basal Cell Carcinoma    COVID-19 07/2019   GERD (gastroesophageal reflux disease)    History of kidney stones    Myocardial infarction (HCC) 09/30/2020   Sleep apnea 10/05/2020   Using CPap has eliminated Apnea      Objective:   BP 128/74   Pulse 81   Temp 98.4 F (36.9 C)   Ht 5\' 7"  (1.702 m)   Wt 255 lb (115.7 kg)   SpO2 98%   BMI 39.94 kg/m  He was weighed on the bioimpedance scale: Body mass index is 39.94 kg/m.  Peak Weight:262 , Body Fat%:34.3, Visceral Fat Rating:23, Weight trend over the last 12 months: Increasing  General:  Alert, oriented and cooperative. Patient is in no acute distress.  Respiratory: Normal respiratory effort, no problems with respiration noted   Gait: able to ambulate independently  Mental Status: Normal mood and affect. Normal behavior. Normal judgment and thought content.   DIAGNOSTIC DATA REVIEWED:  BMET    Component Value Date/Time   NA 138 08/22/2023 1024   NA 140 08/21/2022 0940   K 4.2 08/22/2023 1024   CL 104 08/22/2023 1024   CO2 25 08/22/2023 1024   GLUCOSE 96 08/22/2023 1024   BUN 16 08/22/2023 1024   BUN 18 08/21/2022 0940   CREATININE 0.99 08/22/2023 1024   CALCIUM  9.5 08/22/2023 1024   GFRNONAA >60 03/13/2022 1623   Lab Results  Component Value Date   HGBA1C 5.9 08/22/2023   HGBA1C 5.9 (H) 09/30/2020   No results found for: "INSULIN " CBC    Component Value Date/Time   WBC 7.5 08/22/2023 1024   RBC 4.96 08/22/2023 1024   HGB 15.9 08/22/2023 1024   HGB 15.6 08/21/2022 0940   HCT 47.1 08/22/2023 1024   HCT 46.7 08/21/2022 0940   PLT 228.0 08/22/2023 1024   PLT 239 08/21/2022 0940   MCV 94.8 08/22/2023  1024   MCV 95 08/21/2022 0940   MCH 31.6 08/21/2022 0940   MCH 31.7 03/13/2022 1623   MCHC 33.8 08/22/2023 1024   RDW 13.3 08/22/2023 1024   RDW 12.5 08/21/2022 0940   Iron/TIBC/Ferritin/ %Sat No results found for: "IRON", "TIBC", "FERRITIN", "IRONPCTSAT" Lipid Panel     Component Value Date/Time   CHOL 108 08/22/2023 1024   CHOL 102 11/20/2022 0838   TRIG 65.0 08/22/2023 1024   HDL 42.80 08/22/2023 1024   HDL 44 11/20/2022 0838   CHOLHDL 3 08/22/2023 1024   VLDL 13.0 08/22/2023 1024   LDLCALC 52 08/22/2023 1024    LDLCALC 42 11/20/2022 0838   LDLCALC 32 01/13/2021 1516   Hepatic Function Panel     Component Value Date/Time   PROT 6.8 08/22/2023 1024   PROT 6.3 11/20/2022 0838   ALBUMIN  4.5 08/22/2023 1024   ALBUMIN  4.2 11/20/2022 0838   AST 20 08/22/2023 1024   ALT 18 08/22/2023 1024   ALKPHOS 50 08/22/2023 1024   BILITOT 0.8 08/22/2023 1024   BILITOT 0.7 11/20/2022 0838   BILIDIR 0.20 11/20/2022 0838   IBILI 0.4 09/30/2020 1525      Component Value Date/Time   TSH 2.59 08/22/2023 1024     Assessment and Plan:   Hyperglycemia  Vitamin D deficiency  Hyperlipidemia LDL goal <70  Obesity, morbid, BMI 40.0-49.9 (HCC)    Assessment and Plan         ESTABLISH WITH [facility]    Obesity Treatment / Action Plan:  Patient will work on garnering support from family and friends to begin weight loss journey. Will work on eliminating or reducing the presence of highly palatable, calorie dense foods in the home. Will complete provided nutritional and psychosocial assessment questionnaire before the next appointment. Will be scheduled for indirect calorimetry to determine resting energy expenditure in a fasting state.  This will allow us  to create a reduced calorie, high-protein meal plan to promote loss of fat mass while preserving muscle mass. Counseled on the health benefits of losing 5%-15% of total body weight. Was counseled on nutritional approaches to weight loss and benefits of reducing processed foods and consuming plant-based foods and high quality protein as part of nutritional weight management. Was counseled on pharmacotherapy and role as an adjunct in weight management.   Obesity Education Performed Today:  He was weighed on the bioimpedance scale and results were discussed and documented in the synopsis.  We discussed obesity as a disease and the importance of a more detailed evaluation of all the factors contributing to the disease.  We discussed the importance of long  term lifestyle changes which include nutrition, exercise and behavioral modifications as well as the importance of customizing this to his specific health and social needs.  We discussed the benefits of reaching a healthier weight to alleviate the symptoms of existing conditions and reduce the risks of the biomechanical, metabolic and psychological effects of obesity.  Myran Arcia appears to be in the action stage of change and states they are ready to start intensive lifestyle modifications and behavioral modifications.  I have spent 30 minutes in the care of the patient today including: preparing to see patient (e.g. review and interpretation of tests, old notes ), obtaining and/or reviewing separately obtained history, performing a medically appropriate examination or evaluation, counseling and educating the patient, documenting clinical information in the electronic or other health care record, and independently interpreting results and communicating results to the patient, family, or caregiver  Reviewed by clinician on day of visit: allergies, medications, problem list, medical history, surgical history, family history, social history, and previous encounter notes pertinent to obesity diagnosis.  Savannaha Stonerock d. Awad Gladd, NP-C

## 2023-10-14 ENCOUNTER — Encounter: Payer: Self-pay | Admitting: Cardiology

## 2023-10-14 MED ORDER — SACUBITRIL-VALSARTAN 24-26 MG PO TABS: ORAL_TABLET | ORAL | Status: DC

## 2023-10-14 NOTE — Telephone Encounter (Signed)
 Please review and advise.

## 2023-10-16 ENCOUNTER — Telehealth: Payer: Self-pay | Admitting: Cardiology

## 2023-10-16 NOTE — Telephone Encounter (Signed)
 Spoke to patient see 6/18 telephone note.

## 2023-10-16 NOTE — Telephone Encounter (Signed)
 Spoke to patient about email he sent yesterday.Dr.Jordan advised he can try stopping Entresto .Advised to call back to let Dr.Jordan know if symptoms improve.If so he will prescribe a low dose Losartan 25 mg daily instead.

## 2023-10-17 ENCOUNTER — Ambulatory Visit: Admitting: Physician Assistant

## 2023-10-17 ENCOUNTER — Encounter: Payer: Self-pay | Admitting: Physician Assistant

## 2023-10-17 VITALS — BP 118/72 | HR 77 | Temp 97.6°F | Ht 67.0 in | Wt 259.0 lb

## 2023-10-17 DIAGNOSIS — L719 Rosacea, unspecified: Secondary | ICD-10-CM | POA: Diagnosis not present

## 2023-10-17 DIAGNOSIS — Z951 Presence of aortocoronary bypass graft: Secondary | ICD-10-CM | POA: Diagnosis not present

## 2023-10-17 DIAGNOSIS — R42 Dizziness and giddiness: Secondary | ICD-10-CM

## 2023-10-17 DIAGNOSIS — R748 Abnormal levels of other serum enzymes: Secondary | ICD-10-CM | POA: Diagnosis not present

## 2023-10-17 MED ORDER — METRONIDAZOLE 0.75 % EX CREA: TOPICAL_CREAM | Freq: Two times a day (BID) | CUTANEOUS | 2 refills | Status: AC

## 2023-10-17 NOTE — Progress Notes (Signed)
 Patient ID: Keith Franklin, male    DOB: 1960-10-22, 63 y.o.   MRN: [id number]   Assessment & Plan:  Dizziness -     EKG 12-Lead -     CBC with Differential/Platelet -     Comprehensive metabolic panel with GFR -     Magnesium  -     CK  S/P CABG x 3 -     EKG 12-Lead -     CBC with Differential/Platelet -     Comprehensive metabolic panel with GFR -     Magnesium  -     CK  Rosacea -     metroNIDAZOLE; Apply topically 2 (two) times daily.  Dispense: 45 g; Refill: 2  Elevated CK -     CK      Assessment and Plan Assessment & Plan Dizziness He reports dizziness, confusion, anxiety, and jitteriness after starting Entresto , which worsened with exercise and persisted for 49 hours. Entresto  is suspected due to its known side effects of dizziness and hypotension. He discontinued Entresto  yesterday. Orthostatic vitals and EKG were normal, ruling out orthostatic hypotension. Flomax may also contribute to symptoms due to its potential to cause orthostatic hypotension, especially when combined with Entresto . - Check CBC, CMP, magnesium , and CK levels - Discontinue Flomax to assess symptom improvement  Heart Failure with Reduced Ejection Fraction He has heart failure with an ejection fraction of 35-40% as of the last echocardiogram in November. He experiences fatigue and was prescribed Entresto  to improve cardiac efficiency. The heart condition may contribute to symptoms, particularly during exercise. He is advised to reduce exercise intensity and continue cardiology follow-up. - Continue cardiology follow-up - Advise reduced exercise intensity  Rosacea He presents with a facial red spot, suspected to be rosacea. He has a history of skin issues, including previous squamous cell carcinoma. Metronidazole cream is prescribed for treatment. - Prescribe metronidazole cream, apply once daily initially - f/up with dermatology prn  Follow-up Ongoing symptoms and recent medication  changes require monitoring. He is advised to follow up with his cardiologist and report any symptom changes. - Follow up with cardiologist - Report any symptom changes      No follow-ups on file.    Subjective:    Chief Complaint  Patient presents with   Medication Problem    Entresto , think may be dealing with side effects; been on it for about 5wks; dizziness, confusion, lightheaded; spoke with cardio who prescribed and was told to stop taking; stopped taking a little over 24hrs; still dealing with a little dizziness and generally dont feel well.    lab work    Want to discuss the CK reading from last lab results    HPI Discussed the use of AI scribe software for clinical note transcription with the patient, who gave verbal consent to proceed.  History of Present Illness Keith Franklin is a 63 year old male with coronary artery disease and heart failure who presents with dizziness and confusion.  He has been experiencing dizziness and confusion since starting Entresto  approximately five weeks ago. The dizziness is described as a 'funky feeling' rather than room spinning and has been persistent for the last 49 hours. He also experiences episodes of confusion, such as mistaking a carton of half and half for a water  bottle and throwing his keys in the trash. Additionally, he has experienced jitteriness and twitching in his eyes.  Entresto  was started after a cardiologist visit to improve heart efficiency. Initially, he tolerated the  medication well but began experiencing dizziness during workouts, particularly during strength training. He describes an episode where he felt clammy and thought he might faint, prompting him to stop exercising and rest. The dizziness persisted into the next day, leading him to contact his cardiologist.  His medical history includes coronary artery disease and heart failure, with an ejection fraction of 35-40% from an echocardiogram last November.  He has been on metoprolol  for PVCs and was previously on a statin, which was stopped due to elevated CK levels and muscle aches. He is also taking Flomax, which he questions due to its potential side effects.  He mentions a recent insect bite that coincided with the onset of his symptoms, though he is unsure of its relevance. The bite was a red spot without itching or pustule formation.  In terms of his daily routine, he exercises regularly and monitors his blood pressure at home. He reports normal urination and bowel movements, and his blood pressure readings have been within normal ranges. He also uses a CPAP machine for sleep apnea, which he finds effective. No chest pain, tightness, or spinning dizziness.     Past Medical History:  Diagnosis Date   Cancer (HCC) 2010   skin cancer, Basal Cell Carcinoma    COVID-19 07/2019   GERD (gastroesophageal reflux disease)    History of kidney stones    Myocardial infarction (HCC) 09/30/2020   Sleep apnea 10/05/2020   Using CPap has eliminated Apnea    Past Surgical History:  Procedure Laterality Date   CORONARY ARTERY BYPASS GRAFT N/A 10/03/2020   Procedure: CORONARY ARTERY BYPASS GRAFTING (CABG) X THREE, ON PUMP, USING LEFT INTERNAL MAMMARY ARTERY AND RIGHT GREATER SAPHENOUS ENDOSCOPIC VEIN HARVEST CONDUITS;  Surgeon: Zelphia Higashi, MD;  Location: [facility] OR;  Service: Open Heart Surgery;  Laterality: N/A;  will harvest left radial artery for conduit Swan   CORONARY/GRAFT ACUTE MI REVASCULARIZATION N/A 09/30/2020   Procedure: Coronary/Graft Acute MI Revascularization;  Surgeon: Swaziland, Peter M, MD;  [facility] INVASIVE CV LAB;  Service: Cardiovascular;  Laterality: N/A;   CYSTOSCOPY WITH URETHRAL DILATATION N/A 04/10/2021   Procedure: CYSTOSCOPY WITH OPTILUME URETHRAL DILATATION;  Surgeon: Samson Croak, MD;  Location: [facility] ORS;  Service: Urology;  Laterality: N/A;   LEFT HEART CATH AND CORONARY ANGIOGRAPHY N/A 09/30/2020   Procedure:  LEFT HEART CATH AND CORONARY ANGIOGRAPHY;  Surgeon: Swaziland, Peter M, MD;  Location: [facility] INVASIVE CV LAB;  Service: Cardiovascular;  Laterality: N/A;   skin cancer removal     TONSILLECTOMY     WISDOM TOOTH EXTRACTION      Family History  Problem Relation Age of Onset   Cancer Mother    Heart attack Father    Heart disease Father    Sleep apnea Neg Hx    Colon cancer Neg Hx     Social History   Tobacco Use   Smoking status: Never   Smokeless tobacco: Never  Vaping Use   Vaping status: Never Used  Substance Use Topics   Alcohol use: Not Currently    Comment: rare   Drug use: Never     No Known Allergies  Review of Systems NEGATIVE UNLESS OTHERWISE INDICATED IN HPI      Objective:     BP 118/72   Pulse 77   Temp 97.6 F (36.4 C)   Ht 5' 7 (1.702 m)   Wt 259 lb (117.5 kg)   SpO2 96%   BMI 40.57 kg/m  Wt Readings from Last 3 Encounters:  10/17/23 259 lb (117.5 kg)  09/17/23 255 lb (115.7 kg)  09/11/23 262 lb (118.8 kg)    BP Readings from Last 3 Encounters:  10/17/23 118/72  09/17/23 128/74  09/11/23 129/77     Physical Exam Vitals and nursing note reviewed.  Constitutional:      General: He is not in acute distress.    Appearance: Normal appearance. He is obese. He is not ill-appearing.  HENT:     Head: Normocephalic.     Right Ear: Tympanic membrane, ear canal and external ear normal.     Left Ear: Tympanic membrane, ear canal and external ear normal.     Nose: No congestion.     Mouth/Throat:     Mouth: Mucous membranes are moist.     Pharynx: No oropharyngeal exudate or posterior oropharyngeal erythema.   Eyes:     Extraocular Movements: Extraocular movements intact.     Conjunctiva/sclera: Conjunctivae normal.     Pupils: Pupils are equal, round, and reactive to light.    Cardiovascular:     Rate and Rhythm: Normal rate and regular rhythm.     Pulses: Normal pulses.     Heart sounds: Normal heart sounds. No murmur heard. Pulmonary:      Effort: Pulmonary effort is normal. No respiratory distress.     Breath sounds: Normal breath sounds. No wheezing.   Musculoskeletal:     Cervical back: Normal range of motion.   Skin:    General: Skin is warm.     Findings: Erythema (cheeks and nose +small papule L cheek) present.   Neurological:     General: No focal deficit present.     Mental Status: He is alert and oriented to person, place, and time.     Sensory: No sensory deficit.     Motor: No weakness.     Coordination: Coordination normal.     Gait: Gait normal.   Psychiatric:        Mood and Affect: Mood normal.        Behavior: Behavior normal.           Tarina Volk M Karlynn Furrow, PA-C

## 2023-10-18 LAB — COMPREHENSIVE METABOLIC PANEL WITH GFR
ALT: 16 U/L (ref 0–53)
AST: 19 U/L (ref 0–37)
Albumin: 4 g/dL (ref 3.5–5.2)
Alkaline Phosphatase: 49 U/L (ref 39–117)
BUN: 20 mg/dL (ref 6–23)
CO2: 26 meq/L (ref 19–32)
Calcium: 9.3 mg/dL (ref 8.4–10.5)
Chloride: 106 meq/L (ref 96–112)
Creatinine, Ser: 1.09 mg/dL (ref 0.40–1.50)
GFR: 72.2 mL/min (ref >60.00)
Glucose, Bld: 108 mg/dL — ABNORMAL HIGH (ref 70–99)
Potassium: 4.1 meq/L (ref 3.5–5.1)
Sodium: 139 meq/L (ref 135–145)
Total Bilirubin: 0.5 mg/dL (ref 0.2–1.2)
Total Protein: 6.5 g/dL (ref 6.0–8.3)

## 2023-10-18 LAB — CBC WITH DIFFERENTIAL/PLATELET
Basophils Absolute: 0.1 10*3/uL (ref 0.0–0.1)
Basophils Relative: 1.5 % (ref 0.0–3.0)
Eosinophils Absolute: 0 10*3/uL (ref 0.0–0.7)
Eosinophils Relative: 0.6 % (ref 0.0–5.0)
HCT: 44.2 % (ref 39.0–52.0)
Hemoglobin: 14.8 g/dL (ref 13.0–17.0)
Lymphocytes Relative: 27 % (ref 12.0–46.0)
Lymphs Abs: 2.3 10*3/uL (ref 0.7–4.0)
MCHC: 33.5 g/dL (ref 30.0–36.0)
MCV: 92.8 fl (ref 78.0–100.0)
Monocytes Absolute: 0.8 10*3/uL (ref 0.1–1.0)
Monocytes Relative: 9.9 % (ref 3.0–12.0)
Neutro Abs: 5.2 10*3/uL (ref 1.4–7.7)
Neutrophils Relative %: 61 % (ref 43.0–77.0)
Platelets: 245 10*3/uL (ref 150.0–400.0)
RBC: 4.77 Mil/uL (ref 4.22–5.81)
RDW: 13.4 % (ref 11.5–15.5)
WBC: 8.5 10*3/uL (ref 4.0–10.5)

## 2023-10-18 LAB — MAGNESIUM: Magnesium: 2.1 mg/dL (ref 1.5–2.5)

## 2023-10-18 LAB — CK: Total CK: 187 U/L (ref 7–232)

## 2023-10-21 ENCOUNTER — Ambulatory Visit: Payer: Self-pay | Admitting: Physician Assistant

## 2023-10-29 ENCOUNTER — Encounter (INDEPENDENT_AMBULATORY_CARE_PROVIDER_SITE_OTHER): Payer: Self-pay

## 2023-11-05 ENCOUNTER — Ambulatory Visit: Payer: Self-pay | Admitting: Cardiology

## 2023-11-05 LAB — COMPREHENSIVE METABOLIC PANEL WITH GFR
ALT: 18 IU/L (ref 0–44)
AST: 18 IU/L (ref 0–40)
Albumin: 4.2 g/dL (ref 3.9–4.9)
Alkaline Phosphatase: 66 IU/L (ref 44–121)
BUN/Creatinine Ratio: 15 (ref 10–24)
BUN: 17 mg/dL (ref 8–27)
Bilirubin Total: 0.6 mg/dL (ref 0.0–1.2)
CO2: 20 mmol/L (ref 20–29)
Calcium: 9.4 mg/dL (ref 8.6–10.2)
Chloride: 103 mmol/L (ref 96–106)
Creatinine, Ser: 1.11 mg/dL (ref 0.76–1.27)
Globulin, Total: 2.3 g/dL (ref 1.5–4.5)
Glucose: 101 mg/dL — ABNORMAL HIGH (ref 70–99)
Potassium: 4.5 mmol/L (ref 3.5–5.2)
Sodium: 140 mmol/L (ref 134–144)
Total Protein: 6.5 g/dL (ref 6.0–8.5)
eGFR: 75 mL/min/1.73 (ref >59)

## 2023-11-05 LAB — LIPID PANEL
Chol/HDL Ratio: 4.6 ratio (ref 0.0–5.0)
Cholesterol, Total: 189 mg/dL (ref 100–199)
HDL: 41 mg/dL (ref >39)
LDL Chol Calc (NIH): 132 mg/dL — ABNORMAL HIGH (ref 0–99)
Triglycerides: 88 mg/dL (ref 0–149)
VLDL Cholesterol Cal: 16 mg/dL (ref 5–40)

## 2023-11-05 LAB — CK: Total CK: 152 U/L (ref 41–331)

## 2023-11-11 ENCOUNTER — Ambulatory Visit (INDEPENDENT_AMBULATORY_CARE_PROVIDER_SITE_OTHER): Payer: PRIVATE HEALTH INSURANCE | Admitting: Internal Medicine

## 2023-11-11 ENCOUNTER — Encounter (INDEPENDENT_AMBULATORY_CARE_PROVIDER_SITE_OTHER): Payer: Self-pay | Admitting: Internal Medicine

## 2023-11-11 VITALS — BP 130/84 | HR 82 | Temp 98.2°F | Ht 67.0 in | Wt 252.0 lb

## 2023-11-11 DIAGNOSIS — Z789 Other specified health status: Secondary | ICD-10-CM | POA: Insufficient documentation

## 2023-11-11 DIAGNOSIS — I2510 Atherosclerotic heart disease of native coronary artery without angina pectoris: Secondary | ICD-10-CM | POA: Insufficient documentation

## 2023-11-11 DIAGNOSIS — Z6839 Body mass index (BMI) 39.0-39.9, adult: Secondary | ICD-10-CM

## 2023-11-11 DIAGNOSIS — R7303 Prediabetes: Secondary | ICD-10-CM | POA: Diagnosis not present

## 2023-11-11 DIAGNOSIS — R0602 Shortness of breath: Secondary | ICD-10-CM

## 2023-11-11 DIAGNOSIS — Z1331 Encounter for screening for depression: Secondary | ICD-10-CM | POA: Diagnosis not present

## 2023-11-11 DIAGNOSIS — G4733 Obstructive sleep apnea (adult) (pediatric): Secondary | ICD-10-CM | POA: Insufficient documentation

## 2023-11-11 DIAGNOSIS — E7800 Pure hypercholesterolemia, unspecified: Secondary | ICD-10-CM | POA: Diagnosis not present

## 2023-11-11 DIAGNOSIS — R5383 Other fatigue: Secondary | ICD-10-CM

## 2023-11-11 DIAGNOSIS — E66812 Obesity, class 2: Secondary | ICD-10-CM

## 2023-11-11 NOTE — Progress Notes (Signed)
 [address] [place],  [place], [place] [address]  Office: [phone]  /  Fax: [phone]   Subjective   Initial Visit  Keith Franklin (MR# [id number]) is a 63 y.o. male who presents for evaluation and treatment of obesity and related comorbidities. Current BMI is Body mass index is 39.47 kg/m. Bran has been struggling with his weight for many years and has been unsuccessful in either losing weight, maintaining weight loss, or reaching his healthy weight goal.  [name] is currently in the action stage of change and ready to dedicate time achieving and maintaining a healthier weight. [name] is interested in becoming our patient and working on intensive lifestyle modifications including (but not limited to) diet and exercise for weight loss.  Discussed the use of AI scribe software for clinical note transcription with the patient, who gave verbal consent to proceed.  History of Present Illness   Keith Franklin is a 63 year old male with a history of heart attack and sleep apnea who presents for medical weight management. He is accompanied by his partner, Lonell Liverpool.  He is seeking medical weight management following a heart attack in 2022 and subsequent bypass surgery. He believes that weight loss would contribute to a healthier lifestyle and increase his energy levels. He experiences difficulty maintaining endurance during workouts at [facility] facility and reports fatigue and weakness during these times. His CK levels were elevated, leading to the discontinuation of statins (Lipitor  and Crestor ) due to muscular pain. His cholesterol levels were well-controlled on statins, but he could not tolerate them. Recent blood work showed an LDL of 132, which is higher than previous levels when on statins.  He has a history of prediabetes with an A1c around 5.6-5.7. He uses a CPAP machine every night for sleep apnea and does not experience chronic fatigue or endurance issues outside of the  gym context.  He has never smoked and has a family history of heart disease; his father had a heart attack at 62 and was a smoker. He reports weight gain beginning in his thirties, coinciding with a job change that required more travel and sedentary work. His siblings are also overweight, and his older sister has been on GLP-1 medications for weight loss.  He exercises four to five times a week for about 60 minutes and has been walking a lot during a recent vacation. He does not skip meals but feels very hungry mid-afternoon despite eating low-calorie meals. He previously lost about 20 pounds on a low-carb diet but found meal preparation challenging. He does not have food intolerances and craves sweets, consuming half-sweet, half-unsweet tea daily. He drinks mostly water  and one cup of coffee in the morning.        Weight history:  When asked how their weight has affected their life and health, he states: Contributed to medical problems and Contributed to orthopedic problems or mobility issues  When asked what else they would like to accomplish? He states: Adopt a healthier eating pattern and lifestyle, Improve energy levels and physical activity, Improve existing medical conditions, Reduce number of medications, Improve quality of life, and reduce CV risk hx of CABG  He starting to note weight gain during : adulthood.  Life events associated with weight gain include : job change.   Other contributing factors: family history of obesity, disruption of circadian rhythm / sleep disordered breathing, consumption of processed foods, use of obesogenic medications: Beta-blockers, moderate to high levels of stress, and strong orexigenic signaling and/or inadequate inhibitory  control .  Their highest weight has been:  258 lbs.  Desired weight: 200  Previous weight-loss programs : Low Carb.  Their maximum weight loss was:  20 lbs.  Their greatest challenge with dieting: meal preparation and  cooking.  Current or previous pharmacotherapy: None and Is interested in pharmacotherapy.  Response to medication: Never tried medications   Nutritional History:  Current nutrition plan: None.  How many times do you eat outside the home: 5-7 per week  How often do they skip meals: does not skip meals  What beverages do they drink: caffeinated beverages , sweet tea , and milk.   Use of artificial sweetners : Yes  Food intolerances or dislikes: none.  Food triggers: None.  Food cravings: Sugary  Do they struggle with excessive hunger or portion control : No    Physical Activity:  Current level of physical activity: He is participating in cardio and strengthening 4-5 times a week about 60 minutes he averages about 7000 steps    Past medical history includes:   Past Medical History:  Diagnosis Date   Cancer (HCC) 2010   skin cancer, Basal Cell Carcinoma    Chest pain    COVID-19 07/2019   GERD (gastroesophageal reflux disease)    History of kidney stones    Myocardial infarction (HCC) 09/30/2020   Obesity    Prediabetes    PVC (premature ventricular contraction)    Sleep apnea 10/05/2020   Using CPap has eliminated Apnea     Objective   BP 130/84   Pulse 82   Temp 98.2 F (36.8 C)   Ht 5' 7 (1.702 m)   Wt 252 lb (114.3 kg)   SpO2 95%   BMI 39.47 kg/m  He was weighed on the bioimpedance scale: Body mass index is 39.47 kg/m.    Anthropometrics:  Vitals Temp: 98.2 F (36.8 C) BP: 130/84 Pulse Rate: 82 SpO2: 95 %   Anthropometric Measurements Height: 5' 7 (1.702 m) Weight: 252 lb (114.3 kg) BMI (Calculated): 39.46 Starting Weight: 252 lb Peak Weight: 262 lb Waist Measurement : 47 inches   Body Composition  Body Fat %: 33.6 % Fat Mass (lbs): 84.6 lbs Muscle Mass (lbs): 159.2 lbs Total Body Water  (lbs): 116.2 lbs Visceral Fat Rating : 22   Other Clinical Data Fasting: yes Labs: yes Today's Visit #: 1 Starting Date:  11/11/23    Physical Exam:  General: He is overweight, cooperative, alert, well developed, and in no acute distress. PSYCH: Has normal mood, affect and thought process.   HEENT: EOMI, sclerae are anicteric. Lungs: Normal breathing effort, no conversational dyspnea. Extremities: No edema.  Neurologic: No gross sensory or motor deficits. No tremors or fasciculations noted.    Diagnostic Data Reviewed    Indirect Calorimeter completed today shows a VO2 of 296 and a REE of 2045.  His calculated basal metabolic rate is 7724 thus his resting energy expenditure slower than calculated.  Depression Screen  Victorhugo's PHQ-9 score was: 4.     11/11/2023    9:03 AM  Depression screen PHQ 2/9  Decreased Interest 0  Down, Depressed, Hopeless 0  PHQ - 2 Score 0  Altered sleeping 3  Tired, decreased energy 1  Change in appetite 0  Feeling bad or failure about yourself  0  Trouble concentrating 0  Moving slowly or fidgety/restless 0  Suicidal thoughts 0  PHQ-9 Score 4  Difficult doing work/chores Not difficult at all    Screening for Sleep Related  Breathing Disorders  Anis denies daytime somnolence and denies waking up still tired. Patient no longer has a history of symptoms of OSA due to wearing CPAP. Zyere generally gets 6 or 7 hours of sleep per night, and states that he has nightime awakenings. Snoring is not present due to CPAP. Apneic episodes are not present. Epworth Sleepiness Score is 4.   BMET    Component Value Date/Time   NA 140 11/04/2023 0917   K 4.5 11/04/2023 0917   CL 103 11/04/2023 0917   CO2 20 11/04/2023 0917   GLUCOSE 101 (H) 11/04/2023 0917   GLUCOSE 108 (H) 10/17/2023 1554   BUN 17 11/04/2023 0917   CREATININE 1.11 11/04/2023 0917   CALCIUM  9.4 11/04/2023 0917   GFRNONAA >60 03/13/2022 1623   Lab Results  Component Value Date   HGBA1C 5.9 08/22/2023   HGBA1C 5.9 (H) 09/30/2020   No results found for: INSULIN  CBC    Component Value  Date/Time   WBC 8.5 10/17/2023 1554   RBC 4.77 10/17/2023 1554   HGB 14.8 10/17/2023 1554   HGB 15.6 08/21/2022 0940   HCT 44.2 10/17/2023 1554   HCT 46.7 08/21/2022 0940   PLT 245.0 10/17/2023 1554   PLT 239 08/21/2022 0940   MCV 92.8 10/17/2023 1554   MCV 95 08/21/2022 0940   MCH 31.6 08/21/2022 0940   MCH 31.7 03/13/2022 1623   MCHC 33.5 10/17/2023 1554   RDW 13.4 10/17/2023 1554   RDW 12.5 08/21/2022 0940   Iron/TIBC/Ferritin/ %Sat No results found for: IRON, TIBC, FERRITIN, IRONPCTSAT Lipid Panel     Component Value Date/Time   CHOL 189 11/04/2023 0917   TRIG 88 11/04/2023 0917   HDL 41 11/04/2023 0917   CHOLHDL 4.6 11/04/2023 0917   CHOLHDL 3 08/22/2023 1024   VLDL 13.0 08/22/2023 1024   LDLCALC 132 (H) 11/04/2023 0917   LDLCALC 32 01/13/2021 1516   Hepatic Function Panel     Component Value Date/Time   PROT 6.5 11/04/2023 0917   ALBUMIN  4.2 11/04/2023 0917   AST 18 11/04/2023 0917   ALT 18 11/04/2023 0917   ALKPHOS 66 11/04/2023 0917   BILITOT 0.6 11/04/2023 0917   BILIDIR 0.20 11/20/2022 0838   IBILI 0.4 09/30/2020 1525      Component Value Date/Time   TSH 2.59 08/22/2023 1024     Assessment and Plan   TREATMENT PLAN FOR OBESITY:  Recommended Dietary Goals  Haralambos is currently in the action stage of change. As such, his goal is to implement medically supervised obesity management plan.  He has agreed to implement: the Category 3 plan - 1500 kcal per day  Behavioral Intervention  We discussed the following Behavioral Modification Strategies today: increasing lean protein intake to established goals, decreasing simple carbohydrates , increasing vegetables, increasing lower glycemic fruits, increasing fiber rich foods, avoiding skipping meals, increasing water  intake, work on meal planning and preparation, reading food labels , keeping healthy foods at home, identifying sources and decreasing liquid calories, decreasing eating out or  consumption of processed foods, and making healthy choices when eating convenient foods, planning for success, and better snacking choices  Additional resources provided today: Handout on healthy eating and balanced plate, Handout on complex carbohydrates and lean sources of protein, and Category 3 packet  Recommended Physical Activity Goals  Fernie has been advised to work up to 150 minutes of moderate intensity aerobic activity a week and strengthening exercises 2-3 times per week for cardiovascular health, weight loss maintenance  and preservation of muscle mass.   He has agreed to :  Think about enjoyable ways to increase daily physical activity and overcoming barriers to exercise and Increase physical activity in their day and reduce sedentary time (increase NEAT).  Medical Interventions and Pharmacotherapy We will work on building a Therapist, art and behavioral strategies. We will discuss the role of pharmacotherapy as an adjunct at subsequent visits.   ASSOCIATED CONDITIONS ADDRESSED TODAY  Other Fatigue Doak denies daytime somnolence and denies waking up still tired. Patient no longer has a history of symptoms of OSA due to wearing CPAP. Dex generally gets 6 or 7 hours of sleep per night, and states that he has nightime awakenings. Snoring is not present due to CPAP. Apneic episodes are not present. Epworth Sleepiness Score is 4. Elya does feel that his weight is causing his energy to be lower than it should be. Fatigue may be related to obesity, depression or many other causes. Labs will be ordered, and in the meanwhile, Karas will focus on self care including making healthy food choices, increasing physical activity and focusing on stress reduction.  Shortness of Breath Emery notes increasing shortness of breath with physical activity and seems to be worsening over time with weight gain. He notes getting out of breath sooner with activity than  he used to. This has not gotten worse recently. Aris denies shortness of breath at rest or orthopnea.   Assessment & Plan SOB (shortness of breath) on exertion  Other fatigue  Depression screen  Atherosclerosis of native coronary artery of native heart without angina pectoris  Hypercholesteremia  Prediabetes  OSA (obstructive sleep apnea) - moderate,  AHI 16, [facility] 2022  Statin intolerance  Class 2 severe obesity with serious comorbidity and body mass index (BMI) of 39.0 to 39.9 in adult, unspecified obesity type (HCC)       Obesity Dempsey presents for medical weight management with a BMI of 39, indicating obesity. Contributing factors include genetics, lifestyle changes, and possibly medications like metoprolol . He has experienced weight gain since his 30s, likely exacerbated by a sedentary job and travel, with a family history of obesity among siblings. Obesity is contributing to his cardiovascular risk and insulin  resistance. The focus is on reducing body fat percentage, particularly visceral fat, which is linked to insulin  resistance and cardiovascular risk. - Initiate a nutrition plan focusing on a calorie deficit of 1500 calories per day to achieve gradual weight loss. - Emphasize a diet low in carbohydrates and high in protein to manage insulin  levels and reduce visceral fat. - Provide educational materials on the effects of carbohydrates and insulin  resistance. - Encourage regular physical activity and meal preparation to support weight management. - Discuss potential pharmacotherapy options, such as GLP-1 agonists, in future appointments.  Prediabetes Dempsey has prediabetes with an A1c around 5.6-5.7%. Prediabetes increases cardiovascular risk and is associated with insulin  resistance, a precursor to diabetes. Insulin  resistance is contributing to his cardiovascular risk and is likely related to his obesity and visceral fat. Hyperinsulinemia is a concern and will be  assessed. - Check insulin  levels to assess for hyperinsulinemia. - Focus on a low carbohydrate diet to manage insulin  levels and reduce cardiovascular risk.  Coronary Artery Disease (CAD) Dempsey experienced a myocardial infarction in 2022 and underwent subsequent bypass surgery. He is statin intolerant due to muscle pain and elevated CK levels, which resolved after discontinuation of statins. His LDL cholesterol is currently elevated at 132 mg/dL, above the target level to prevent  plaque buildup. He has a family history of early heart disease. Vitamin D  deficiency may contribute to statin intolerance, and correcting it could allow for a statin rechallenge. Alternative lipid-lowering therapies are considered if statin intolerance persists. - Check vitamin D  levels to assess for deficiency, which may contribute to statin intolerance. - Consider rechallenging with a statin if vitamin D  deficiency is corrected. - Discuss alternative lipid-lowering therapies if statin intolerance persists.  Statin Intolerance Dempsey experienced muscle pain and elevated CK levels while on statins (atorvastatin  and rosuvastatin ), leading to discontinuation. His cholesterol levels were well-controlled on statins, but he could not tolerate them due to side effects. Vitamin D  deficiency may contribute to statin intolerance, and correcting it could allow for a statin rechallenge. Alternative lipid-lowering therapies are considered if statin intolerance persists. - Check vitamin D  levels to assess for deficiency, which may contribute to statin intolerance. - Consider rechallenging with a statin if vitamin D   deficiency is corrected. - Discuss alternative lipid-lowering therapies if statin intolerance persists.  Sleep Apnea Dempsey has sleep apnea and uses a CPAP machine nightly. Sleep apnea has a bidirectional relationship with obesity, contributing to energy regulation issues and weight gain. - Continue CPAP therapy to manage  sleep apnea.  General Health Maintenance Dempsey is advised to focus on a balanced diet, regular physical activity, and weight management to improve overall health and reduce cardiovascular risk. Emphasis is placed on reducing processed food intake and managing stress levels. - Encourage a diet rich in whole foods, low in saturated fats, and high in fiber. - Promote regular physical activity and meal preparation. - Educate on the importance of reducing processed food intake and managing stress levels.  Follow-up Dempsey will need ongoing monitoring and support for his weight management and cardiovascular health. - Schedule follow-up appointment in two weeks to review progress and discuss potential pharmacotherapy options.       Follow-up  He was informed of the importance of frequent follow-up visits to maximize his success with intensive lifestyle modifications for his multiple health conditions. He was informed we would discuss his lab results at his next visit unless there is a critical issue that needs to be addressed sooner. Harjit agreed to keep his next visit at the agreed upon time to discuss these results.  Attestation Statement  This is the patient's intake visit at [facility]. The patient's Health Questionnaire was reviewed at length. Included in the packet: current and past health history, medications, allergies, ROS, gynecologic history (women only), surgical history, family history, social history, weight history, weight loss surgery history (for those that have had weight loss surgery), nutritional evaluation, mood and food questionnaire, PHQ9, Epworth questionnaire, sleep habits questionnaire, patient life and health improvement goals questionnaire. These will all be scanned into the patient's chart under media.   During the visit, I independently reviewed the patient's, previous labs, bioimpedance scale results, and indirect calorimetry results. I used this  information to medically tailor a meal plan for the patient that will help him to lose weight and will improve his obesity-related conditions. I performed a medically necessary appropriate examination and/or evaluation. I discussed the assessment and treatment plan with the patient. The patient was provided an opportunity to ask questions and all were answered. The patient agreed with the plan and demonstrated an understanding of the instructions. Labs were ordered at this visit and will be reviewed at the next visit unless critical results need to be addressed immediately. Clinical information was updated and documented in  the EMR.   In addition, they received basic education on identification of processed foods and reduction of these, different sources of lean proteins and complex carbohydrates and how to eat balanced by incorporation of whole foods.  Reviewed by clinician on day of visit: allergies, medications, problem list, medical history, surgical history, family history, social history, and previous encounter notes.  I have spent 61 minutes in the care of the patient today including: 13 minutes before the visit reviewing and preparing the chart. 41 minutes face-to-face assessing and reviewing listed medical problems as outlined in obesity care plan, providing nutritional and behavioral counseling on topics outlined in the obesity care plan, independently interpreting test results and goals of care, as described in assessment and plan, reviewing and discussing biometric information and progress, and ordering diagnostics - see orders 7 minutes after the visit updating chart and documentation of encounter.       Lucas Parker, MD

## 2023-11-12 ENCOUNTER — Telehealth: Payer: Self-pay | Admitting: Pharmacist

## 2023-11-12 LAB — INSULIN, RANDOM: INSULIN: 29.4 u[IU]/mL — ABNORMAL HIGH (ref 2.6–24.9)

## 2023-11-12 LAB — VITAMIN D 25 HYDROXY (VIT D DEFICIENCY, FRACTURES): Vit D, 25-Hydroxy: 22.9 ng/mL — ABNORMAL LOW (ref 30.0–100.0)

## 2023-11-12 LAB — VITAMIN B12: Vitamin B-12: 413 pg/mL (ref 232–1245)

## 2023-11-12 NOTE — Progress Notes (Unsigned)
 Patient ID: Amariyon Maynes                 DOB: November 10, 1960                    MRN: [id number]      HPI: Keith Franklin is a 63 y.o. male patient referred to lipid clinic by Dr.Jordan. 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 Rosuvastatin  (Crestor ) was discontinued, and follow-up lipid labs were ordered. CK was 263 U/L while on statin and decreased to 152 U/L on 11/04/23. LDL was 42 mg/dL while on rosuvastatin  but increased to 132 mg/dL as of 92/92/74 after stopping the statin. Triglycerides remain at goal off statin therapy. The patient previously tried atorvastatin  (Lipitor ) at 40 mg and 80 mg, which also caused myalgia.  Current Visit:  The patient presented to the lipid clinic to review options for lowering LDL cholesterol given the intolerance to statins. We discussed alternatives including ezetimibe, PCSK9 inhibitors, bempedoic acid, and inclisiran. The mechanisms of action, dosing, side effects, and expected LDL reductions were explained. Cost considerations and patient assistance programs were also reviewed.  The patient has started attending a healthy weight and wellness clinic for weight loss and will begin a nutritionist-guided diet next week.  The patient reported dizziness during gym workouts. Entresto  was discontinued by Dr. [place]; despite stopping for 4-5 days, dizziness persisted. The primary care provider recommended stopping tamsulosin, which the patient has done for the past couple of days, and symptoms have improved.  The patient is a [employer] fund[employer] by profession and suspects slow metabolism of medications, which might explain his sensitivity and side effects despite good therapeutic responses.  Blood pressure measured at the PCP office was 138/82 mmHg, with home readings consistently in this  range. Correct techniques for home blood pressure measurement were reviewed, and the patient was advised to maintain a log and report via MyChart.  Intolerances: myalgia - atorvastatin  and myagia with CK elevation - rosuvastatin   Risk Factors: CAD, CABG 09/2020, hx of STEMI,  LDL goal: <70 mg/dl and TG <849 mg/dl  Last lab TC 810, TG 88, HDL 41, LDL 132 (11/04/23)  Diet: follows diet design by healthy weight and wellness nutritionist   Exercise:  4-5 times per week - 30 min cardio and 30 min resistance  Family History:  Relation Problem Comments  Mother - Avelina (Deceased) Cancer     Father - Abisai (Deceased) Heart attack   Heart disease   Sleep apnea      Social History:  Alcohol: none  Smoking: none  Labs:  Lipid Panel     Component Value Date/Time   CHOL 189 11/04/2023 0917   TRIG 88 11/04/2023 0917   HDL 41 11/04/2023 0917   CHOLHDL 4.6 11/04/2023 0917   CHOLHDL 3 08/22/2023 1024   VLDL 13.0 08/22/2023 1024   LDLCALC 132 (H) 11/04/2023 0917   LDLCALC 32 01/13/2021 1516   LABVLDL 16 11/04/2023 0917    Past Medical History:  Diagnosis Date   Cancer (HCC) 2010   skin cancer, Basal Cell Carcinoma    Chest pain    COVID-19 07/2019   GERD (gastroesophageal reflux disease)    History of kidney stones    Myocardial infarction (HCC) 09/30/2020   Obesity    Prediabetes    PVC (premature ventricular contraction)  Sleep apnea 10/05/2020   Using CPap has eliminated Apnea    Current Outpatient Medications on File Prior to Visit  Medication Sig Dispense Refill   acetaminophen  (TYLENOL ) 500 MG tablet Take 1,000 mg by mouth every 6 (six) hours as needed for moderate pain.     aspirin  EC 81 MG EC tablet Take 1 tablet (81 mg total) by mouth daily. Swallow whole.     diphenhydramine -acetaminophen  (TYLENOL  PM) 25-500 MG TABS tablet Take 1 tablet by mouth at bedtime.     Famotidine  (PEPCID  PO) Take by mouth.     METOPROLOL  SUCCINATE PO Take 25 mg by mouth daily.      metroNIDAZOLE  (METROCREAM ) 0.75 % cream Apply topically 2 (two) times daily. 45 g 2   nitroGLYCERIN  (NITROSTAT ) 0.4 MG SL tablet Place 0.4 mg under the tongue every 5 (five) minutes as needed for chest pain.     tamsulosin (FLOMAX) 0.4 MG CAPS capsule Take 0.4 mg by mouth daily.     Zoster Vaccine Adjuvanted (SHINGRIX ) injection Inject into the muscle. 0.5 mL 1   No current facility-administered medications on file prior to visit.    No Known Allergies  Assessment/Plan:  1. Hyperlipidemia -  Problem  Hypercholesteremia   Intolerances: myalgia - atorvastatin  and myagia with CK elevation - rosuvastatin   Risk Factors: CAD, CABG 09/2020, hx of STEMI,  LDL goal: <70 mg/dl and TG <849 mg/dl  Last lab TC 810, TG 88, HDL 41, LDL 132 (11/04/23)    Hypercholesteremia Assessment:  LDL goal: < 70 mg/dl last LDLc 867 mg/dl (92/92/7974) Intolerance to statins - myalgia - atorvastatin  and myagia with CK elevation - rosuvastatin  Discussed next potential options (ezetimibe PCSK-9 inhibitors, bempedoic acid and inclisiran); cost, dosing efficacy, side effects  Patient exercise regularly and will be starting nutritionist guided diet plan to loose weight starting from next week   Plan: Will apply for PA for PCSK9i; will inform patient upon approval  In future if LDL still remains elevated he is willing to try low dose Crestor    Lipid lab due in 2-3 months of therapy modification     Thank you,  Robbi Blanch, Pharm.D [facility]. [facility] [address], [place], [place] [address] Phone: [phone]; Fax: [phone]

## 2023-11-13 ENCOUNTER — Encounter: Payer: Self-pay | Admitting: Pharmacist

## 2023-11-13 ENCOUNTER — Telehealth: Payer: Self-pay | Admitting: Pharmacy Technician

## 2023-11-13 ENCOUNTER — Other Ambulatory Visit (HOSPITAL_COMMUNITY): Payer: Self-pay

## 2023-11-13 ENCOUNTER — Ambulatory Visit: Attending: Cardiology | Admitting: Pharmacist

## 2023-11-13 DIAGNOSIS — E7800 Pure hypercholesterolemia, unspecified: Secondary | ICD-10-CM | POA: Diagnosis not present

## 2023-11-13 NOTE — Telephone Encounter (Signed)
 Pharmacy Patient Advocate Encounter   Received notification from [employer] that prior authorization for Repatha  140mg  is required/requested.   Insurance verification completed.   The patient is insured through [facility] .   Per test claim: PA required; PA submitted to above mentioned insurance via latent Key/confirmation #/EOC [id number] Status is pending

## 2023-11-13 NOTE — Assessment & Plan Note (Signed)
 Assessment:  LDL goal: < 70 mg/dl last LDLc 867 mg/dl (92/92/7974) Intolerance to statins - myalgia - atorvastatin  and myagia with CK elevation - rosuvastatin  Discussed next potential options (ezetimibe PCSK-9 inhibitors, bempedoic acid and inclisiran); cost, dosing efficacy, side effects  Patient exercise regularly and will be starting nutritionist guided diet plan to loose weight starting from next week   Plan: Will apply for PA for PCSK9i; will inform patient upon approval  In future if LDL still remains elevated he is willing to try low dose Crestor    Lipid lab due in 2-3 months of therapy modification

## 2023-11-13 NOTE — Patient Instructions (Signed)
 Your Results:             Your most recent labs Goal  Total Cholesterol 189 < 200  Triglycerides 88 < 150  HDL (happy/good cholesterol) 41 > 40  LDL (lousy/bad cholesterol 132 < 70   Medication changes: We will start the process to get PCSK9i (Repatha  or Praluent)  covered by your insurance.  Once the prior authorization is complete, we will call you to let you know and confirm pharmacy information.   Praluent is a cholesterol medication that improved your body's ability to get rid of bad cholesterol known as LDL. It can lower your LDL up to 60%. It is an injection that is given under the skin every 2 weeks. The most common side effects of Praluent include runny nose, symptoms of the common cold, rarely flu or flu-like symptoms, back/muscle pain in about 3-4% of the patients, and redness, pain, or bruising at the injection site.    Repatha  is a cholesterol medication that improved your body's ability to get rid of bad cholesterol known as LDL. It can lower your LDL up to 60%! It is an injection that is given under the skin every 2 weeks. The medication often requires a prior authorization from your insurance company. The most common side effects of Repatha  include runny nose, symptoms of the common cold, rarely flu or flu-like symptoms, back/muscle pain in about 3-4% of the patients, and redness, pain, or bruising at the injection site.   Lab orders: We want to repeat labs after 2-3 months.  We will send you a lab order to remind you once we get closer to that time.       Bring  your BP cuff and your record of home blood pressures to your next appointment.    HOW TO TAKE YOUR BLOOD PRESSURE AT HOME  Rest 5 minutes before taking your blood pressure.  Don't smoke or drink caffeinated beverages for at least 30 minutes before. Take your blood pressure before (not after) you eat. Sit comfortably with your back supported and both feet on the floor (don't cross your legs). Elevate your  arm to heart level on a table or a desk. Use the proper sized cuff. It should fit smoothly and snugly around your bare upper arm. There should be enough room to slip a fingertip under the cuff. The bottom edge of the cuff should be 1 inch above the crease of the elbow. Ideally, take 3 measurements at one sitting and record the average.  Important lifestyle changes to control high blood pressure  Intervention  Effect on the BP  Lose extra pounds and watch your waistline Weight loss is one of the most effective lifestyle changes for controlling blood pressure. If you're overweight or obese, losing even a small amount of weight can help reduce blood pressure. Blood pressure might go down by about 1 millimeter of mercury (mm Hg) with each kilogram (about 2.2 pounds) of weight lost.  Exercise regularly As a general goal, aim for at least 30 minutes of moderate physical activity every day. Regular physical activity can lower high blood pressure by about 5 to 8 mm Hg.  Eat a healthy diet Eating a diet rich in whole grains, fruits, vegetables, and low-fat dairy products and low in saturated fat and cholesterol. A healthy diet can lower high blood pressure by up to 11 mm Hg.  Reduce salt (sodium) in your diet Even a small reduction of sodium in the diet can improve heart  health and reduce high blood pressure by about 5 to 6 mm Hg.  Limit alcohol One drink equals 12 ounces of beer, 5 ounces of wine, or 1.5 ounces of 80-proof liquor.  Limiting alcohol to less than one drink a day for women or two drinks a day for men can help lower blood pressure by about 4 mm Hg.   If you have any questions or concerns please use My Chart to send questions or call the office at [phone]

## 2023-11-13 NOTE — Telephone Encounter (Signed)
 I got him a coupon and now copay is 15.00 instead of 80.00. I called the patient and asked if he wanted to get it at [facility] and he said he would like it at our [facility] please. I have already put the coupon in wam and made a note. Thank you!   Pharmacy Patient Advocate Encounter  Received notification from [facility] that Prior Authorization for REPATHA  140MG  has been APPROVED from 11/13/23 to 05/15/24. Ran test claim, Copay is $80.00. This test claim was processed through [facility]- copay amounts may vary at other pharmacies due to pharmacy/plan contracts, or as the patient moves through the different stages of their insurance plan.   PA #/Case ID/Reference #: [id number]

## 2023-11-13 NOTE — Telephone Encounter (Signed)
    Pharmacy Patient Advocate Encounter   Received notification from [employer] that prior authorization for Repatha  is required/requested.   Insurance verification completed.   The patient is insured through [facility] .   Per test claim: PA required; PA started via CoverMyMeds. KEY [id number] . Waiting for clinical questions to populate.

## 2023-11-14 ENCOUNTER — Other Ambulatory Visit (HOSPITAL_BASED_OUTPATIENT_CLINIC_OR_DEPARTMENT_OTHER): Payer: Self-pay

## 2023-11-14 MED ORDER — REPATHA SURECLICK 140 MG/ML ~~LOC~~ SOAJ
140.0000 mg | SUBCUTANEOUS | 3 refills | Status: DC
  Filled 2023-11-14: qty 6, 84d supply, fill #0
  Filled 2024-01-17: qty 6, 84d supply, fill #1
  Filled 2024-02-26: qty 6, 84d supply, fill #2

## 2023-11-14 NOTE — Addendum Note (Signed)
 Addended by: Alfard Cochrane K on: 11/14/2023 07:01 AM   Modules accepted: Orders

## 2023-11-25 ENCOUNTER — Encounter (INDEPENDENT_AMBULATORY_CARE_PROVIDER_SITE_OTHER): Payer: Self-pay | Admitting: Internal Medicine

## 2023-11-25 ENCOUNTER — Ambulatory Visit (INDEPENDENT_AMBULATORY_CARE_PROVIDER_SITE_OTHER): Payer: PRIVATE HEALTH INSURANCE | Admitting: Internal Medicine

## 2023-11-25 VITALS — BP 117/74 | HR 55 | Temp 98.3°F | Ht 67.0 in | Wt 244.0 lb

## 2023-11-25 DIAGNOSIS — G4733 Obstructive sleep apnea (adult) (pediatric): Secondary | ICD-10-CM | POA: Diagnosis not present

## 2023-11-25 DIAGNOSIS — E7800 Pure hypercholesterolemia, unspecified: Secondary | ICD-10-CM

## 2023-11-25 DIAGNOSIS — E88819 Insulin resistance, unspecified: Secondary | ICD-10-CM | POA: Insufficient documentation

## 2023-11-25 DIAGNOSIS — R7303 Prediabetes: Secondary | ICD-10-CM | POA: Diagnosis not present

## 2023-11-25 DIAGNOSIS — Z951 Presence of aortocoronary bypass graft: Secondary | ICD-10-CM

## 2023-11-25 DIAGNOSIS — E559 Vitamin D deficiency, unspecified: Secondary | ICD-10-CM | POA: Insufficient documentation

## 2023-11-25 DIAGNOSIS — E66812 Obesity, class 2: Secondary | ICD-10-CM

## 2023-11-25 DIAGNOSIS — Z6839 Body mass index (BMI) 39.0-39.9, adult: Secondary | ICD-10-CM

## 2023-11-25 MED ORDER — VITAMIN D3 50 MCG (2000 UT) PO CAPS: 2000.0000 [IU] | ORAL_CAPSULE | Freq: Every day | ORAL | Status: AC

## 2023-11-25 NOTE — Assessment & Plan Note (Signed)
 Most recent vitamin D  levels  Lab Results  Component Value Date   VD25OH 22.9 (L) 11/11/2023     Deficiency state associated with adiposity and may result in leptin resistance, weight gain and fatigue.  This may also decrease tolerance to statins.  Plan: Start vitamin D3 2000 international units daily

## 2023-11-25 NOTE — Assessment & Plan Note (Signed)
 Most recent A1c is  Lab Results  Component Value Date   HGBA1C 5.9 08/22/2023   HGBA1C 5.9 (H) 09/30/2020    Patient aware of disease state and risk of progression. This may contribute to abnormal cravings, fatigue and diabetic complications without having diabetes.   We have discussed treatment options which include: losing 7 to 10% of body weight, increasing physical activity to a goal of 150 minutes a week at moderate intensity.  Advised to maintain a diet low on simple and processed carbohydrates.  We discussed the role and benefits of metformin and GLP-1 for pharmacoprophylaxis.  At present time he would like to hold off starting medications.

## 2023-11-25 NOTE — Assessment & Plan Note (Signed)
 Patient has lost 8 pounds with reductions in SAT and VAT.  He has high levels of visceral adiposity.  Continue current weight management strategy.  May benefit from GLP-1 and metformin this was reviewed today.

## 2023-11-25 NOTE — Assessment & Plan Note (Signed)
 Stable on Repatha , beta-blocker and antiplatelet therapy.  He is also on Entresto .  Blood pressure is well-controlled.  We discussed benefits of GLP-1 treatment and cardiovascular risk reduction.  He would like to hold off and starting medication at present time.

## 2023-11-25 NOTE — Assessment & Plan Note (Signed)
 LDL is not at goal. Elevated LDL may be secondary to nutrition, genetics and spillover effect from excess adiposity. Recommended LDL goal is <70 to reduce the risk of fatty streaks and the progression to obstructive ASCVD in the future.   His 10 year risk is: The ASCVD Risk score (Arnett DK, et al., 2019) failed to calculate for the following reasons:   Risk score cannot be calculated because patient has a medical history suggesting prior/existing ASCVD  Lab Results  Component Value Date   CHOL 189 11/04/2023   HDL 41 11/04/2023   LDLCALC 132 (H) 11/04/2023   TRIG 88 11/04/2023   CHOLHDL 4.6 11/04/2023    Continue weight loss therapy, losing 10% or more of body weight may improve condition. Also advised to reduce saturated fats in diet to less than 10% of daily calories.  He is now on Repatha  as he did not tolerate statin therapy.

## 2023-11-25 NOTE — Progress Notes (Signed)
 Office: [phone]  /  Fax: [phone]  Weight Summary And Body Composition Analysis (BIA)  Vitals Temp: 98.3 F (36.8 C) BP: 117/74 Pulse Rate: (!) 55 SpO2: 95 %   Anthropometric Measurements Height: 5' 7 (1.702 m) Weight: 244 lb (110.7 kg) BMI (Calculated): 38.21 Weight at Last Visit: 252 lb Weight Lost Since Last Visit: 8 lb Weight Gained Since Last Visit: 0 lb Starting Weight: 252 lb Total Weight Loss (lbs): 8 lb (3.629 kg) Peak Weight: 262 lb   Body Composition  Body Fat %: 32.5 % Fat Mass (lbs): 79.4 lbs Muscle Mass (lbs): 156.4 lbs Total Body Water  (lbs): 111.6 lbs Visceral Fat Rating : 21    RMR: 2045  Today's Visit #: 2  Starting Date: 11/11/23   Subjective   Chief Complaint: Obesity  Keith Franklin is here to discuss his progress with his obesity treatment plan. He is following the Category 3 plan - 1500 kcal per day and states he is following his eating plan approximately 90-100% of the time. He states he is exercising 60 minutes 6 times per week..  Weight Progress Since Last Visit:  Since last office visit he has lost 8 pounds. He reports good adherence to reduced calorie nutritional plan. He has been working on reading food labels, not skipping meals, increasing protein intake at every meal, eating more fruits, eating more vegetables, drinking more water , avoiding or reducing simple and processed carbohydrates, journaling and tracking calories, making healthier choices, continues to exercise, working on meal prepping, reducing portion sizes, mindfulness around eating, controlling orexigenic cues and stimuli, and incorporating more whole foods   Nutritional 24 HR Recall: Intake consistent with prescribed nutritional plan  Challenges affecting patient progress: none.   Orexigenic Control: Reports improved problems with appetite and hunger signals.  Reports improved problems with satiety and satiation.  Denies problems with eating patterns and  portion control.  Denies abnormal cravings. Denies feeling deprived or restricted.   Pharmacotherapy for weight management: He is currently taking no anti-obesity medication.   Assessment and Plan   Treatment Plan For Obesity:  Recommended Dietary Goals  Keith Franklin is currently in the action stage of change. As such, his goal is to continue weight management plan. He has agreed to: continue current plan  Behavioral Health and Counseling  We discussed the following behavioral modification strategies today: continue to work on maintaining a reduced calorie state, getting the recommended amount of protein, incorporating whole foods, making healthy choices, staying well hydrated and practicing mindfulness when eating..  Additional education and resources provided today: Handout and personalized instruction on tracking and journaling using Apps, Handout on increasing daily activity and exercise goal setting, and Provided with personal guidance and instructions on how to use [email or web address] for healthy meal ideas and cooking in bulk.  Recommended Physical Activity Goals  Keith Franklin has been advised to work up to 150 minutes of moderate intensity aerobic activity a week and strengthening exercises 2-3 times per week for cardiovascular health, weight loss maintenance and preservation of muscle mass.   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  He is also on Entresto .  Blood pressure is well-controlled.  We discussed benefits of GLP-1 treatment and cardiovascular risk reduction.  He would like to hold off  and starting medication at present time. OSA (obstructive sleep apnea) - moderate,  AHI  16, PSMG 2022 On CPAP with reported good compliance. Continue PAP therapy. Losing 15% or more of body weight may improve AHI.    Prediabetes Most recent A1c is  Lab Results  Component Value Date   HGBA1C 5.9 08/22/2023   HGBA1C 5.9 (H) 09/30/2020    Patient aware of disease state and risk of progression. This may contribute to abnormal cravings, fatigue and diabetic complications without having diabetes.   We have discussed treatment options which include: losing 7 to 10% of body weight, increasing physical activity to a goal of 150 minutes a week at moderate intensity.  Advised to maintain a diet low on simple and processed carbohydrates.  We discussed the role and benefits of metformin and GLP-1 for pharmacoprophylaxis.  At present time he would like to hold off starting medications.  Class 2 severe obesity with serious comorbidity and body mass index (BMI) of 39.0 to 39.9 in adult, unspecified obesity type Keith Franklin) Patient has lost 8 pounds with reductions in SAT and VAT.  He has high levels of visceral adiposity.  Continue current weight management strategy.  May benefit from GLP-1 and metformin this was reviewed today. Hypercholesteremia LDL is not at goal. Elevated LDL may be secondary to nutrition, genetics and spillover effect from excess adiposity. Recommended LDL goal is <70 to reduce the risk of fatty streaks and the progression to obstructive ASCVD in the future.   His 10 year risk is: The ASCVD Risk score (Arnett DK, et al., 2019) failed to calculate for the following reasons:   Risk score cannot be calculated because patient has a medical history suggesting prior/existing ASCVD  Lab Results  Component Value Date   CHOL 189 11/04/2023   HDL 41 11/04/2023   LDLCALC 132 (H) 11/04/2023   TRIG 88 11/04/2023   CHOLHDL 4.6 11/04/2023    Continue weight loss therapy, losing 10% or more of body weight may improve condition. Also advised to reduce saturated fats in diet to less  than 10% of daily calories.  He is now on Repatha  as he did not tolerate statin therapy.    Insulin  resistance His HOMA-IR is 7.56 which is elevated. Optimal level < 1.9.   This is complex condition associated with genetics, ectopic fat and lifestyle factors. Insulin  resistance may result in increased fat storage, inhibition of the breakdown of fat, cause fluctuations in blood sugar leading to energy crashes and increased cravings for sugary or high carb foods and cause metabolic slowdown making it difficult to lose weight.  This may result in additional weight gain and lead to pre-diabetes and diabetes if untreated. In addition, hyperinsulinemia increases cardiovascular risk, chronic inflammatory response and may increase the risk of obesity related malignancies.  Lab Results  Component Value Date   HGBA1C 5.9 08/22/2023   Lab Results  Component Value Date   INSULIN  29.4 (H) 11/11/2023   Lab Results  Component Value Date   GLUCOSE 101 (H) 11/04/2023   GLUCOSE 173 (H) 09/30/2020    We reviewed treatment options which include losing 7 to 10% of body weight, increasing volume of physical activity and maintaining a diet low in saturated fats and with a low glycemic load.  Patient has also been educated on the carb insulin  model of obesity.  He may also be a candidate for pharmacoprophylaxis with metformin and / or GLP1 medication.   Vitamin D  deficiency Most recent vitamin D  levels  Lab Results  Component Value Date   VD25OH 22.9 (L) 11/11/2023     Deficiency state associated with adiposity and may result in leptin resistance, weight gain and fatigue.  This may also decrease tolerance to statins.  Plan: Start vitamin D3 2000 international units daily    Cancer prevention: Patient informed of increased cancer risk associated with obesity and insulin  resistance.  He is up-to-date on colorectal and prostate cancer screening.  He is not eligible for lung cancer as he is a lifelong  non-smoker.  Cardiovascular risk counseling: Patient has intolerance to statins he is now on Repatha .  His blood pressure is well-controlled he is on antiplatelet, beta-blocker and has started physical activity.  Objective   Physical Exam:  Blood pressure 117/74, pulse (!) 55, temperature 98.3 F (36.8 C), height 5' 7 (1.702 m), weight 244 lb (110.7 kg), SpO2 95%. Body mass index is 38.22 kg/m.  General: He is overweight, cooperative, alert, well developed, and in no acute distress. PSYCH: Has normal mood, affect and thought process.   HEENT: EOMI, sclerae are anicteric. Lungs: Normal breathing effort, no conversational dyspnea. Extremities: No edema.  Neurologic: No gross sensory or motor deficits. No tremors or fasciculations noted.    Diagnostic Data Reviewed:  BMET    Component Value Date/Time   NA 140 11/04/2023 0917   K 4.5 11/04/2023 0917   CL 103 11/04/2023 0917   CO2 20 11/04/2023 0917   GLUCOSE 101 (H) 11/04/2023 0917   GLUCOSE 108 (H) 10/17/2023 1554   BUN 17 11/04/2023 0917   CREATININE 1.11 11/04/2023 0917   CALCIUM  9.4 11/04/2023 0917   GFRNONAA >60 03/13/2022 1623   Lab Results  Component Value Date   HGBA1C 5.9 08/22/2023   HGBA1C 5.9 (H) 09/30/2020   Lab Results  Component Value Date   INSULIN  29.4 (H) 11/11/2023   Lab Results  Component Value Date   TSH 2.59 08/22/2023   CBC    Component Value Date/Time   WBC 8.5 10/17/2023 1554   RBC 4.77 10/17/2023 1554   HGB 14.8 10/17/2023 1554   HGB 15.6 08/21/2022 0940   HCT 44.2 10/17/2023 1554   HCT 46.7 08/21/2022 0940   PLT 245.0 10/17/2023 1554   PLT 239 08/21/2022 0940   MCV 92.8 10/17/2023 1554   MCV 95 08/21/2022 0940   MCH 31.6 08/21/2022 0940   MCH 31.7 03/13/2022 1623   MCHC 33.5 10/17/2023 1554   RDW 13.4 10/17/2023 1554   RDW 12.5 08/21/2022 0940   Iron Studies No results found for: IRON, TIBC, FERRITIN, IRONPCTSAT Lipid Panel     Component Value Date/Time   CHOL  189 11/04/2023 0917   TRIG 88 11/04/2023 0917   HDL 41 11/04/2023 0917   CHOLHDL 4.6 11/04/2023 0917   CHOLHDL 3 08/22/2023 1024   VLDL 13.0 08/22/2023 1024   LDLCALC 132 (H) 11/04/2023 0917   LDLCALC 32 01/13/2021 1516   Hepatic Function Panel     Component Value Date/Time   PROT 6.5 11/04/2023 0917   ALBUMIN  4.2 11/04/2023 0917   AST 18 11/04/2023 0917   ALT 18 11/04/2023 0917   ALKPHOS 66 11/04/2023 0917   BILITOT 0.6 11/04/2023 0917   BILIDIR 0.20 11/20/2022 0838   IBILI 0.4 09/30/2020 1525      Component Value Date/Time   TSH 2.59 08/22/2023 1024   Nutritional Lab Results  Component Value Date   VD25OH 22.9 (L) 11/11/2023    Medications: Outpatient Encounter Medications as of 11/25/2023  Medication Sig Note   Cholecalciferol (VITAMIN D3) 50 MCG (2000 UT) capsule Take 1 capsule (2,000 Units total) by mouth daily.    sacubitril -valsartan  (ENTRESTO ) 24-26 MG Take 1 tablet by mouth 2 (two) times daily.    acetaminophen  (TYLENOL ) 500 MG tablet Take 1,000 mg by mouth every 6 (six) hours as needed for moderate pain.    aspirin  EC 81 MG EC tablet Take 1 tablet (81 mg total) by mouth daily. Swallow whole.    diphenhydramine -acetaminophen  (TYLENOL  PM) 25-500 MG TABS tablet Take 1 tablet by mouth at bedtime. 09/11/2023: On hold   Evolocumab  (REPATHA  SURECLICK) 140 MG/ML SOAJ Inject 140 mg into the skin every 14 (fourteen) days.    Famotidine  (PEPCID  PO) Take by mouth.    METOPROLOL  SUCCINATE PO Take 25 mg by mouth daily.    metroNIDAZOLE  (METROCREAM ) 0.75 % cream Apply topically 2 (two) times daily.    nitroGLYCERIN  (NITROSTAT ) 0.4 MG SL tablet Place 0.4 mg under the tongue every 5 (five) minutes as needed for chest pain.    tamsulosin (FLOMAX) 0.4 MG CAPS capsule Take 0.4 mg by mouth daily. (Patient not taking: Reported on 11/25/2023)    [DISCONTINUED] Zoster Vaccine Adjuvanted (SHINGRIX ) injection Inject into the muscle.    No facility-administered encounter medications on  file as of 11/25/2023.     Follow-Up   Return in about 3 weeks (around 12/16/2023) for For Weight Mangement with Dr. Francyne.SABRA He was informed of the importance of frequent follow up visits to maximize his success with intensive lifestyle modifications for his multiple health conditions.  Attestation Statement   Reviewed by clinician on day of visit: allergies, medications, problem list, medical history, surgical history, family history, social history, and previous encounter notes.   I have spent 49 minutes in the care of the patient today including: 3 minutes before the visit reviewing and preparing the chart. 39 minutes face-to-face assessing and reviewing listed medical problems as outlined in obesity care plan, providing nutritional and behavioral counseling on topics outlined in the obesity care plan, counseling regarding anti-obesity medication as outlined in obesity care plan, independently interpreting test results and goals of care, as described in assessment and plan, and reviewing and discussing biometric information and progress 7 minutes after the visit updating chart and documentation of encounter.    Lucas Francyne, MD

## 2023-11-25 NOTE — Assessment & Plan Note (Signed)
 On CPAP with reported good compliance. Continue PAP therapy. Losing 15% or more of body weight may improve AHI.

## 2023-11-25 NOTE — Progress Notes (Deleted)
 Office: [phone]  /  Fax: [phone]  Weight Summary and Body Composition Analysis (BIA)  Vitals Temp: 98.3 F (36.8 C) BP: 117/74 Pulse Rate: (!) 55 SpO2: 95 %   Anthropometric Measurements Height: 5' 7 (1.702 m) Weight: 244 lb (110.7 kg) BMI (Calculated): 38.21 Weight at Last Visit: 252 lb Weight Lost Since Last Visit: 8 lb Weight Gained Since Last Visit: 0 lb Starting Weight: 252 lb Total Weight Loss (lbs): 8 lb (3.629 kg) Peak Weight: 262 lb   Body Composition  Body Fat %: 32.5 % Fat Mass (lbs): 79.4 lbs Muscle Mass (lbs): 156.4 lbs Total Body Water  (lbs): 111.6 lbs Visceral Fat Rating : 21    RMR: 2045  Today's Visit #: 2  Starting Date: 11/11/23   Subjective   Chief Complaint: Obesity  Interval History ***  Challenges affecting patient progress: {EMOBESITYBARRIERS:28841::none}.    Pharmacotherapy for weight management: He is currently taking {EMPharmaco:28845}.   Assessment and Plan   Treatment Plan For Obesity:  Recommended Dietary Goals  Raziel is currently in the action stage of change. As such, his goal is to continue weight management plan. He has agreed to: {EMWTLOSSPLAN:29297::continue current plan}  Behavioral Health and Counseling  We discussed the following behavioral modification strategies today: {EMWMwtlossstrategies:28914::continue to work on maintaining a reduced calorie state, getting the recommended amount of protein, incorporating whole foods, making healthy choices, staying well hydrated and practicing mindfulness when eating.}.  Additional education and resources provided today: {EMadditionalresources:29169::None}  Recommended Physical Activity Goals  Keith Franklin has been advised to work up to 150 minutes of moderate intensity aerobic activity a week and strengthening exercises 2-3 times per week for cardiovascular health, weight loss maintenance and preservation of muscle mass.   He has agreed to :   {EMEXERCISE:28847::Think about enjoyable ways to increase daily physical activity and overcoming barriers to exercise,Increase physical activity in their day and reduce sedentary time (increase NEAT).}  Medical Interventions and Pharmacotherapy  We discussed various medication options to help [place] with his weight loss efforts and we both agreed to : {EMagreedrx:29170}  Associated Conditions Impacted by Obesity Treatment  Assessment & Plan S/P CABG x 3  OSA (obstructive sleep apnea) - moderate,  AHI 16, PSMG 2022  Prediabetes  Class 2 severe obesity with serious comorbidity and body mass index (BMI) of 39.0 to 39.9 in adult, unspecified obesity type (HCC)  Hypercholesteremia  Insulin  resistance     ***  Objective   Physical Exam:  Blood pressure 117/74, pulse (!) 55, temperature 98.3 F (36.8 C), height 5' 7 (1.702 m), weight 244 lb (110.7 kg), SpO2 95%. Body mass index is 38.22 kg/m.  General: He is overweight, cooperative, alert, well developed, and in no acute distress. PSYCH: Has normal mood, affect and thought process.   HEENT: EOMI, sclerae are anicteric. Lungs: Normal breathing effort, no conversational dyspnea. Extremities: No edema.  Neurologic: No gross sensory or motor deficits. No tremors or fasciculations noted.    Diagnostic Data Reviewed:  BMET    Component Value Date/Time   NA 140 11/04/2023 0917   K 4.5 11/04/2023 0917   CL 103 11/04/2023 0917   CO2 20 11/04/2023 0917   GLUCOSE 101 (H) 11/04/2023 0917   GLUCOSE 108 (H) 10/17/2023 1554   BUN 17 11/04/2023 0917   CREATININE 1.11 11/04/2023 0917   CALCIUM  9.4 11/04/2023 0917   GFRNONAA >60 03/13/2022 1623   Lab Results  Component Value Date   HGBA1C 5.9 08/22/2023   HGBA1C 5.9 (H) 09/30/2020  Lab Results  Component Value Date   INSULIN  29.4 (H) 11/11/2023   Lab Results  Component Value Date   TSH 2.59 08/22/2023   CBC    Component Value Date/Time   WBC 8.5 10/17/2023  1554   RBC 4.77 10/17/2023 1554   HGB 14.8 10/17/2023 1554   HGB 15.6 08/21/2022 0940   HCT 44.2 10/17/2023 1554   HCT 46.7 08/21/2022 0940   PLT 245.0 10/17/2023 1554   PLT 239 08/21/2022 0940   MCV 92.8 10/17/2023 1554   MCV 95 08/21/2022 0940   MCH 31.6 08/21/2022 0940   MCH 31.7 03/13/2022 1623   MCHC 33.5 10/17/2023 1554   RDW 13.4 10/17/2023 1554   RDW 12.5 08/21/2022 0940   Iron Studies No results found for: IRON, TIBC, FERRITIN, IRONPCTSAT Lipid Panel     Component Value Date/Time   CHOL 189 11/04/2023 0917   TRIG 88 11/04/2023 0917   HDL 41 11/04/2023 0917   CHOLHDL 4.6 11/04/2023 0917   CHOLHDL 3 08/22/2023 1024   VLDL 13.0 08/22/2023 1024   LDLCALC 132 (H) 11/04/2023 0917   LDLCALC 32 01/13/2021 1516   Hepatic Function Panel     Component Value Date/Time   PROT 6.5 11/04/2023 0917   ALBUMIN  4.2 11/04/2023 0917   AST 18 11/04/2023 0917   ALT 18 11/04/2023 0917   ALKPHOS 66 11/04/2023 0917   BILITOT 0.6 11/04/2023 0917   BILIDIR 0.20 11/20/2022 0838   IBILI 0.4 09/30/2020 1525      Component Value Date/Time   TSH 2.59 08/22/2023 1024   Nutritional Lab Results  Component Value Date   VD25OH 22.9 (L) 11/11/2023    Medications: Outpatient Encounter Medications as of 11/25/2023  Medication Sig Note   sacubitril -valsartan  (ENTRESTO ) 24-26 MG Take 1 tablet by mouth 2 (two) times daily.    acetaminophen  (TYLENOL ) 500 MG tablet Take 1,000 mg by mouth every 6 (six) hours as needed for moderate pain.    aspirin  EC 81 MG EC tablet Take 1 tablet (81 mg total) by mouth daily. Swallow whole.    diphenhydramine -acetaminophen  (TYLENOL  PM) 25-500 MG TABS tablet Take 1 tablet by mouth at bedtime. 09/11/2023: On hold   Evolocumab  (REPATHA  SURECLICK) 140 MG/ML SOAJ Inject 140 mg into the skin every 14 (fourteen) days.    Famotidine  (PEPCID  PO) Take by mouth.    METOPROLOL  SUCCINATE PO Take 25 mg by mouth daily.    metroNIDAZOLE  (METROCREAM ) 0.75 % cream Apply  topically 2 (two) times daily.    nitroGLYCERIN  (NITROSTAT ) 0.4 MG SL tablet Place 0.4 mg under the tongue every 5 (five) minutes as needed for chest pain.    tamsulosin (FLOMAX) 0.4 MG CAPS capsule Take 0.4 mg by mouth daily. (Patient not taking: Reported on 11/25/2023)    [DISCONTINUED] Zoster Vaccine Adjuvanted (SHINGRIX ) injection Inject into the muscle.    No facility-administered encounter medications on file as of 11/25/2023.     Follow-Up   No follow-ups on file.SABRA He was informed of the importance of frequent follow up visits to maximize his success with intensive lifestyle modifications for his multiple health conditions.  Attestation Statement   Reviewed by clinician on day of visit: allergies, medications, problem list, medical history, surgical history, family history, social history, and previous encounter notes.     Lucas Parker, MD

## 2023-11-25 NOTE — Assessment & Plan Note (Signed)
 His HOMA-IR is 7.56 which is elevated. Optimal level < 1.9.   This is complex condition associated with genetics, ectopic fat and lifestyle factors. Insulin  resistance may result in increased fat storage, inhibition of the breakdown of fat, cause fluctuations in blood sugar leading to energy crashes and increased cravings for sugary or high carb foods and cause metabolic slowdown making it difficult to lose weight.  This may result in additional weight gain and lead to pre-diabetes and diabetes if untreated. In addition, hyperinsulinemia increases cardiovascular risk, chronic inflammatory response and may increase the risk of obesity related malignancies.  Lab Results  Component Value Date   HGBA1C 5.9 08/22/2023   Lab Results  Component Value Date   INSULIN  29.4 (H) 11/11/2023   Lab Results  Component Value Date   GLUCOSE 101 (H) 11/04/2023   GLUCOSE 173 (H) 09/30/2020    We reviewed treatment options which include losing 7 to 10% of body weight, increasing volume of physical activity and maintaining a diet low in saturated fats and with a low glycemic load.  Patient has also been educated on the carb insulin  model of obesity.  He may also be a candidate for pharmacoprophylaxis with metformin and / or GLP1 medication.

## 2023-12-19 ENCOUNTER — Encounter (INDEPENDENT_AMBULATORY_CARE_PROVIDER_SITE_OTHER): Payer: Self-pay | Admitting: Internal Medicine

## 2023-12-19 ENCOUNTER — Ambulatory Visit (INDEPENDENT_AMBULATORY_CARE_PROVIDER_SITE_OTHER): Payer: PRIVATE HEALTH INSURANCE | Admitting: Internal Medicine

## 2023-12-19 VITALS — BP 115/75 | HR 57 | Temp 98.5°F | Ht 67.0 in | Wt 238.0 lb

## 2023-12-19 DIAGNOSIS — R7303 Prediabetes: Secondary | ICD-10-CM | POA: Diagnosis not present

## 2023-12-19 DIAGNOSIS — G4733 Obstructive sleep apnea (adult) (pediatric): Secondary | ICD-10-CM | POA: Diagnosis not present

## 2023-12-19 DIAGNOSIS — E7800 Pure hypercholesterolemia, unspecified: Secondary | ICD-10-CM | POA: Diagnosis not present

## 2023-12-19 DIAGNOSIS — E66812 Obesity, class 2: Secondary | ICD-10-CM | POA: Diagnosis not present

## 2023-12-19 DIAGNOSIS — Z6839 Body mass index (BMI) 39.0-39.9, adult: Secondary | ICD-10-CM

## 2023-12-19 NOTE — Progress Notes (Signed)
 Office: [phone]  /  Fax: [phone]  Weight Summary and Body Composition Analysis (BIA)  Vitals Temp: 98.5 F (36.9 C) BP: 115/75 Pulse Rate: (!) 57 SpO2: 97 %   Anthropometric Measurements Height: 5' 7 (1.702 m) Weight: 238 lb (108 kg) BMI (Calculated): 37.27 Weight at Last Visit: 244 lb Weight Lost Since Last Visit: 6 lb Weight Gained Since Last Visit: 0 lb Starting Weight: 252 lb Total Weight Loss (lbs): 14 lb (6.35 kg) Peak Weight: 262 lb   Body Composition  Body Fat %: 31.8 % Fat Mass (lbs): 76 lbs Muscle Mass (lbs): 154.8 lbs Total Body Water  (lbs): 112.4 lbs Visceral Fat Rating : 20    RMR: 2045  Today's Visit #: 3  Starting Date: 11/11/23   Subjective   Chief Complaint: Obesity  Interval History Discussed the use of AI scribe software for clinical note transcription with the patient, who gave verbal consent to proceed.  History of Present Illness   Keith Franklin is a 63 year old male who presents for medical weight management.  Since the last visit, he has lost six pounds. He adheres to a 1500 calorie nutrition plan, tracking his intake, consuming more whole foods, ensuring adequate protein and hydration, and not skipping meals. He exercises five days a week for sixty minutes, focusing on sprinting cardio.  He feels better overall but struggles with sleep. Despite using a CPAP machine effectively, he wakes up around 4 or 5 AM. He takes Tylenol  PM at night but recently tried Tylenol  for back pain instead, still waking early. He describes feeling 'a little blah' during the day due to this sleep issue.  He tracks his weight and body composition, noting a peak weight of 262 pounds this year and starting the program at 252 pounds. He observes a decrease in body fat and an increase in muscle mass, maintaining his exercise routine even while traveling. He reports a noticeable change in waist size and muscle definition.   He has  eliminated sugary drinks, now drinking coffee in the morning and water  throughout the day. A previous issue with mucous in his throat after eating has resolved since changing his diet.  He focuses on consuming less processed food, incorporating more vegetables and fruits, and exploring high-protein snacks. He currently uses [employer] protein bars and is interested in trying protein balls.       Challenges affecting patient progress: none.    Pharmacotherapy for weight management: He is currently taking no anti-obesity medication.   Assessment and Plan   Treatment Plan For Obesity:  Recommended Dietary Goals  Jahmani is currently in the action stage of change. As such, his goal is to continue weight management plan. He has agreed to: continue current plan  Behavioral Health and Counseling  We discussed the following behavioral modification strategies today: continue to work on maintaining a reduced calorie state, getting the recommended amount of protein, incorporating whole foods, making healthy choices, staying well hydrated and practicing mindfulness when eating..  Additional education and resources provided today: None  Recommended Physical Activity Goals  Johntae has been advised to work up to 150 minutes of moderate intensity aerobic activity a week and strengthening exercises 2-3 times per week for cardiovascular health, weight loss maintenance and preservation of muscle mass.   He has agreed to :  Think about enjoyable ways to increase daily physical activity and overcoming barriers to exercise and Increase physical activity in their day and reduce sedentary time (increase NEAT).  Medical  Interventions and Pharmacotherapy  We discussed various medication options to help [place] with his weight loss efforts and we both agreed to : Continue with current nutritional and behavioral strategies  Associated Conditions Impacted by Obesity Treatment  Assessment & Plan OSA  (obstructive sleep apnea) - moderate,  AHI 16, PSMG 2022 On CPAP with reported good compliance. Continue PAP therapy. Losing 15% or more of body weight may improve AHI.    Prediabetes Most recent A1c is  Lab Results  Component Value Date   HGBA1C 5.9 08/22/2023   HGBA1C 5.9 (H) 09/30/2020    Patient aware of disease state and risk of progression. This may contribute to abnormal cravings, fatigue and diabetic complications without having diabetes.   We have discussed treatment options which include: losing 7 to 10% of body weight, increasing physical activity to a goal of 150 minutes a week at moderate intensity.  Advised to maintain a diet low on simple and processed carbohydrates.  We discussed the role and benefits of metformin and GLP-1 for pharmacoprophylaxis.  At present time he would like to hold off starting medications.  Hypercholesteremia LDL is not at goal. Elevated LDL may be secondary to nutrition, genetics and spillover effect from excess adiposity. Recommended LDL goal is <70 to reduce the risk of fatty streaks and the progression to obstructive ASCVD in the future.   His 10 year risk is: The ASCVD Risk score (Arnett DK, et al., 2019) failed to calculate for the following reasons:   Risk score cannot be calculated because patient has a medical history suggesting prior/existing ASCVD  Lab Results  Component Value Date   CHOL 189 11/04/2023   HDL 41 11/04/2023   LDLCALC 132 (H) 11/04/2023   TRIG 88 11/04/2023   CHOLHDL 4.6 11/04/2023    Has a history of intolerance to statins he is currently on Repatha  repeat fasting lipid profile in 3 months.  Class 2 severe obesity with serious comorbidity and body mass index (BMI) of 39.0 to 39.9 in adult, unspecified obesity type (HCC) Weight: decrease of 24 lb (9.2%) over 3 months, 1 week  Start: 09/11/2023 262 lb (118.8 kg)  End: 12/19/2023 238 lb (108 kg)   Obesity with significant weight loss progress. He has lost six pounds  since the last visit, adhering to a 1500 calorie nutrition plan and exercising five days a week. Total weight loss since May is 24 pounds, nearly 10% of his body weight. Body fat percentage is now 31%, with a goal of less than 25%. Visceral fat rating has decreased from 23 to 20, with a goal of under 10. Discussed the benefits of GLP-1 medications and metformin for reducing visceral fat and aiding weight loss. Metformin is considered due to its effects on insulin  stabilization, appetite reduction, and cholesterol improvement. GLP-1 medications offer additional benefits, including reducing inflammation, improving insulin  resistance, and lowering cardiovascular risks, but are expensive and require long-term use. He is currently focused on lifestyle changes and is making excellent progress. - Continue current weight management strategy with 1500 calorie nutrition plan and regular exercise. - Consider metformin for additional weight loss and metabolic benefits if lifestyle changes plateau. - Reassess weight and body composition in one month.               Objective   Physical Exam:  Blood pressure 115/75, pulse (!) 57, temperature 98.5 F (36.9 C), height 5' 7 (1.702 m), weight 238 lb (108 kg), SpO2 97%. Body mass index is 37.28 kg/m.  General:  He is overweight, cooperative, alert, well developed, and in no acute distress. PSYCH: Has normal mood, affect and thought process.   HEENT: EOMI, sclerae are anicteric. Lungs: Normal breathing effort, no conversational dyspnea. Extremities: No edema.  Neurologic: No gross sensory or motor deficits. No tremors or fasciculations noted.    Diagnostic Data Reviewed:  BMET    Component Value Date/Time   NA 140 11/04/2023 0917   K 4.5 11/04/2023 0917   CL 103 11/04/2023 0917   CO2 20 11/04/2023 0917   GLUCOSE 101 (H) 11/04/2023 0917   GLUCOSE 108 (H) 10/17/2023 1554   BUN 17 11/04/2023 0917   CREATININE 1.11 11/04/2023 0917   CALCIUM  9.4  11/04/2023 0917   GFRNONAA >60 03/13/2022 1623   Lab Results  Component Value Date   HGBA1C 5.9 08/22/2023   HGBA1C 5.9 (H) 09/30/2020   Lab Results  Component Value Date   INSULIN  29.4 (H) 11/11/2023   Lab Results  Component Value Date   TSH 2.59 08/22/2023   CBC    Component Value Date/Time   WBC 8.5 10/17/2023 1554   RBC 4.77 10/17/2023 1554   HGB 14.8 10/17/2023 1554   HGB 15.6 08/21/2022 0940   HCT 44.2 10/17/2023 1554   HCT 46.7 08/21/2022 0940   PLT 245.0 10/17/2023 1554   PLT 239 08/21/2022 0940   MCV 92.8 10/17/2023 1554   MCV 95 08/21/2022 0940   MCH 31.6 08/21/2022 0940   MCH 31.7 03/13/2022 1623   MCHC 33.5 10/17/2023 1554   RDW 13.4 10/17/2023 1554   RDW 12.5 08/21/2022 0940   Iron Studies No results found for: IRON, TIBC, FERRITIN, IRONPCTSAT Lipid Panel     Component Value Date/Time   CHOL 189 11/04/2023 0917   TRIG 88 11/04/2023 0917   HDL 41 11/04/2023 0917   CHOLHDL 4.6 11/04/2023 0917   CHOLHDL 3 08/22/2023 1024   VLDL 13.0 08/22/2023 1024   LDLCALC 132 (H) 11/04/2023 0917   LDLCALC 32 01/13/2021 1516   Hepatic Function Panel     Component Value Date/Time   PROT 6.5 11/04/2023 0917   ALBUMIN  4.2 11/04/2023 0917   AST 18 11/04/2023 0917   ALT 18 11/04/2023 0917   ALKPHOS 66 11/04/2023 0917   BILITOT 0.6 11/04/2023 0917   BILIDIR 0.20 11/20/2022 0838   IBILI 0.4 09/30/2020 1525      Component Value Date/Time   TSH 2.59 08/22/2023 1024   Nutritional Lab Results  Component Value Date   VD25OH 22.9 (L) 11/11/2023    Medications: Outpatient Encounter Medications as of 12/19/2023  Medication Sig Note   acetaminophen  (TYLENOL ) 500 MG tablet Take 1,000 mg by mouth every 6 (six) hours as needed for moderate pain.    aspirin  EC 81 MG EC tablet Take 1 tablet (81 mg total) by mouth daily. Swallow whole.    Cholecalciferol (VITAMIN D3) 50 MCG (2000 UT) capsule Take 1 capsule (2,000 Units total) by mouth daily.     diphenhydramine -acetaminophen  (TYLENOL  PM) 25-500 MG TABS tablet Take 1 tablet by mouth at bedtime. 09/11/2023: On hold   Evolocumab  (REPATHA  SURECLICK) 140 MG/ML SOAJ Inject 140 mg into the skin every 14 (fourteen) days.    Famotidine  (PEPCID  PO) Take by mouth.    METOPROLOL  SUCCINATE PO Take 25 mg by mouth daily.    metroNIDAZOLE  (METROCREAM ) 0.75 % cream Apply topically 2 (two) times daily.    nitroGLYCERIN  (NITROSTAT ) 0.4 MG SL tablet Place 0.4 mg under the tongue every 5 (five) minutes as  needed for chest pain.    sacubitril -valsartan  (ENTRESTO ) 24-26 MG Take 1 tablet by mouth 2 (two) times daily.    tamsulosin (FLOMAX) 0.4 MG CAPS capsule Take 0.4 mg by mouth daily. (Patient not taking: Reported on 11/25/2023)    No facility-administered encounter medications on file as of 12/19/2023.     Follow-Up   Return in about 4 weeks (around 01/16/2024) for For Weight Mangement with Dr. Francyne.SABRA He was informed of the importance of frequent follow up visits to maximize his success with intensive lifestyle modifications for his multiple health conditions.  Attestation Statement   Reviewed by clinician on day of visit: allergies, medications, problem list, medical history, surgical history, family history, social history, and previous encounter notes.   I have spent 35 minutes in the care of the patient today including: 2 minutes before the visit reviewing and preparing the chart. 29 minutes face-to-face assessing and reviewing listed medical problems as outlined in obesity care plan, providing nutritional and behavioral counseling on topics outlined in the obesity care plan, counseling regarding anti-obesity medication as outlined in obesity care plan, independently interpreting test results and goals of care, as described in assessment and plan, and reviewing and discussing biometric information and progress 3 minutes after the visit updating chart and documentation of encounter.    Lucas Francyne, MD

## 2023-12-19 NOTE — Assessment & Plan Note (Signed)
 LDL is not at goal. Elevated LDL may be secondary to nutrition, genetics and spillover effect from excess adiposity. Recommended LDL goal is <70 to reduce the risk of fatty streaks and the progression to obstructive ASCVD in the future.   His 10 year risk is: The ASCVD Risk score (Arnett DK, et al., 2019) failed to calculate for the following reasons:   Risk score cannot be calculated because patient has a medical history suggesting prior/existing ASCVD  Lab Results  Component Value Date   CHOL 189 11/04/2023   HDL 41 11/04/2023   LDLCALC 132 (H) 11/04/2023   TRIG 88 11/04/2023   CHOLHDL 4.6 11/04/2023    Has a history of intolerance to statins he is currently on Repatha  repeat fasting lipid profile in 3 months.

## 2023-12-19 NOTE — Assessment & Plan Note (Signed)
 Most recent A1c is  Lab Results  Component Value Date   HGBA1C 5.9 08/22/2023   HGBA1C 5.9 (H) 09/30/2020    Patient aware of disease state and risk of progression. This may contribute to abnormal cravings, fatigue and diabetic complications without having diabetes.   We have discussed treatment options which include: losing 7 to 10% of body weight, increasing physical activity to a goal of 150 minutes a week at moderate intensity.  Advised to maintain a diet low on simple and processed carbohydrates.  We discussed the role and benefits of metformin and GLP-1 for pharmacoprophylaxis.  At present time he would like to hold off starting medications.

## 2023-12-19 NOTE — Assessment & Plan Note (Signed)
 Weight: decrease of 24 lb (9.2%) over 3 months, 1 week  Start: 09/11/2023 262 lb (118.8 kg)  End: 12/19/2023 238 lb (108 kg)

## 2023-12-19 NOTE — Assessment & Plan Note (Signed)
 On CPAP with reported good compliance. Continue PAP therapy. Losing 15% or more of body weight may improve AHI.

## 2023-12-24 ENCOUNTER — Telehealth (INDEPENDENT_AMBULATORY_CARE_PROVIDER_SITE_OTHER): Payer: Self-pay | Admitting: Internal Medicine

## 2023-12-24 NOTE — Telephone Encounter (Signed)
 Good morning!  He said his blood work was coded in a way that his insurance will not cover. It has been coded as M4616 - Other fatigue and [phone] - Other specified health status. The insurance company's response is that Services are considered investigational or experimental and not covered.  He is requesting for the work to be recoded and resubmited if possible so that he does not have to pay out of pocket.   He would prefer a phone call but will settle for a mychart message with a solution.   Thanks!

## 2023-12-31 NOTE — Telephone Encounter (Signed)
 The patient is calling again regarding the previous message, can he be given a call back

## 2024-01-01 ENCOUNTER — Other Ambulatory Visit (HOSPITAL_BASED_OUTPATIENT_CLINIC_OR_DEPARTMENT_OTHER): Payer: Self-pay

## 2024-01-01 MED ORDER — FLUZONE 0.5 ML IM SUSY
0.5000 mL | PREFILLED_SYRINGE | Freq: Once | INTRAMUSCULAR | 0 refills | Status: AC
  Filled 2024-01-01: qty 0.5, 1d supply, fill #0

## 2024-01-02 NOTE — Telephone Encounter (Signed)
 Called and spoke with patient, he stated he received a bill for $200 from [facility] due to coding. He stated he called and spoke with his insurance company and they told him the claim was denied for that reason.

## 2024-01-02 NOTE — Telephone Encounter (Signed)
 Called [facility] and gave them additional codes for patient. It could take up to 30 days for claims to be completed if any additional information/cost they will alert the patient with a new invoice.

## 2024-01-13 NOTE — Progress Notes (Signed)
 f    Cardiology Office Note   Date:  01/20/2024   ID:  Keith Franklin, DOB 1960/07/14, MRN [id number]  PCP:  Kathrene Mardy HERO, PA-C  Cardiologist:   Kemaria Dedic [place], MD   Chief Complaint  Patient presents with   Coronary Artery Disease   Congestive Heart Failure      History of Present Illness: Keith Franklin is a 63 y.o. male who presents for follow up CAD. He presented to [facility] in June 2022 with chest pain and was ruled in for ST elevated myocardial infarction.  He was taken emergently to the cardiac Cath Lab which showed a totally occluded circumflex.  We were able to reestablish flow in  the circumflex with POBA.  However, he was also noted to have severe LAD diagonal disease with moderate residual circumflex disease.  CABG was recommended.   He underwent CABG x3 on 10/03/2020.  While his lines were being placed and he was given sedation he had a bradycardic event with pulseless electrical activity cardiac arrest.  CPR was initiated.  It was performed for less than 2 minutes.  He had return of spontaneous circulation and was taken emergently to the operating room.    He received a LIMA-LAD, SVG to first diagonal and third obtuse marginal.   He was noted to have occasional monitor desaturations at night.  He was felt to potentially have OSA.  He continued to recover well.  He was discharged home in stable condition with his wife who is a retired Charity fundraiser his daughter who is a Engineer, site on 10/07/2020.   He was seen in follow up in June and September.  Repeat Echo in October 2022 showed EF 40-45%. Progressing well. Noted insomnia. Sleep study planned. He was  seen by urology for hematuria and underwent cystoscopy and urethral dilation in December.   He has home sleep study showed moderate OSA. Recommended auto PAP titration at home. Now followed by Dr Buck with good compliance noted on follow up in March.   He did report some recent palpitations due to PVCs. Reviewed tracings  on Apple Watch. We recommended increasing his Toprol  XL.   He presented to the ED on 03/13/2022 with nonexertional chest pressure.  Troponin was negative x 2, EKG was unchanged from prior (T wave inversion in aVL), chest x-ray was unremarkable.  His symptoms resolved and he was discharged home in stable condition and advised to follow-up with cardiology as an outpatient.  Repeat echocardiogram in 03/2022 showed EF 40 to 45%, mildly decreased LV function, normal RV systolic function, mild mitral valve regurgitation, no evidence of aortic dilation.  He was last seen in the office on 11/22/2022 and was stable from a cardiac standpoint.  He denied any symptoms concerning for angina though he did note intermittent palpitations.  He was advised to take an additional 25 mg of metoprolol  tartrate as needed for palpitations. He was last seen in Nov 2024 and doing well. Echo done and reported EF was lower but this was felt to be unchanged from priors.   He is working out regularly at [employer] 5 days a week doing 60 minutes of aerobics and  strength training. Denies any chest pain or SOB. [facility] is [facility]. Really eating very healthy. Reports a 15 lb weight loss.  He did develop symptoms of muscle fatigue and weakness. It was a struggle to finish his work outs. Was seen by PCP and labs showed elevated CK so Crestor   was stopped. He his now on Repatha  for the past 3 months. He is tolerating Entresto  OK. BP has been good. Notes palpitations and skipped beats have gone away.   Past Medical History:  Diagnosis Date   Cancer (HCC) 2010   skin cancer, Basal Cell Carcinoma    Chest pain    COVID-19 07/2019   GERD (gastroesophageal reflux disease)    History of kidney stones    Myocardial infarction (HCC) 09/30/2020   Obesity    Prediabetes    PVC (premature ventricular contraction)    Sleep apnea 10/05/2020   Using CPap has eliminated Apnea    Past Surgical History:  Procedure Laterality  Date   CORONARY ARTERY BYPASS GRAFT N/A 10/03/2020   Procedure: CORONARY ARTERY BYPASS GRAFTING (CABG) X THREE, ON PUMP, USING LEFT INTERNAL MAMMARY ARTERY AND RIGHT GREATER SAPHENOUS ENDOSCOPIC VEIN HARVEST CONDUITS;  Surgeon: Kerrin Elspeth BROCKS, MD;  Location: [facility] OR;  Service: Open Heart Surgery;  Laterality: N/A;  will harvest left radial artery for conduit Swan   CORONARY/GRAFT ACUTE MI REVASCULARIZATION N/A 09/30/2020   Procedure: Coronary/Graft Acute MI Revascularization;  Surgeon: [place], Alinna Siple M, MD;  Location: [facility] INVASIVE CV LAB;  Service: Cardiovascular;  Laterality: N/A;   CYSTOSCOPY WITH URETHRAL DILATATION N/A 04/10/2021   Procedure: CYSTOSCOPY WITH OPTILUME URETHRAL DILATATION;  Surgeon: Carolee Sherwood JONETTA DOUGLAS, MD;  Location: [facility] ORS;  Service: Urology;  Laterality: N/A;   LEFT HEART CATH AND CORONARY ANGIOGRAPHY N/A 09/30/2020   Procedure: LEFT HEART CATH AND CORONARY ANGIOGRAPHY;  Surgeon: [place], Pratik Dalziel M, MD;  Location: [facility] INVASIVE CV LAB;  Service: Cardiovascular;  Laterality: N/A;   skin cancer removal     TONSILLECTOMY     WISDOM TOOTH EXTRACTION       Current Outpatient Medications  Medication Sig Dispense Refill   acetaminophen  (TYLENOL ) 500 MG tablet Take 1,000 mg by mouth every 6 (six) hours as needed for moderate pain.     aspirin  EC 81 MG EC tablet Take 1 tablet (81 mg total) by mouth daily. Swallow whole.     Cholecalciferol (VITAMIN D3) 50 MCG (2000 UT) capsule Take 1 capsule (2,000 Units total) by mouth daily.     diphenhydramine -acetaminophen  (TYLENOL  PM) 25-500 MG TABS tablet Take 1 tablet by mouth at bedtime.     Evolocumab  (REPATHA  SURECLICK) 140 MG/ML SOAJ Inject 140 mg into the skin every 14 (fourteen) days. 6 mL 3   Famotidine  (PEPCID  PO) Take 1 tablet by mouth daily at 6 (six) AM.     METOPROLOL  SUCCINATE PO Take 25 mg by mouth daily.     metroNIDAZOLE  (METROCREAM ) 0.75 % cream Apply topically 2 (two) times daily. 45 g 2   sacubitril -valsartan  (ENTRESTO )  24-26 MG Take 1 tablet by mouth 2 (two) times daily.     tamsulosin (FLOMAX) 0.4 MG CAPS capsule Take 0.4 mg by mouth daily.     nitroGLYCERIN  (NITROSTAT ) 0.4 MG SL tablet Place 0.4 mg under the tongue every 5 (five) minutes as needed for chest pain. (Patient not taking: Reported on 01/20/2024)     No current facility-administered medications for this visit.    Allergies:   Patient has no known allergies.    Social History:  The patient  reports that he has never smoked. He has never used smokeless tobacco. He reports that he does not currently use alcohol. He reports that he does not use drugs.   Family History:  The patient's family history includes Cancer in his mother; Heart attack  in his father; Heart disease in his father; Sleep apnea in his father.    ROS:  Please see the history of present illness.   Otherwise, review of systems are positive for none.   All other systems are reviewed and negative.    PHYSICAL EXAM: VS:  BP 94/64 (BP Location: Left Arm, Patient Position: Sitting, Cuff Size: Large)   Pulse 63   Ht 5' 7 (1.702 m)   Wt 239 lb 6.4 oz (108.6 kg)   SpO2 97%   BMI 37.50 kg/m  , BMI Body mass index is 37.5 kg/m. GEN: Well nourished, obese, in no acute distress HEENT: normal Neck: no JVD, carotid bruits, or masses Cardiac: RRR; no murmurs, rubs, or gallops,no edema  Respiratory:  clear to auscultation bilaterally, normal work of breathing Chest with median sternotomy scar with diffuse keloid GI: soft, nontender, nondistended, + BS MS: no deformity or atrophy Skin: warm and dry, no rash Neuro:  Strength and sensation are intact Psych: euthymic mood, full affect         Recent Labs: 08/22/2023: TSH 2.59 10/17/2023: Hemoglobin 14.8; Magnesium  2.1; Platelets 245.0 11/04/2023: ALT 18; BUN 17; Creatinine, Ser 1.11; Potassium 4.5; Sodium 140    Lipid Panel    Component Value Date/Time   CHOL 189 11/04/2023 0917   TRIG 88 11/04/2023 0917   HDL 41 11/04/2023  0917   CHOLHDL 4.6 11/04/2023 0917   CHOLHDL 3 08/22/2023 1024   VLDL 13.0 08/22/2023 1024   LDLCALC 132 (H) 11/04/2023 0917   LDLCALC 32 01/13/2021 1516      Wt Readings from Last 3 Encounters:  01/20/24 239 lb 6.4 oz (108.6 kg)  12/19/23 238 lb (108 kg)  11/25/23 244 lb (110.7 kg)      Other studies Reviewed: Additional studies/ records that were reviewed today include:   Echocardiogram 10/01/2020 IMPRESSIONS     1. Left ventricular ejection fraction, by estimation, is 45 to 50%. The  left ventricle has mildly decreased function. The left ventricle has no  regional wall motion abnormalities. There is mild concentric left  ventricular hypertrophy. Left ventricular  diastolic parameters are consistent with Grade I diastolic dysfunction  (impaired relaxation). There is moderate hypokinesis of the left  ventricular, mid-apical anterior wall. There is moderate hypokinesis of  the left ventricular, entire inferolateral  wall.   2. Right ventricular systolic function is normal. The right ventricular  size is normal.   3. Left atrial size was mildly dilated.   4. The mitral valve is normal in structure. No evidence of mitral valve  regurgitation. No evidence of mitral stenosis.   5. The aortic valve is tricuspid. Aortic valve regurgitation is not  visualized. No aortic stenosis is present.   6. There is mild dilatation of the ascending aorta, measuring 42 mm.   7. The inferior vena cava is normal in size with greater than 50%  respiratory variability, suggesting right atrial pressure of 3 mmHg.      Cardiac catheterization 10/27/2020 Prox LAD to Mid LAD lesion is 90% stenosed. Mid LAD lesion is 90% stenosed. 1st Diag lesion is 40% stenosed. Mid Cx to Dist Cx lesion is 100% stenosed. RV Branch lesion is 90% stenosed. 2nd RPL lesion is 70% stenosed. The left ventricular systolic function is normal. LV end diastolic pressure is normal. The left ventricular ejection fraction  is 50-55% by visual estimate. Post intervention, there is a 40% residual stenosis. Balloon angioplasty was performed using a BALLOON SAPPHIRE 2.5X12.   1.  Severe 2 vessel obstructive CAD.    There is severe diffuse segmental disease in the proximal to mid LAD 90%    100% occlusion of the LCx distally with thrombus 2. EF 50-55% with mid inferior HK 3. Normal LV EDP 4. Successful PCI of the distal LCx with POBA.   Plan: will resume IV heparin  in 8 hours. Continue IV Aggrastat . Initiate beta blocker and high dose statin. Continue IV Ntg. Will consult CT surgery for CABG this admission given severe LAD disease. The LAD and OM 2 and 3 appear to be acceptable targets.   Diagnostic Dominance: Right     Intervention         Echo 02/15/21: IMPRESSIONS     1. Left ventricular ejection fraction, by estimation, is 40 to 45%. The  left ventricle has mildly decreased function. The left ventricle  demonstrates global hypokinesis. Left ventricular diastolic parameters are  consistent with Grade II diastolic  dysfunction (pseudonormalization). The average left ventricular global  longitudinal strain is -11.6 %. The global longitudinal strain is  abnormal.   2. Right ventricular systolic function is mildly reduced. The right  ventricular size is moderately enlarged.   3. Left atrial size was mildly dilated.   4. Right atrial size was moderately dilated.   5. The mitral valve is normal in structure. Trivial mitral valve  regurgitation. No evidence of mitral stenosis.   6. The aortic valve is normal in structure. Aortic valve regurgitation is  not visualized. No aortic stenosis is present.   7. Aortic dilatation noted. There is mild dilatation of the aortic root,  measuring 40 mm. There is mild dilatation of the ascending aorta,  measuring 41 mm.   Comparison(s): EF 45-50%, mild concentric LVH, moderate hypokinesis of LV,  mid-apical anterior wall, entire inferolateral wall. Ascending aorta  42mm.   Echo 03/22/23: IMPRESSIONS     1. Very difficult study. LVEF 35-40%, cannot determine if WMA are  present.   2. Left ventricular ejection fraction, by estimation, is 35 to 40%. The  left ventricle has moderately decreased function. Left ventricular  endocardial border not optimally defined to evaluate regional wall motion.  Left ventricular diastolic parameters  are consistent with Grade II diastolic dysfunction (pseudonormalization).   3. Right ventricular systolic function is mildly reduced. The right  ventricular size is mildly enlarged. Tricuspid regurgitation signal is  inadequate for assessing PA pressure.   4. Left atrial size was mildly dilated.   5. The mitral valve is grossly normal. Trivial mitral valve  regurgitation. No evidence of mitral stenosis.   6. The aortic valve is tricuspid. Aortic valve regurgitation is not  visualized. No aortic stenosis is present.   Comparison(s): No significant change from prior study.   ASSESSMENT AND PLAN:  1.  Coronary artery disease- - admitted with STEMI with emergent POBA of the LCx - Status post CABG x3 10/03/2020.  Had episodes of PEA arrest prior to CABG.   - asymptomatic - Continue aspirin , metoprolol  -Heart healthy low-sodium diet - not on statin due to myopathy   2. Hyperlipidemia- LDL  with statin induced myopathy with elevated CK. Now on  Repatha . Follow up labs with PCP next week   3. Essential hypertension  Well-controlled  Continue  metoprolol /Entresto  Heart healthy low-sodium diet Continue aerobic physical activity    4. OSA-moderate by in home sleep study Continue weight loss  Continue CPAP   5. Urethral stricture. S/p dilation.   6. Moderate LV dysfunction. On Toprol  and Entresto .  Titration limited by low BP. Will repeat Echo prior to next visit. Consider SGLT 2 inhibitor if still low    Disposition:   FU in 6 months  Signed, Boe Deans [place], MD  01/20/2024 10:50 AM    [facility]  [facility] [address] [place], [place], [address] Phone [phone], Fax [phone]

## 2024-01-17 ENCOUNTER — Other Ambulatory Visit (HOSPITAL_BASED_OUTPATIENT_CLINIC_OR_DEPARTMENT_OTHER): Payer: Self-pay

## 2024-01-20 ENCOUNTER — Ambulatory Visit: Attending: Cardiology | Admitting: Cardiology

## 2024-01-20 ENCOUNTER — Encounter: Payer: Self-pay | Admitting: Cardiology

## 2024-01-20 VITALS — BP 94/64 | HR 63 | Ht 67.0 in | Wt 239.4 lb

## 2024-01-20 DIAGNOSIS — I2510 Atherosclerotic heart disease of native coronary artery without angina pectoris: Secondary | ICD-10-CM | POA: Diagnosis not present

## 2024-01-20 DIAGNOSIS — Z951 Presence of aortocoronary bypass graft: Secondary | ICD-10-CM

## 2024-01-20 DIAGNOSIS — G4733 Obstructive sleep apnea (adult) (pediatric): Secondary | ICD-10-CM

## 2024-01-20 DIAGNOSIS — E7800 Pure hypercholesterolemia, unspecified: Secondary | ICD-10-CM

## 2024-01-20 DIAGNOSIS — I255 Ischemic cardiomyopathy: Secondary | ICD-10-CM

## 2024-01-20 NOTE — Patient Instructions (Signed)
 Medication Instructions:  Continue same medications *If you need a refill on your cardiac medications before your next appointment, please call your pharmacy*  Lab Work: None ordered  Testing/Procedures: Echo  schedule in 06/2024  Follow-Up: At [facility], you and your health needs are our priority.  As part of our continuing mission to provide you with exceptional heart care, our providers are all part of one team.  This team includes your primary Cardiologist (physician) and Advanced Practice Providers or APPs (Physician Assistants and Nurse Practitioners) who all work together to provide you with the care you need, when you need it.  Your next appointment:  Call in Nov to schedule March appointment     Provider:  Dr.Jordan   We recommend signing up for the patient portal called MyChart.  Sign up information is provided on this After Visit Summary.  MyChart is used to connect with patients for Virtual Visits (Telemedicine).  Patients are able to view lab/test results, encounter notes, upcoming appointments, etc.  Non-urgent messages can be sent to your provider as well.   To learn more about what you can do with MyChart, go to [email or web address].

## 2024-01-21 ENCOUNTER — Encounter (INDEPENDENT_AMBULATORY_CARE_PROVIDER_SITE_OTHER): Payer: Self-pay | Admitting: Internal Medicine

## 2024-01-21 ENCOUNTER — Ambulatory Visit (INDEPENDENT_AMBULATORY_CARE_PROVIDER_SITE_OTHER): Payer: PRIVATE HEALTH INSURANCE | Admitting: Internal Medicine

## 2024-01-21 VITALS — BP 123/75 | HR 64 | Temp 98.2°F | Ht 67.0 in | Wt 236.0 lb

## 2024-01-21 DIAGNOSIS — E66812 Obesity, class 2: Secondary | ICD-10-CM

## 2024-01-21 DIAGNOSIS — E7800 Pure hypercholesterolemia, unspecified: Secondary | ICD-10-CM | POA: Diagnosis not present

## 2024-01-21 DIAGNOSIS — R7303 Prediabetes: Secondary | ICD-10-CM

## 2024-01-21 DIAGNOSIS — E88819 Insulin resistance, unspecified: Secondary | ICD-10-CM

## 2024-01-21 DIAGNOSIS — Z6836 Body mass index (BMI) 36.0-36.9, adult: Secondary | ICD-10-CM

## 2024-01-21 DIAGNOSIS — G4733 Obstructive sleep apnea (adult) (pediatric): Secondary | ICD-10-CM | POA: Diagnosis not present

## 2024-01-21 DIAGNOSIS — I2510 Atherosclerotic heart disease of native coronary artery without angina pectoris: Secondary | ICD-10-CM | POA: Diagnosis not present

## 2024-01-21 NOTE — Assessment & Plan Note (Signed)
 His HOMA-IR is 7.56 which is elevated. Optimal level < 1.9.   This is complex condition associated with genetics, ectopic fat and lifestyle factors. Insulin  resistance may result in increased fat storage, inhibition of the breakdown of fat, cause fluctuations in blood sugar leading to energy crashes and increased cravings for sugary or high carb foods and cause metabolic slowdown making it difficult to lose weight.  This may result in additional weight gain and lead to pre-diabetes and diabetes if untreated. In addition, hyperinsulinemia increases cardiovascular risk, chronic inflammatory response and may increase the risk of obesity related malignancies.  Lab Results  Component Value Date   HGBA1C 5.9 08/22/2023   Lab Results  Component Value Date   INSULIN  29.4 (H) 11/11/2023   Lab Results  Component Value Date   GLUCOSE 101 (H) 11/04/2023   GLUCOSE 173 (H) 09/30/2020    We reviewed treatment options which include losing 7 to 10% of body weight, increasing volume of physical activity and maintaining a diet low in saturated fats and with a low glycemic load.  Patient has also been educated on the carb insulin  model of obesity.  We had discussed pharmacoprophylaxis in the past at present time he would like to continue with nutritional and behavioral strategies.

## 2024-01-21 NOTE — Progress Notes (Signed)
 Office: [phone]  /  Fax: [phone]  Weight Summary and Body Composition Analysis (BIA)  Vitals Temp: 98.2 F (36.8 C) BP: 123/75 Pulse Rate: 64 SpO2: 97 %   Anthropometric Measurements Height: 5' 7 (1.702 m) Weight: 236 lb (107 kg) BMI (Calculated): 36.95 Weight at Last Visit: 238 lb Weight Lost Since Last Visit: 2 lb Weight Gained Since Last Visit: 0 lb Starting Weight: 252 lb Total Weight Loss (lbs): 16 lb (7.258 kg) Peak Weight: 262 lb   Body Composition  Body Fat %: 31.1 % Fat Mass (lbs): 73.4 lbs Muscle Mass (lbs): 154.6 lbs Total Body Water  (lbs): 112.2 lbs Visceral Fat Rating : 20    RMR: 2045  Today's Visit #: 4  Starting Date: 11/11/23   Subjective   Chief Complaint: Obesity  Interval History Discussed the use of AI scribe software for clinical note transcription with the patient, who gave verbal consent to proceed.  History of Present Illness Keith Franklin is a 63 year old male who presents for medical weight management.  He follows a 1500 calorie nutrition plan approximately 90% of the time, focusing on whole foods, adequate protein, and hydration. He ensures adequate sleep and exercises five days a week for 60 minutes, concentrating on cardio and stretching. Over the past four months, he has lost 26 pounds, approximately 10% of his body weight, and notes looser clothing, particularly around the midsection.  He experiences challenges with hunger between meals, especially when using the [place] app to track macros, as foods with good macros tend to be higher in calories. To manage hunger, he makes peanut butter oat protein balls but is mindful of his calorie content. He is considering adjusting his pre and post-workout nutrition to better manage hunger and energy levels and is exploring options for low-calorie, high-protein snacks and bars.  He is currently on Entresto  and was recently advised to stop metoprolol  due to low blood  pressure readings, which were as low as 94/54. Metoprolol  made him feel fatigued and contributed to weight gain. His current blood pressure is 123/25, and he reports adequate hydration.     Challenges affecting patient progress: none.    Pharmacotherapy for weight management: He is currently taking no anti-obesity medication.   Assessment and Plan   Treatment Plan For Obesity:  Recommended Dietary Goals  Keith Franklin is currently in the action stage of change. As such, his goal is to continue weight management plan. He has agreed to: continue current plan  Behavioral Health and Counseling  We discussed the following behavioral modification strategies today: continue to work on maintaining a reduced calorie state, getting the recommended amount of protein, incorporating whole foods, making healthy choices, staying well hydrated and practicing mindfulness when eating. and increase protein intake, fibrous foods (25 grams per day for women, 30 grams for men) and water  to improve satiety and decrease hunger signals. .  Additional education and resources provided today: Handout on popular protein drinks and bars  Recommended Physical Activity Goals  Keith Franklin has been advised to work up to 150 minutes of moderate intensity aerobic activity a week and strengthening exercises 2-3 times per week for cardiovascular health, weight loss maintenance and preservation of muscle mass.  He has agreed to :  Continue current level of physical activity  and Discussed changing exercise routine to avoid adaptation plateau or training plateau  Medical Interventions and Pharmacotherapy  We discussed various medication options to help Keith Franklin with his weight loss efforts and we both agreed to :  Continue with current nutritional and behavioral strategies  Associated Conditions Impacted by Obesity Treatment  Assessment & Plan OSA (obstructive sleep apnea) - moderate,  AHI 16, PSMG 2022 On CPAP with reported  good compliance. Continue PAP therapy.  He has lost approximately 10% of total body weight.  Losing 15% may reduce AHI.  Continue current weight management strategy.  He may also benefit from treatment with GLP-1 in the future.  Prediabetes Insulin  resistance His HOMA-IR is 7.56 which is elevated. Optimal level < 1.9.   This is complex condition associated with genetics, ectopic fat and lifestyle factors. Insulin  resistance may result in increased fat storage, inhibition of the breakdown of fat, cause fluctuations in blood sugar leading to energy crashes and increased cravings for sugary or high carb foods and cause metabolic slowdown making it difficult to lose weight.  This may result in additional weight gain and lead to pre-diabetes and diabetes if untreated. In addition, hyperinsulinemia increases cardiovascular risk, chronic inflammatory response and may increase the risk of obesity related malignancies.  Lab Results  Component Value Date   HGBA1C 5.9 08/22/2023   Lab Results  Component Value Date   INSULIN  29.4 (H) 11/11/2023   Lab Results  Component Value Date   GLUCOSE 101 (H) 11/04/2023   GLUCOSE 173 (H) 09/30/2020    We reviewed treatment options which include losing 7 to 10% of body weight, increasing volume of physical activity and maintaining a diet low in saturated fats and with a low glycemic load.  Patient has also been educated on the carb insulin  model of obesity.  We had discussed pharmacoprophylaxis in the past at present time he would like to continue with nutritional and behavioral strategies.  Hypercholesteremia LDL is not at goal. Elevated LDL may be secondary to nutrition, genetics and spillover effect from excess adiposity. Recommended LDL goal is <70 to reduce the risk of fatty streaks and the progression to obstructive ASCVD in the future.   His 10 year risk is: The ASCVD Risk score (Arnett DK, et al., 2019) failed to calculate for the following reasons:    Risk score cannot be calculated because patient has a medical history suggesting prior/existing ASCVD  Lab Results  Component Value Date   CHOL 189 11/04/2023   HDL 41 11/04/2023   LDLCALC 132 (H) 11/04/2023   TRIG 88 11/04/2023   CHOLHDL 4.6 11/04/2023    Has a history of intolerance to statins he is currently on Repatha  repeat fasting lipid profile in 3 months.  Class 2 severe obesity with serious comorbidity and body mass index (BMI) of 36.0 to 36.9 in adult, unspecified obesity type Weight: decrease of 26 lb (9.9%) over 4 months, 1 week  Start: 09/11/2023 262 lb (118.8 kg)  End: 01/21/2024 236 lb (107 kg)  Dempsey has been adhering to a 1500 calorie nutrition plan 90% of the time, focusing on whole foods, adequate protein, and hydration. He exercises five days a week, incorporating cardio and stretching. He has lost 26 pounds over four months, approximately 10% of his body weight, with improved body composition, decreased visceral fat, and reduced body fat percentage. He is not on weight loss medications but reports hunger challenges between meals and is exploring options like peanut butter oat protein balls. Emphasized gradual weight loss to prevent muscle loss and the importance of listening to hunger cues, especially on workout days. - Continue 1500 calorie nutrition plan with focus on whole foods, adequate protein, and hydration. - Encourage exercise routine of five  days a week with cardio and stretching. - Consider using PB2 for lower calorie peanut butter options. - Adjust protein ball size to maintain around 150 calories per ball. - Explore protein bars with good protein to calorie ratio. - Encourage listening to hunger cues and adjusting intake on workout days. - Consider pre-workout meal with carbs and protein, and post-workout recovery meal. - Reassess in two months to discuss progress and strategies for the holidays. Atherosclerosis of native coronary artery of native heart  without angina pectoris His blood pressure is well-controlled.  He is currently on aspirin  and Repatha .  He has statin intolerance and is also on Entresto .  His metoprolol  was recently discontinued by cardiology because of low blood pressure.  There are no signs or symptoms of angina.  And he is exercising approximately 300 minutes a week.   Weight: decrease of 26 lb (9.9%) over 4 months, 1 week  Start: 09/11/2023 262 lb (118.8 kg)  End: 01/21/2024 236 lb (107 kg)      Objective   Physical Exam:  Blood pressure 123/75, pulse 64, temperature 98.2 F (36.8 C), height 5' 7 (1.702 m), weight 236 lb (107 kg), SpO2 97%. Body mass index is 36.96 kg/m.  General: He is overweight, cooperative, alert, well developed, and in no acute distress. PSYCH: Has normal mood, affect and thought process.   HEENT: EOMI, sclerae are anicteric. Lungs: Normal breathing effort, no conversational dyspnea. Extremities: No edema.  Neurologic: No gross sensory or motor deficits. No tremors or fasciculations noted.    Diagnostic Data Reviewed:  BMET    Component Value Date/Time   NA 140 11/04/2023 0917   K 4.5 11/04/2023 0917   CL 103 11/04/2023 0917   CO2 20 11/04/2023 0917   GLUCOSE 101 (H) 11/04/2023 0917   GLUCOSE 108 (H) 10/17/2023 1554   BUN 17 11/04/2023 0917   CREATININE 1.11 11/04/2023 0917   CALCIUM  9.4 11/04/2023 0917   GFRNONAA >60 03/13/2022 1623   Lab Results  Component Value Date   HGBA1C 5.9 08/22/2023   HGBA1C 5.9 (H) 09/30/2020   Lab Results  Component Value Date   INSULIN  29.4 (H) 11/11/2023   Lab Results  Component Value Date   TSH 2.59 08/22/2023   CBC    Component Value Date/Time   WBC 8.5 10/17/2023 1554   RBC 4.77 10/17/2023 1554   HGB 14.8 10/17/2023 1554   HGB 15.6 08/21/2022 0940   HCT 44.2 10/17/2023 1554   HCT 46.7 08/21/2022 0940   PLT 245.0 10/17/2023 1554   PLT 239 08/21/2022 0940   MCV 92.8 10/17/2023 1554   MCV 95 08/21/2022 0940   MCH 31.6  08/21/2022 0940   MCH 31.7 03/13/2022 1623   MCHC 33.5 10/17/2023 1554   RDW 13.4 10/17/2023 1554   RDW 12.5 08/21/2022 0940   Iron Studies No results found for: IRON, TIBC, FERRITIN, IRONPCTSAT Lipid Panel     Component Value Date/Time   CHOL 189 11/04/2023 0917   TRIG 88 11/04/2023 0917   HDL 41 11/04/2023 0917   CHOLHDL 4.6 11/04/2023 0917   CHOLHDL 3 08/22/2023 1024   VLDL 13.0 08/22/2023 1024   LDLCALC 132 (H) 11/04/2023 0917   LDLCALC 32 01/13/2021 1516   Hepatic Function Panel     Component Value Date/Time   PROT 6.5 11/04/2023 0917   ALBUMIN  4.2 11/04/2023 0917   AST 18 11/04/2023 0917   ALT 18 11/04/2023 0917   ALKPHOS 66 11/04/2023 0917   BILITOT 0.6 11/04/2023  9082   BILIDIR 0.20 11/20/2022 0838   IBILI 0.4 09/30/2020 1525      Component Value Date/Time   TSH 2.59 08/22/2023 1024   Nutritional Lab Results  Component Value Date   VD25OH 22.9 (L) 11/11/2023    Medications: Outpatient Encounter Medications as of 01/21/2024  Medication Sig Note   acetaminophen  (TYLENOL ) 500 MG tablet Take 1,000 mg by mouth every 6 (six) hours as needed for moderate pain.    aspirin  EC 81 MG EC tablet Take 1 tablet (81 mg total) by mouth daily. Swallow whole.    Cholecalciferol (VITAMIN D3) 50 MCG (2000 UT) capsule Take 1 capsule (2,000 Units total) by mouth daily.    diphenhydramine -acetaminophen  (TYLENOL  PM) 25-500 MG TABS tablet Take 1 tablet by mouth at bedtime. 09/11/2023: On hold   Evolocumab  (REPATHA  SURECLICK) 140 MG/ML SOAJ Inject 140 mg into the skin every 14 (fourteen) days.    Famotidine  (PEPCID  PO) Take 1 tablet by mouth daily at 6 (six) AM.    METOPROLOL  SUCCINATE PO Take 25 mg by mouth daily.    metroNIDAZOLE  (METROCREAM ) 0.75 % cream Apply topically 2 (two) times daily.    nitroGLYCERIN  (NITROSTAT ) 0.4 MG SL tablet Place 0.4 mg under the tongue every 5 (five) minutes as needed for chest pain. (Patient not taking: Reported on 01/21/2024) 01/20/2024: Has if  needed   sacubitril -valsartan  (ENTRESTO ) 24-26 MG Take 1 tablet by mouth 2 (two) times daily.    tamsulosin (FLOMAX) 0.4 MG CAPS capsule Take 0.4 mg by mouth daily.    No facility-administered encounter medications on file as of 01/21/2024.     Follow-Up   Return in about 2 months (around 03/22/2024) for For Weight Mangement with Dr. Francyne.SABRA He was informed of the importance of frequent follow up visits to maximize his success with intensive lifestyle modifications for his multiple health conditions.  Attestation Statement   Reviewed by clinician on day of visit: allergies, medications, problem list, medical history, surgical history, family history, social history, and previous encounter notes.     Lucas Francyne, MD

## 2024-01-21 NOTE — Assessment & Plan Note (Signed)
 LDL is not at goal. Elevated LDL may be secondary to nutrition, genetics and spillover effect from excess adiposity. Recommended LDL goal is <70 to reduce the risk of fatty streaks and the progression to obstructive ASCVD in the future.   His 10 year risk is: The ASCVD Risk score (Arnett DK, et al., 2019) failed to calculate for the following reasons:   Risk score cannot be calculated because patient has a medical history suggesting prior/existing ASCVD  Lab Results  Component Value Date   CHOL 189 11/04/2023   HDL 41 11/04/2023   LDLCALC 132 (H) 11/04/2023   TRIG 88 11/04/2023   CHOLHDL 4.6 11/04/2023    Has a history of intolerance to statins he is currently on Repatha  repeat fasting lipid profile in 3 months.

## 2024-01-21 NOTE — Assessment & Plan Note (Signed)
 On CPAP with reported good compliance. Continue PAP therapy.  He has lost approximately 10% of total body weight.  Losing 15% may reduce AHI.  Continue current weight management strategy.  He may also benefit from treatment with GLP-1 in the future.

## 2024-01-21 NOTE — Assessment & Plan Note (Signed)
 His blood pressure is well-controlled.  He is currently on aspirin  and Repatha .  He has statin intolerance and is also on Entresto .  His metoprolol  was recently discontinued by cardiology because of low blood pressure.  There are no signs or symptoms of angina.  And he is exercising approximately 300 minutes a week.

## 2024-01-21 NOTE — Assessment & Plan Note (Signed)
 Weight: decrease of 26 lb (9.9%) over 4 months, 1 week  Start: 09/11/2023 262 lb (118.8 kg)  End: 01/21/2024 236 lb (107 kg)  Dempsey has been adhering to a 1500 calorie nutrition plan 90% of the time, focusing on whole foods, adequate protein, and hydration. He exercises five days a week, incorporating cardio and stretching. He has lost 26 pounds over four months, approximately 10% of his body weight, with improved body composition, decreased visceral fat, and reduced body fat percentage. He is not on weight loss medications but reports hunger challenges between meals and is exploring options like peanut butter oat protein balls. Emphasized gradual weight loss to prevent muscle loss and the importance of listening to hunger cues, especially on workout days. - Continue 1500 calorie nutrition plan with focus on whole foods, adequate protein, and hydration. - Encourage exercise routine of five days a week with cardio and stretching. - Consider using PB2 for lower calorie peanut butter options. - Adjust protein ball size to maintain around 150 calories per ball. - Explore protein bars with good protein to calorie ratio. - Encourage listening to hunger cues and adjusting intake on workout days. - Consider pre-workout meal with carbs and protein, and post-workout recovery meal. - Reassess in two months to discuss progress and strategies for the holidays.

## 2024-01-22 ENCOUNTER — Ambulatory Visit: Admitting: Physician Assistant

## 2024-01-22 ENCOUNTER — Encounter: Payer: Self-pay | Admitting: Physician Assistant

## 2024-01-22 VITALS — BP 100/58 | HR 70 | Temp 97.2°F | Ht 67.0 in | Wt 237.8 lb

## 2024-01-22 DIAGNOSIS — E66812 Obesity, class 2: Secondary | ICD-10-CM

## 2024-01-22 DIAGNOSIS — E661 Drug-induced obesity: Secondary | ICD-10-CM

## 2024-01-22 DIAGNOSIS — R7303 Prediabetes: Secondary | ICD-10-CM

## 2024-01-22 DIAGNOSIS — G4733 Obstructive sleep apnea (adult) (pediatric): Secondary | ICD-10-CM | POA: Diagnosis not present

## 2024-01-22 DIAGNOSIS — Z951 Presence of aortocoronary bypass graft: Secondary | ICD-10-CM

## 2024-01-22 DIAGNOSIS — E88819 Insulin resistance, unspecified: Secondary | ICD-10-CM

## 2024-01-22 DIAGNOSIS — E7800 Pure hypercholesterolemia, unspecified: Secondary | ICD-10-CM

## 2024-01-22 DIAGNOSIS — Z6837 Body mass index (BMI) 37.0-37.9, adult: Secondary | ICD-10-CM

## 2024-01-22 DIAGNOSIS — I10 Essential (primary) hypertension: Secondary | ICD-10-CM

## 2024-01-22 DIAGNOSIS — N400 Enlarged prostate without lower urinary tract symptoms: Secondary | ICD-10-CM

## 2024-01-22 DIAGNOSIS — R5383 Other fatigue: Secondary | ICD-10-CM

## 2024-01-22 LAB — LIPID PANEL
Cholesterol: 88 mg/dL (ref 0–200)
HDL: 38.1 mg/dL — ABNORMAL LOW (ref >39.00)
LDL Cholesterol: 41 mg/dL (ref 0–99)
NonHDL: 50.23
Total CHOL/HDL Ratio: 2
Triglycerides: 44 mg/dL (ref 0.0–149.0)
VLDL: 8.8 mg/dL (ref 0.0–40.0)

## 2024-01-22 LAB — COMPREHENSIVE METABOLIC PANEL WITH GFR
ALT: 13 U/L (ref 0–53)
AST: 16 U/L (ref 0–37)
Albumin: 4.3 g/dL (ref 3.5–5.2)
Alkaline Phosphatase: 50 U/L (ref 39–117)
BUN: 20 mg/dL (ref 6–23)
CO2: 28 meq/L (ref 19–32)
Calcium: 9.6 mg/dL (ref 8.4–10.5)
Chloride: 105 meq/L (ref 96–112)
Creatinine, Ser: 1.07 mg/dL (ref 0.40–1.50)
GFR: 73.69 mL/min (ref >60.00)
Glucose, Bld: 106 mg/dL — ABNORMAL HIGH (ref 70–99)
Potassium: 4.6 meq/L (ref 3.5–5.1)
Sodium: 139 meq/L (ref 135–145)
Total Bilirubin: 0.6 mg/dL (ref 0.2–1.2)
Total Protein: 6.8 g/dL (ref 6.0–8.3)

## 2024-01-22 LAB — HEMOGLOBIN A1C: Hgb A1c MFr Bld: 5.9 % (ref 4.6–6.5)

## 2024-01-22 NOTE — Progress Notes (Signed)
 Patient ID: Keith Franklin, male    DOB: May 29, 1960, 63 y.o.   MRN: [id number]   Assessment & Plan:  Prediabetes -     Hemoglobin A1c -     Comprehensive metabolic panel with GFR  Insulin  resistance -     Hemoglobin A1c -     Comprehensive metabolic panel with GFR  Hypercholesteremia -     Lipid panel -     Comprehensive metabolic panel with GFR  OSA (obstructive sleep apnea) - moderate,  AHI 16, PSMG 2022      Assessment and Plan Assessment & Plan S/p CABGx3 -Follows with cardiology Heart failure with reduced ejection fraction, managed with Entresto  and metoprolol . Echocardiogram shows ejection fraction at 40-45%. Experiencing dizziness, likely due to hypotension from Entresto . Blood pressure readings are low, possibly due to weight loss and medication effects. No PVCs noted with current regimen. - Continue Entresto  at current dose. - Hold metoprolol  for one week and monitor symptoms. - Consider discussing with cardiology about adjusting Entresto  if symptoms persist.  Hypertension Hypertension management complicated by low blood pressure readings, likely due to weight loss and medication effects. Blood pressure as low as 90/57 mmHg, causing low energy and dizziness. Discussed importance of quality of life and potential need to adjust medications. - Monitor blood pressure regularly and report significant changes. - Consider dietary adjustments to manage blood pressure, such as increasing salt intake cautiously.  Fatigue Fatigue possibly related to low blood pressure and medication effects. Reports low energy, particularly with low blood pressure readings. - Monitor blood pressure and energy levels.  Hyperlipidemia on PCSK9 inhibitor therapy Hyperlipidemia managed with Repatha . Recent misfire with Repatha  injection noted. Liver function tests need monitoring due to recent initiation of Repatha . - Order liver function tests to monitor effects of Repatha . - Continue  Repatha  therapy and monitor for further injection issues.  Obstructive sleep apnea Obstructive sleep apnea managed with CPAP. Reports interrupted sleep and considering changing CPAP mask for better airflow. - Consider changing CPAP mask to improve airflow and comfort.  Benign prostatic hyperplasia without lower urinary tract symptoms Benign prostatic hyperplasia managed with Flomax. Previous dizziness with Flomax noted, but currently tolerating well after resuming medication. - Continue Flomax as currently prescribed.  Prediabetes with insulin  resistance Prediabetes with insulin  resistance. Considering metformin to improve insulin  sensitivity. Discussed potential benefits and side effects of metformin, including gastrointestinal upset. - Consider starting metformin, beginning with extended-release formulation and titrating dose slowly.  Obesity Obesity management with significant weight loss of 25-26 pounds through lifestyle changes, including diet and exercise. Weight loss may be contributing to lower blood pressure readings. - Continue current diet and exercise regimen.      Return in about 6 months (around 07/21/2024) for recheck/follow-up.    Subjective:    Chief Complaint  Patient presents with   Medical Management of Chronic Issues    Pt in office for follow up with PCP;pt is fasting and wanting to recheck lipids and A1C level; also CK elevated last visit and wanting to check to see how labs look after starting Repatha  and no sugar low cal diet for past 90 days; pt has lost 26 lbs; BP has been low as well    HPI Discussed the use of AI scribe software for clinical note transcription with the patient, who gave verbal consent to proceed.  History of Present Illness Keith Franklin is a 63 year old male with hypertension and coronary artery disease who presents with concerns about low  blood pressure and medication side effects.  He has been experiencing low blood  pressure since starting Entresto , with readings as low as 90/57 mmHg at home and 94/57 mmHg during a cardiologist visit. He feels 'low energy' and 'blah' when his blood pressure is low and experiences dizziness during certain exercises at the gym.  He recently stopped taking metoprolol  two days ago to address his low blood pressure, missing his last two doses. He is also on Flomax, which he had stopped temporarily due to dizziness but restarted due to urinary issues.  He has been participating in a healthy weight management program, losing 25-26 pounds from an initial weight of 262 pounds. His dietary changes include tracking calories, increasing protein, and avoiding added sugars. He exercises regularly, doing 30 minutes of cardio and 30 minutes of strength training five days a week.  He uses a CPAP machine for sleep apnea but reports interrupted sleep, waking up after four hours and taking up to two hours to fall back asleep. He uses a nasal CPAP mask and takes Tylenol  PM to aid sleep.  He has a history of a heart attack and underwent surgery over a year ago. He feels stronger than before the heart attack but has not felt as good as he did shortly after the surgery. He is currently on Repatha  for cholesterol management and has experienced a significant drop in cholesterol levels since starting it.  He has a family history of overweight siblings, with one sister having used GLP-1 medications for weight loss. He is concerned about the cost of medications and insurance coverage, as he is not yet on Medicare.     Past Medical History:  Diagnosis Date   Cancer (HCC) 2010   skin cancer, Basal Cell Carcinoma    Chest pain    COVID-19 07/2019   GERD (gastroesophageal reflux disease)    History of kidney stones    Myocardial infarction (HCC) 09/30/2020   Obesity    Pneumonia due to COVID-19 virus 08/09/2019   Prediabetes    PVC (premature ventricular contraction)    Sleep apnea 10/05/2020   Using  CPap has eliminated Apnea    Past Surgical History:  Procedure Laterality Date   CORONARY ARTERY BYPASS GRAFT N/A 10/03/2020   Procedure: CORONARY ARTERY BYPASS GRAFTING (CABG) X THREE, ON PUMP, USING LEFT INTERNAL MAMMARY ARTERY AND RIGHT GREATER SAPHENOUS ENDOSCOPIC VEIN HARVEST CONDUITS;  Surgeon: Kerrin Elspeth BROCKS, MD;  Location: [facility] OR;  Service: Open Heart Surgery;  Laterality: N/A;  will harvest left radial artery for conduit Swan   CORONARY/GRAFT ACUTE MI REVASCULARIZATION N/A 09/30/2020   Procedure: Coronary/Graft Acute MI Revascularization;  Surgeon: Swaziland, Peter M, MD;  Location: [facility] INVASIVE CV LAB;  Service: Cardiovascular;  Laterality: N/A;   CYSTOSCOPY WITH URETHRAL DILATATION N/A 04/10/2021   Procedure: CYSTOSCOPY WITH OPTILUME URETHRAL DILATATION;  Surgeon: Carolee Sherwood JONETTA DOUGLAS, MD;  Location: [facility] ORS;  Service: Urology;  Laterality: N/A;   LEFT HEART CATH AND CORONARY ANGIOGRAPHY N/A 09/30/2020   Procedure: LEFT HEART CATH AND CORONARY ANGIOGRAPHY;  Surgeon: Swaziland, Peter M, MD;  Location: [facility] INVASIVE CV LAB;  Service: Cardiovascular;  Laterality: N/A;   skin cancer removal     TONSILLECTOMY     WISDOM TOOTH EXTRACTION      Family History  Problem Relation Age of Onset   Cancer Mother    Heart attack Father    Heart disease Father    Sleep apnea Father    Colon cancer Neg Hx  Social History   Tobacco Use   Smoking status: Never   Smokeless tobacco: Never  Vaping Use   Vaping status: Never Used  Substance Use Topics   Alcohol use: Not Currently    Comment: rare   Drug use: Never     No Known Allergies  Review of Systems NEGATIVE UNLESS OTHERWISE INDICATED IN HPI      Objective:     BP (!) 100/58 (BP Location: Left Arm, Patient Position: Sitting, Cuff Size: Large)   Pulse 70   Temp (!) 97.2 F (36.2 C) (Temporal)   Ht 5' 7 (1.702 m)   Wt 237 lb 12.8 oz (107.9 kg)   SpO2 98%   BMI 37.24 kg/m   Wt Readings from Last 3 Encounters:   01/22/24 237 lb 12.8 oz (107.9 kg)  01/21/24 236 lb (107 kg)  01/20/24 239 lb 6.4 oz (108.6 kg)    BP Readings from Last 3 Encounters:  01/22/24 (!) 100/58  01/21/24 123/75  01/20/24 94/64     Physical Exam Vitals and nursing note reviewed.  Constitutional:      Appearance: Normal appearance. He is obese.  Cardiovascular:     Rate and Rhythm: Normal rate and regular rhythm.  Pulmonary:     Effort: Pulmonary effort is normal.     Breath sounds: Normal breath sounds.  Skin:    General: Skin is warm.  Neurological:     General: No focal deficit present.     Mental Status: He is alert and oriented to person, place, and time.  Psychiatric:        Mood and Affect: Mood normal.             Shawntelle Ungar M Izzy Doubek, PA-C

## 2024-01-23 NOTE — Telephone Encounter (Signed)
Please see pt msg regarding lab results and advise

## 2024-01-24 ENCOUNTER — Other Ambulatory Visit (HOSPITAL_BASED_OUTPATIENT_CLINIC_OR_DEPARTMENT_OTHER): Payer: Self-pay

## 2024-01-24 MED ORDER — COMIRNATY 30 MCG/0.3ML IM SUSY
0.3000 mL | PREFILLED_SYRINGE | Freq: Once | INTRAMUSCULAR | 0 refills | Status: AC
  Filled 2024-01-24: qty 0.3, 1d supply, fill #0

## 2024-01-26 ENCOUNTER — Other Ambulatory Visit: Payer: Self-pay | Admitting: Physician Assistant

## 2024-01-26 MED ORDER — METFORMIN HCL ER 500 MG PO TB24: 500.0000 mg | ORAL_TABLET | Freq: Every day | ORAL | 2 refills | Status: DC

## 2024-01-27 ENCOUNTER — Ambulatory Visit: Payer: Self-pay | Admitting: Physician Assistant

## 2024-02-14 NOTE — Telephone Encounter (Signed)
 Repatha  AP approved see other encounter.

## 2024-02-19 ENCOUNTER — Telehealth: Payer: Self-pay | Admitting: Pharmacy Technician

## 2024-02-19 ENCOUNTER — Other Ambulatory Visit (HOSPITAL_COMMUNITY): Payer: Self-pay

## 2024-02-19 NOTE — Telephone Encounter (Signed)
 Test claim prefers Praluent  Waiting to see if office is alright with switching to praluent

## 2024-02-19 NOTE — Telephone Encounter (Signed)
Message sent to Pharm D. 

## 2024-02-21 ENCOUNTER — Other Ambulatory Visit (HOSPITAL_COMMUNITY): Payer: Self-pay

## 2024-02-21 ENCOUNTER — Telehealth: Payer: Self-pay | Admitting: Pharmacy Technician

## 2024-02-21 NOTE — Telephone Encounter (Signed)
 Pharmacy Patient Advocate Encounter   Received notification from Physician's Office that prior authorization for praluent is required/requested.   Insurance verification completed.   The patient is insured through [employer].   Per test claim: PA required; PA submitted to above mentioned insurance via Latent Key/confirmation #/[facility] Status is pending

## 2024-02-25 NOTE — Telephone Encounter (Signed)
 Hi, insurance now prefers praluent over repatha . Prior authorization for praluent is approved from 02/25/24-08/23/24. Can someone please send the praluent prescription in to his pharmacy to replace the repatha ? Thank you   Pharmacy Patient Advocate Encounter  Received notification from [facility] that Prior Authorization for praluent has been APPROVED from 02/25/24 to 08/23/24   PA #/Case ID/Reference #: [id number]

## 2024-02-26 ENCOUNTER — Other Ambulatory Visit (HOSPITAL_BASED_OUTPATIENT_CLINIC_OR_DEPARTMENT_OTHER): Payer: Self-pay

## 2024-03-02 ENCOUNTER — Encounter: Payer: Self-pay | Admitting: Pharmacist

## 2024-03-02 ENCOUNTER — Telehealth: Payer: Self-pay | Admitting: Pharmacy Technician

## 2024-03-02 ENCOUNTER — Other Ambulatory Visit (HOSPITAL_COMMUNITY): Payer: Self-pay

## 2024-03-02 ENCOUNTER — Other Ambulatory Visit (HOSPITAL_BASED_OUTPATIENT_CLINIC_OR_DEPARTMENT_OTHER): Payer: Self-pay

## 2024-03-02 MED ORDER — PRALUENT 150 MG/ML ~~LOC~~ SOAJ
150.0000 mg | SUBCUTANEOUS | 11 refills | Status: AC
  Filled 2024-03-02: qty 2, 28d supply, fill #0
  Filled 2024-03-28: qty 2, 28d supply, fill #1
  Filled 2024-05-08: qty 2, 28d supply, fill #2
  Filled 2024-06-02: qty 2, 28d supply, fill #3

## 2024-03-02 NOTE — Addendum Note (Signed)
 Addended by: Keziyah Kneale D on: 03/02/2024 02:10 PM   Modules accepted: Orders

## 2024-03-02 NOTE — Telephone Encounter (Signed)
    In [place]

## 2024-03-03 ENCOUNTER — Other Ambulatory Visit: Payer: Self-pay

## 2024-03-03 ENCOUNTER — Encounter: Payer: Self-pay | Admitting: Physician Assistant

## 2024-03-03 MED ORDER — METFORMIN HCL ER 500 MG PO TB24: 500.0000 mg | ORAL_TABLET | Freq: Every day | ORAL | 0 refills | Status: DC

## 2024-03-19 ENCOUNTER — Ambulatory Visit (INDEPENDENT_AMBULATORY_CARE_PROVIDER_SITE_OTHER): Payer: PRIVATE HEALTH INSURANCE | Admitting: Internal Medicine

## 2024-03-19 ENCOUNTER — Encounter (INDEPENDENT_AMBULATORY_CARE_PROVIDER_SITE_OTHER): Payer: Self-pay | Admitting: Internal Medicine

## 2024-03-19 VITALS — BP 106/73 | HR 67 | Temp 98.7°F | Ht 67.0 in | Wt 232.0 lb

## 2024-03-19 DIAGNOSIS — R7303 Prediabetes: Secondary | ICD-10-CM | POA: Diagnosis not present

## 2024-03-19 DIAGNOSIS — G4733 Obstructive sleep apnea (adult) (pediatric): Secondary | ICD-10-CM

## 2024-03-19 DIAGNOSIS — I5042 Chronic combined systolic (congestive) and diastolic (congestive) heart failure: Secondary | ICD-10-CM | POA: Diagnosis not present

## 2024-03-19 DIAGNOSIS — E88819 Insulin resistance, unspecified: Secondary | ICD-10-CM

## 2024-03-19 DIAGNOSIS — I110 Hypertensive heart disease with heart failure: Secondary | ICD-10-CM | POA: Diagnosis not present

## 2024-03-19 DIAGNOSIS — E66812 Obesity, class 2: Secondary | ICD-10-CM

## 2024-03-19 DIAGNOSIS — Z6836 Body mass index (BMI) 36.0-36.9, adult: Secondary | ICD-10-CM

## 2024-03-19 DIAGNOSIS — I10 Essential (primary) hypertension: Secondary | ICD-10-CM | POA: Insufficient documentation

## 2024-03-19 NOTE — Assessment & Plan Note (Signed)
 Weight: decrease of 27 lb (10.4%) over 5 months  Start: 10/17/2023 259 lb (117.5 kg)  End: 03/19/2024 232 lb (105.2 kg)  Obesity with insulin  resistance and prediabetes, managed with lifestyle modifications and metformin . He has lost 27 pounds over six months, indicating good adherence to a 1500 calorie nutrition plan and regular exercise. Fasting glucose is 108 mg/dL, and J8r is 4.0%. He reports persistent hunger despite adequate protein intake, suggesting a need for increased fiber and protein consumption. Metformin  is well-tolerated without side effects. Discussed the benefits of metformin , including its role in increasing satiety hormones, reducing cravings, stabilizing blood sugar, and improving gut microbiome. He prefers to maintain the current dose of metformin  for now. - Continue 1500 calorie nutrition plan and regular exercise. - Increase protein and fiber intake to enhance satiety. - Continue metformin  500 mg extended release once daily. - Monitor A1c and fasting glucose levels. - Consider increasing metformin  dose if A1c does not improve after four months.

## 2024-03-19 NOTE — Assessment & Plan Note (Signed)
 He has an echocardiogram from 2024 that shows an ejection fraction between 35 and 40% and diastolic dysfunction grade 2.  He has been making good progress with his weight loss and they have stopped his metoprolol  he continues on Entresto  but has been feeling some orthostasis on this medication.  He continues to work with cardiology for medication adjustments.He is considering reducing Entresto  to once daily, pending cardiologist's input. Emphasized the importance of balancing medication benefits with quality of life. - Continue to monitor symptoms and adjust Entresto  dosage as needed. - Discuss potential reduction of Entresto  with cardiologist. - Will schedule echocardiogram in March to assess heart function.

## 2024-03-19 NOTE — Assessment & Plan Note (Signed)
 Vitals:   03/19/24 1000  BP: 106/73    Blood pressure is at goal for age and risk category.  On Entresto  without adverse effects.  Metoprolol  has been discontinued due to low blood pressure.  Most recent renal parameters reviewed which showed stable electrolytes and kidney function.  Continue with weight loss therapy. Losing 10% may improve blood pressure control. Monitor for symptoms of orthostasis while losing weight. Continue current regimen and home monitoring for a goal blood pressure of < 120/80.

## 2024-03-19 NOTE — Progress Notes (Signed)
 Office: [phone]  /  Fax: [phone]  Weight Summary and Body Composition Analysis (BIA)  Vitals Temp: 98.7 F (37.1 C) BP: 106/73 Pulse Rate: 67 SpO2: 97 %   Anthropometric Measurements Height: 5' 7 (1.702 m) Weight: 232 lb (105.2 kg) BMI (Calculated): 36.33 Weight at Last Visit: 236 lb Weight Lost Since Last Visit: 4 lb Weight Gained Since Last Visit: 0 lb Starting Weight: 252 lb Total Weight Loss (lbs): 20 lb (9.072 kg) Peak Weight: 262 lb   Body Composition  Body Fat %: 31.6 % Fat Mass (lbs): 73.6 lbs Muscle Mass (lbs): 151.2 lbs Total Body Water  (lbs): 112.4 lbs Visceral Fat Rating : 20    RMR: 2045  Today's Visit #: 5  Starting Date: 11/11/23   Subjective   Chief Complaint: Obesity  Interval History Discussed the use of AI scribe software for clinical note transcription with the patient, who gave verbal consent to proceed.  History of Present Illness Keith Franklin is a 63 year old male who presents for medical weight management.  He has lost four pounds since his last visit and adheres to a 1500 calorie nutrition plan. He exercises five days a week for sixty minutes and has increased his strength training routine by 50% in weights, focusing on muscle building with three sets of ten reps.  He has a history of low blood pressure, which led to the discontinuation of metoprolol  two months ago. His blood pressure had dropped to 90/53, causing lightheadedness. Since stopping metoprolol , his blood pressure has stabilized around 123/80.  Despite being off added sugars for six months, his glucose levels have been slightly elevated, with a fasting glucose of 108. He started metformin  500 mg extended release without side effects. He feels hungry throughout the day despite eating meals and manages this by tracking protein intake and increasing fiber and protein in his diet.  He experiences dizziness and near-fainting episodes, particularly during  certain exercises and activities like lifting mulch bags. He attributes this to his current medication regimen, which includes Entresto . He has lost 27 pounds since July.  He has a history of high cholesterol and has been on Repatha , which effectively lowered his cholesterol levels. He previously experienced high CK levels with statins, leading to their discontinuation. His HDL levels have decreased, which he attributes to insulin  resistance.  His A1c was recently measured at 5.9, and he has high insulin  levels. He manages his diet by limiting carbohydrates to about 120 grams per day and focusing on whole grains.     Challenges affecting patient progress: none.    Pharmacotherapy for weight management: He is currently taking Metformin  (off label use for weight management and / or insulin  resistance and / or diabetes prevention) with adequate clinical response  and without side effects..   Assessment and Plan   Treatment Plan For Obesity:  Recommended Dietary Goals  Keith Franklin is currently in the action stage of change. As such, his goal is to continue weight management plan. He has agreed to: continue current plan  Behavioral Health and Counseling  We discussed the following behavioral modification strategies today: work on tracking and journaling calories using tracking application, continue to work on maintaining a reduced calorie state, getting the recommended amount of protein, incorporating whole foods, making healthy choices, staying well hydrated and practicing mindfulness when eating., and increase protein intake, fibrous foods (25 grams per day for women, 30 grams for men) and water  to improve satiety and decrease hunger signals. .  Additional  education and resources provided today: Handout on traveling and holiday eating strategies  Recommended Physical Activity Goals  Keith Franklin has been advised to work up to 150 minutes of moderate intensity aerobic activity a week and  strengthening exercises 2-3 times per week for cardiovascular health, weight loss maintenance and preservation of muscle mass.  He has agreed to :  Continue to gradually increase the amount and intensity of exercise routine, Increase volume of physical activity to a goal of 240 minutes a week, and Combine aerobic and strengthening exercises for efficiency and improved cardiometabolic health.  Medical Interventions and Pharmacotherapy  We discussed various medication options to help Keith Franklin with his weight loss efforts and we both agreed to : Continue metformin for diabetes prevention and weight management  Associated Conditions Impacted by Obesity Treatment  Assessment & Plan Prediabetes Insulin  resistance Class 2 severe obesity with serious comorbidity and body mass index (BMI) of 36.0 to 36.9 in adult, unspecified obesity type Weight: decrease of 27 lb (10.4%) over 5 months  Start: 10/17/2023 259 lb (117.5 kg)  End: 03/19/2024 232 lb (105.2 kg)  Obesity with insulin  resistance and prediabetes, managed with lifestyle modifications and metformin. He has lost 27 pounds over six months, indicating good adherence to a 1500 calorie nutrition plan and regular exercise. Fasting glucose is 108 mg/dL, and J8r is 4.0%. He reports persistent hunger despite adequate protein intake, suggesting a need for increased fiber and protein consumption. Metformin is well-tolerated without side effects. Discussed the benefits of metformin, including its role in increasing satiety hormones, reducing cravings, stabilizing blood sugar, and improving gut microbiome. He prefers to maintain the current dose of metformin for now. - Continue 1500 calorie nutrition plan and regular exercise. - Increase protein and fiber intake to enhance satiety. - Continue metformin 500 mg extended release once daily. - Monitor A1c and fasting glucose levels. - Consider increasing metformin dose if A1c does not improve after four  months. Essential hypertension Vitals:   03/19/24 1000  BP: 106/73    Blood pressure is at goal for age and risk category.  On Entresto  without adverse effects.  Metoprolol  has been discontinued due to low blood pressure.  Most recent renal parameters reviewed which showed stable electrolytes and kidney function.  Continue with weight loss therapy. Losing 10% may improve blood pressure control. Monitor for symptoms of orthostasis while losing weight. Continue current regimen and home monitoring for a goal blood pressure of < 120/80.   OSA (obstructive sleep apnea) - moderate,  AHI 16, PSMG 2022 On CPAP with reported good compliance. Continue PAP therapy.  He has lost approximately 10% of total body weight.  Losing 15% may reduce AHI.  Continue current weight management strategy.  He may also benefit from treatment with GLP-1 in the future.  Chronic combined systolic and diastolic heart failure (HCC) He has an echocardiogram from 2024 that shows an ejection fraction between 35 and 40% and diastolic dysfunction grade 2.  He has been making good progress with his weight loss and they have stopped his metoprolol  he continues on Entresto  but has been feeling some orthostasis on this medication.  He continues to work with cardiology for medication adjustments.He is considering reducing Entresto  to once daily, pending cardiologist's input. Emphasized the importance of balancing medication benefits with quality of life. - Continue to monitor symptoms and adjust Entresto  dosage as needed. - Discuss potential reduction of Entresto  with cardiologist. - Will schedule echocardiogram in March to assess heart function.  Objective   Physical Exam:  Blood pressure 106/73, pulse 67, temperature 98.7 F (37.1 C), height 5' 7 (1.702 m), weight 232 lb (105.2 kg), SpO2 97%. Body mass index is 36.34 kg/m.  General: He is overweight, cooperative, alert, well developed, and in no acute  distress. PSYCH: Has normal mood, affect and thought process.   HEENT: EOMI, sclerae are anicteric. Lungs: Normal breathing effort, no conversational dyspnea. Extremities: No edema.  Neurologic: No gross sensory or motor deficits. No tremors or fasciculations noted.    Diagnostic Data Reviewed:  BMET    Component Value Date/Time   NA 139 01/22/2024 0958   NA 140 11/04/2023 0917   K 4.6 01/22/2024 0958   CL 105 01/22/2024 0958   CO2 28 01/22/2024 0958   GLUCOSE 106 (H) 01/22/2024 0958   BUN 20 01/22/2024 0958   BUN 17 11/04/2023 0917   CREATININE 1.07 01/22/2024 0958   CALCIUM  9.6 01/22/2024 0958   GFRNONAA >60 03/13/2022 1623   Lab Results  Component Value Date   HGBA1C 5.9 01/22/2024   HGBA1C 5.9 (H) 09/30/2020   Lab Results  Component Value Date   INSULIN  29.4 (H) 11/11/2023   Lab Results  Component Value Date   TSH 2.59 08/22/2023   CBC    Component Value Date/Time   WBC 8.5 10/17/2023 1554   RBC 4.77 10/17/2023 1554   HGB 14.8 10/17/2023 1554   HGB 15.6 08/21/2022 0940   HCT 44.2 10/17/2023 1554   HCT 46.7 08/21/2022 0940   PLT 245.0 10/17/2023 1554   PLT 239 08/21/2022 0940   MCV 92.8 10/17/2023 1554   MCV 95 08/21/2022 0940   MCH 31.6 08/21/2022 0940   MCH 31.7 03/13/2022 1623   MCHC 33.5 10/17/2023 1554   RDW 13.4 10/17/2023 1554   RDW 12.5 08/21/2022 0940   Iron Studies No results found for: IRON, TIBC, FERRITIN, IRONPCTSAT Lipid Panel     Component Value Date/Time   CHOL 88 01/22/2024 0958   CHOL 189 11/04/2023 0917   TRIG 44.0 01/22/2024 0958   HDL 38.10 (L) 01/22/2024 0958   HDL 41 11/04/2023 0917   CHOLHDL 2 01/22/2024 0958   VLDL 8.8 01/22/2024 0958   LDLCALC 41 01/22/2024 0958   LDLCALC 132 (H) 11/04/2023 0917   LDLCALC 32 01/13/2021 1516   Hepatic Function Panel     Component Value Date/Time   PROT 6.8 01/22/2024 0958   PROT 6.5 11/04/2023 0917   ALBUMIN  4.3 01/22/2024 0958   ALBUMIN  4.2 11/04/2023 0917   AST 16  01/22/2024 0958   ALT 13 01/22/2024 0958   ALKPHOS 50 01/22/2024 0958   BILITOT 0.6 01/22/2024 0958   BILITOT 0.6 11/04/2023 0917   BILIDIR 0.20 11/20/2022 0838   IBILI 0.4 09/30/2020 1525      Component Value Date/Time   TSH 2.59 08/22/2023 1024   Nutritional Lab Results  Component Value Date   VD25OH 22.9 (L) 11/11/2023    Medications: Outpatient Encounter Medications as of 03/19/2024  Medication Sig Note   acetaminophen  (TYLENOL ) 500 MG tablet Take 1,000 mg by mouth every 6 (six) hours as needed for moderate pain.    Alirocumab  (PRALUENT ) 150 MG/ML SOAJ Inject 1 mL (150 mg total) into the skin every 14 (fourteen) days.    aspirin  EC 81 MG EC tablet Take 1 tablet (81 mg total) by mouth daily. Swallow whole.    Cholecalciferol (VITAMIN D3) 50 MCG (2000 UT) capsule Take 1 capsule (2,000 Units total) by mouth daily.  diphenhydramine -acetaminophen  (TYLENOL  PM) 25-500 MG TABS tablet Take 1 tablet by mouth at bedtime. 09/11/2023: On hold   Famotidine  (PEPCID  PO) Take 1 tablet by mouth daily at 6 (six) AM.    metFORMIN (GLUCOPHAGE-XR) 500 MG 24 hr tablet Take 1 tablet (500 mg total) by mouth daily with supper.    metroNIDAZOLE  (METROCREAM ) 0.75 % cream Apply topically 2 (two) times daily. (Patient taking differently: Apply topically 2 (two) times daily. Using PRN)    nitroGLYCERIN  (NITROSTAT ) 0.4 MG SL tablet Place 0.4 mg under the tongue every 5 (five) minutes as needed for chest pain. 01/20/2024: Has if needed   sacubitril -valsartan  (ENTRESTO ) 24-26 MG Take 1 tablet by mouth 2 (two) times daily.    tamsulosin (FLOMAX) 0.4 MG CAPS capsule Take 0.4 mg by mouth daily.    [DISCONTINUED] METOPROLOL  SUCCINATE PO Take 25 mg by mouth daily. (Patient not taking: Reported on 01/22/2024)    No facility-administered encounter medications on file as of 03/19/2024.     Follow-Up   Return in about 3 months (around 06/19/2024) for March 2026.SABRA He was informed of the importance of frequent follow up  visits to maximize his success with intensive lifestyle modifications for his multiple health conditions.  Attestation Statement   Reviewed by clinician on day of visit: allergies, medications, problem list, medical history, surgical history, family history, social history, and previous encounter notes.     Keith Parker, MD

## 2024-03-19 NOTE — Assessment & Plan Note (Signed)
 On CPAP with reported good compliance. Continue PAP therapy.  He has lost approximately 10% of total body weight.  Losing 15% may reduce AHI.  Continue current weight management strategy.  He may also benefit from treatment with GLP-1 in the future.

## 2024-03-28 ENCOUNTER — Other Ambulatory Visit (HOSPITAL_BASED_OUTPATIENT_CLINIC_OR_DEPARTMENT_OTHER): Payer: Self-pay

## 2024-04-23 ENCOUNTER — Other Ambulatory Visit: Payer: Self-pay | Admitting: Physician Assistant

## 2024-05-08 ENCOUNTER — Other Ambulatory Visit (HOSPITAL_BASED_OUTPATIENT_CLINIC_OR_DEPARTMENT_OTHER): Payer: Self-pay

## 2024-07-15 ENCOUNTER — Ambulatory Visit (HOSPITAL_COMMUNITY): Payer: Self-pay

## 2024-07-21 ENCOUNTER — Ambulatory Visit: Admitting: Physician Assistant

## 2024-07-23 ENCOUNTER — Ambulatory Visit (INDEPENDENT_AMBULATORY_CARE_PROVIDER_SITE_OTHER): Admitting: Internal Medicine
